# Patient Record
Sex: Female | Born: 1985 | Race: Black or African American | Hispanic: No | Marital: Single | Smoking: Current every day smoker
Health system: Southern US, Community
[De-identification: ages and names within clinical notes are randomized; demographics above are authoritative.]

## PROBLEM LIST (undated history)

## (undated) ENCOUNTER — Ambulatory Visit

## (undated) ENCOUNTER — Ambulatory Visit: Payer: No Typology Code available for payment source | Source: Home / Self Care

## (undated) DIAGNOSIS — R569 Unspecified convulsions: Secondary | ICD-10-CM

## (undated) DIAGNOSIS — F1910 Other psychoactive substance abuse, uncomplicated: Secondary | ICD-10-CM

## (undated) DIAGNOSIS — K2920 Alcoholic gastritis without bleeding: Secondary | ICD-10-CM

## (undated) DIAGNOSIS — K920 Hematemesis: Secondary | ICD-10-CM

## (undated) DIAGNOSIS — F4329 Adjustment disorder with other symptoms: Secondary | ICD-10-CM

## (undated) DIAGNOSIS — E872 Acidosis: Secondary | ICD-10-CM

## (undated) DIAGNOSIS — K259 Gastric ulcer, unspecified as acute or chronic, without hemorrhage or perforation: Secondary | ICD-10-CM

## (undated) DIAGNOSIS — N12 Tubulo-interstitial nephritis, not specified as acute or chronic: Secondary | ICD-10-CM

## (undated) HISTORY — DX: Other psychoactive substance abuse, uncomplicated: F19.10

## (undated) HISTORY — DX: Tubulo-interstitial nephritis, not specified as acute or chronic: N12

## (undated) HISTORY — PX: MOUTH SURGERY: SHX715

## (undated) HISTORY — DX: Adjustment disorder with other symptoms: F43.29

## (undated) HISTORY — DX: Hematemesis: K92.0

## (undated) HISTORY — DX: Alcoholic gastritis without bleeding: K29.20

## (undated) HISTORY — DX: Acidosis: E87.2

## (undated) HISTORY — PX: BREAST LUMPECTOMY: SHX2

---
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
## 2006-02-17 ENCOUNTER — Emergency Department (HOSPITAL_COMMUNITY): Admission: EM | Admit: 2006-02-17 | Discharge: 2006-02-18 | Payer: Self-pay | Admitting: Emergency Medicine

## 2006-06-27 ENCOUNTER — Emergency Department (HOSPITAL_COMMUNITY): Admission: EM | Admit: 2006-06-27 | Discharge: 2006-06-27 | Payer: Self-pay | Admitting: Family Medicine

## 2006-07-14 ENCOUNTER — Emergency Department: Payer: Self-pay | Admitting: Emergency Medicine

## 2007-01-26 ENCOUNTER — Emergency Department (HOSPITAL_COMMUNITY): Admission: EM | Admit: 2007-01-26 | Discharge: 2007-01-26 | Payer: Self-pay | Admitting: Emergency Medicine

## 2007-02-01 ENCOUNTER — Inpatient Hospital Stay (HOSPITAL_COMMUNITY): Admission: AD | Admit: 2007-02-01 | Discharge: 2007-02-01 | Payer: Self-pay | Admitting: Obstetrics and Gynecology

## 2007-04-12 ENCOUNTER — Emergency Department (HOSPITAL_COMMUNITY): Admission: EM | Admit: 2007-04-12 | Discharge: 2007-04-12 | Payer: Self-pay | Admitting: Emergency Medicine

## 2008-02-16 ENCOUNTER — Emergency Department (HOSPITAL_COMMUNITY): Admission: EM | Admit: 2008-02-16 | Discharge: 2008-02-16 | Payer: Self-pay | Admitting: Emergency Medicine

## 2008-02-19 ENCOUNTER — Emergency Department (HOSPITAL_COMMUNITY): Admission: EM | Admit: 2008-02-19 | Discharge: 2008-02-20 | Payer: Self-pay | Admitting: Emergency Medicine

## 2008-08-29 ENCOUNTER — Emergency Department (HOSPITAL_COMMUNITY): Admission: EM | Admit: 2008-08-29 | Discharge: 2008-08-29 | Payer: Self-pay | Admitting: Emergency Medicine

## 2008-12-17 ENCOUNTER — Emergency Department (HOSPITAL_COMMUNITY): Admission: EM | Admit: 2008-12-17 | Discharge: 2008-12-17 | Payer: Self-pay | Admitting: Emergency Medicine

## 2009-01-03 ENCOUNTER — Emergency Department (HOSPITAL_COMMUNITY): Admission: EM | Admit: 2009-01-03 | Discharge: 2009-01-03 | Payer: Self-pay | Admitting: Emergency Medicine

## 2009-01-05 ENCOUNTER — Emergency Department (HOSPITAL_COMMUNITY): Admission: EM | Admit: 2009-01-05 | Discharge: 2009-01-06 | Payer: Self-pay | Admitting: Emergency Medicine

## 2009-07-10 ENCOUNTER — Emergency Department (HOSPITAL_COMMUNITY): Admission: EM | Admit: 2009-07-10 | Discharge: 2009-07-10 | Payer: Self-pay | Admitting: Emergency Medicine

## 2009-07-14 ENCOUNTER — Emergency Department (HOSPITAL_COMMUNITY): Admission: EM | Admit: 2009-07-14 | Discharge: 2009-07-14 | Payer: Self-pay | Admitting: Emergency Medicine

## 2009-12-10 ENCOUNTER — Emergency Department (HOSPITAL_COMMUNITY): Admission: EM | Admit: 2009-12-10 | Discharge: 2009-12-10 | Payer: Self-pay | Admitting: Emergency Medicine

## 2010-03-27 DIAGNOSIS — N12 Tubulo-interstitial nephritis, not specified as acute or chronic: Secondary | ICD-10-CM

## 2010-03-27 HISTORY — DX: Tubulo-interstitial nephritis, not specified as acute or chronic: N12

## 2010-06-10 ENCOUNTER — Emergency Department (HOSPITAL_COMMUNITY): Admission: EM | Admit: 2010-06-10 | Discharge: 2010-06-10 | Payer: Self-pay | Admitting: Emergency Medicine

## 2010-07-22 ENCOUNTER — Emergency Department (HOSPITAL_COMMUNITY): Admission: EM | Admit: 2010-07-22 | Discharge: 2010-07-22 | Payer: Self-pay | Admitting: Emergency Medicine

## 2011-03-13 LAB — DIFFERENTIAL
Basophils Absolute: 0.1 10*3/uL (ref 0.0–0.1)
Basophils Relative: 1 % (ref 0–1)
Eosinophils Absolute: 0.1 10*3/uL (ref 0.0–0.7)
Eosinophils Relative: 2 % (ref 0–5)
Lymphocytes Relative: 29 % (ref 12–46)
Lymphs Abs: 2.1 10*3/uL (ref 0.7–4.0)
Monocytes Absolute: 0.6 10*3/uL (ref 0.1–1.0)
Monocytes Relative: 8 % (ref 3–12)
Neutro Abs: 4.4 10*3/uL (ref 1.7–7.7)
Neutrophils Relative %: 61 % (ref 43–77)

## 2011-03-13 LAB — CBC
HCT: 34 % — ABNORMAL LOW (ref 36.0–46.0)
Hemoglobin: 11.3 g/dL — ABNORMAL LOW (ref 12.0–15.0)
MCH: 32.4 pg (ref 26.0–34.0)
MCHC: 33.3 g/dL (ref 30.0–36.0)
MCV: 97.4 fL (ref 78.0–100.0)
Platelets: 277 10*3/uL (ref 150–400)
RBC: 3.49 MIL/uL — ABNORMAL LOW (ref 3.87–5.11)
RDW: 13.4 % (ref 11.5–15.5)
WBC: 7.3 10*3/uL (ref 4.0–10.5)

## 2011-03-13 LAB — COMPREHENSIVE METABOLIC PANEL
ALT: 13 U/L (ref 0–35)
AST: 19 U/L (ref 0–37)
Albumin: 3.9 g/dL (ref 3.5–5.2)
Alkaline Phosphatase: 65 U/L (ref 39–117)
BUN: 7 mg/dL (ref 6–23)
CO2: 23 mEq/L (ref 19–32)
Calcium: 8.7 mg/dL (ref 8.4–10.5)
Chloride: 107 mEq/L (ref 96–112)
Creatinine, Ser: 0.62 mg/dL (ref 0.4–1.2)
GFR calc Af Amer: >60 mL/min (ref >60)
GFR calc non Af Amer: >60 mL/min (ref >60)
Glucose, Bld: 76 mg/dL (ref 70–99)
Potassium: 3.2 mEq/L — ABNORMAL LOW (ref 3.5–5.1)
Sodium: 137 mEq/L (ref 135–145)
Total Bilirubin: 0.7 mg/dL (ref 0.3–1.2)
Total Protein: 7.2 g/dL (ref 6.0–8.3)

## 2011-03-13 LAB — POCT PREGNANCY, URINE: Preg Test, Ur: NEGATIVE

## 2011-03-30 LAB — URINALYSIS, ROUTINE W REFLEX MICROSCOPIC
Bilirubin Urine: NEGATIVE
Glucose, UA: NEGATIVE mg/dL
Hgb urine dipstick: NEGATIVE
Ketones, ur: NEGATIVE mg/dL
Nitrite: NEGATIVE
Protein, ur: NEGATIVE mg/dL
Specific Gravity, Urine: 1.012 (ref 1.005–1.030)
Urobilinogen, UA: 0.2 mg/dL (ref 0.0–1.0)
pH: 7.5 (ref 5.0–8.0)

## 2011-04-04 LAB — COMPREHENSIVE METABOLIC PANEL
ALT: 20 U/L (ref 0–35)
AST: 22 U/L (ref 0–37)
Albumin: 3.9 g/dL (ref 3.5–5.2)
Alkaline Phosphatase: 67 U/L (ref 39–117)
BUN: 10 mg/dL (ref 6–23)
CO2: 25 mEq/L (ref 19–32)
Calcium: 8.8 mg/dL (ref 8.4–10.5)
Chloride: 106 mEq/L (ref 96–112)
Creatinine, Ser: 0.55 mg/dL (ref 0.4–1.2)
GFR calc Af Amer: >60 mL/min (ref >60)
GFR calc non Af Amer: >60 mL/min (ref >60)
Glucose, Bld: 96 mg/dL (ref 70–99)
Potassium: 3.9 mEq/L (ref 3.5–5.1)
Sodium: 135 mEq/L (ref 135–145)
Total Bilirubin: 0.4 mg/dL (ref 0.3–1.2)
Total Protein: 6.9 g/dL (ref 6.0–8.3)

## 2011-04-04 LAB — PREGNANCY, URINE: Preg Test, Ur: NEGATIVE

## 2011-04-04 LAB — URINALYSIS, ROUTINE W REFLEX MICROSCOPIC
Bilirubin Urine: NEGATIVE
Glucose, UA: NEGATIVE mg/dL
Hgb urine dipstick: NEGATIVE
Ketones, ur: NEGATIVE mg/dL
Nitrite: NEGATIVE
Protein, ur: NEGATIVE mg/dL
Specific Gravity, Urine: 1.028 (ref 1.005–1.030)
Urobilinogen, UA: 0.2 mg/dL (ref 0.0–1.0)
pH: 6.5 (ref 5.0–8.0)

## 2011-04-04 LAB — ACETAMINOPHEN LEVEL: Acetaminophen (Tylenol), Serum: <10 ug/mL — ABNORMAL LOW (ref 10–30)

## 2011-04-12 LAB — COMPREHENSIVE METABOLIC PANEL
ALT: 21 U/L (ref 0–35)
AST: 22 U/L (ref 0–37)
Albumin: 4 g/dL (ref 3.5–5.2)
Albumin: 4.5 g/dL (ref 3.5–5.2)
Alkaline Phosphatase: 56 U/L (ref 39–117)
Alkaline Phosphatase: 71 U/L (ref 39–117)
BUN: 12 mg/dL (ref 6–23)
BUN: 6 mg/dL (ref 6–23)
CO2: 25 mEq/L (ref 19–32)
Calcium: 8.8 mg/dL (ref 8.4–10.5)
Calcium: 9.4 mg/dL (ref 8.4–10.5)
Chloride: 97 mEq/L (ref 96–112)
Creatinine, Ser: 0.77 mg/dL (ref 0.4–1.2)
GFR calc Af Amer: >60 mL/min (ref >60)
GFR calc non Af Amer: >60 mL/min (ref >60)
Glucose, Bld: 76 mg/dL (ref 70–99)
Potassium: 3 mEq/L — ABNORMAL LOW (ref 3.5–5.1)
Potassium: 3.8 mEq/L (ref 3.5–5.1)
Sodium: 136 mEq/L (ref 135–145)
Sodium: 136 mEq/L (ref 135–145)
Total Bilirubin: 1 mg/dL (ref 0.3–1.2)
Total Protein: 6.6 g/dL (ref 6.0–8.3)
Total Protein: 7.8 g/dL (ref 6.0–8.3)

## 2011-04-12 LAB — URINALYSIS, ROUTINE W REFLEX MICROSCOPIC
Glucose, UA: NEGATIVE mg/dL
Glucose, UA: NEGATIVE mg/dL
Ketones, ur: >80 mg/dL — AB
Leukocytes, UA: NEGATIVE
Leukocytes, UA: NEGATIVE
Nitrite: NEGATIVE
Specific Gravity, Urine: 1.021 (ref 1.005–1.030)
Specific Gravity, Urine: 1.041 — ABNORMAL HIGH (ref 1.005–1.030)
pH: 6 (ref 5.0–8.0)
pH: 7 (ref 5.0–8.0)

## 2011-04-12 LAB — DIFFERENTIAL
Basophils Relative: 1 % (ref 0–1)
Lymphocytes Relative: 20 % (ref 12–46)
Lymphs Abs: 1.5 10*3/uL (ref 0.7–4.0)
Lymphs Abs: 2.2 10*3/uL (ref 0.7–4.0)
Monocytes Absolute: 0.3 10*3/uL (ref 0.1–1.0)
Monocytes Absolute: 0.5 10*3/uL (ref 0.1–1.0)
Monocytes Relative: 7 % (ref 3–12)
Monocytes Relative: 7 % (ref 3–12)
Neutro Abs: 2.3 10*3/uL (ref 1.7–7.7)
Neutro Abs: 5.5 10*3/uL (ref 1.7–7.7)

## 2011-04-12 LAB — CBC
HCT: 43.8 % (ref 36.0–46.0)
Hemoglobin: 14.8 g/dL (ref 12.0–15.0)
MCHC: 32.7 g/dL (ref 30.0–36.0)
MCHC: 33.8 g/dL (ref 30.0–36.0)
MCV: 94.5 fL (ref 78.0–100.0)
Platelets: 233 10*3/uL (ref 150–400)
Platelets: 263 10*3/uL (ref 150–400)
RBC: 4.63 MIL/uL (ref 3.87–5.11)
RDW: 13.1 % (ref 11.5–15.5)
RDW: 13.5 % (ref 11.5–15.5)
WBC: 7.7 10*3/uL (ref 4.0–10.5)

## 2011-04-12 LAB — URINE MICROSCOPIC-ADD ON

## 2011-05-14 NOTE — Discharge Summary (Signed)
[name], [name]             ACCOUNT NO.:  [id number]   MEDICAL RECORD NO.:  [id number]          PATIENT TYPE:  INP   LOCATION:  9101                          FACILITY:  [facility]   PHYSICIAN:  Dineen Kid. Rana Snare, M.D.    DATE OF BIRTH:  [birth year]   DATE OF ADMISSION:  11/13/2004  DATE OF DISCHARGE:  11/16/2004                                 DISCHARGE SUMMARY   ADMITTING DIAGNOSES:  1.  Intrauterine pregnancy at 86 and five-sevenths weeks estimated      gestational age.  2.  Transverse lie.  3.  Labor.   DISCHARGE DIAGNOSES:  1.  Status post low transverse cesarean section.  2.  Viable female infant.   PROCEDURE:  Primary low transverse cesarean section.   REASON FOR ADMISSION:  Please see written H&P.   HOSPITAL COURSE:  The patient was a 25 year old single black female  primigravida that presented to [facility] with right lower  quadrant and right flank pain.  The patient was found to be contracting  regularly.  PIH panel was drawn which was within normal limits.  Ultrasound  was performed which revealed a biophysical profile 8/8.  Fetal heart tones  revealed some accelerations but also noted to have some decelerations which  seemed to appear to be late in the contraction cycle.  Cervix was examined  which revealed a cervix that was closed, thick, and high.  Ultrasound also  revealed the fetus to be in the transverse lie position.  Decision was made  to proceed with a cesarean delivery.  The patient was then transferred to  the operating room where spinal anesthesia was administered without  difficulty.  A low transverse incision was made with the delivery of a  viable female infant weighing 6 pounds 0 ounces with Apgars of 7 at one minute  and 8 at five minutes.  Arterial cord pH was 7.11.  The patient tolerated  the procedure well and was taken to the recovery room in stable condition.  On postoperative day #1, the patient was without complaint.  Vital signs  were stable, she was afebrile.  Abdomen was soft, fundus was firm and  nontender.  Abdominal dressing was noted to be clean, dry, and intact.  Labs  revealed hemoglobin of 8.4 and wbc count of 13.3.  On postoperative day #2,  the patient remained afebrile.  Vital signs were stable.  Abdomen was soft.  Abdominal dressing had been removed revealing an incision that was clean,  dry, and intact.  She was ambulating well with good return of bowel  function.  On postoperative day #3, the patient was without complaint.  The  baby was in the NICU on CPAP.  Vital signs were stable.  Abdomen soft,  fundus was firm and nontender.  Incision was clean, dry, and intact.  Staples were removed and the patient was discharged home.   CONDITION ON DISCHARGE:  Good.   DIET:  Regular as tolerated.   ACTIVITY:  No heavy lifting, no driving x2 weeks, no vaginal entry.   FOLLOW-UP:  The patient is to follow up in the  office in 1 week for an  incision check.  She is to call for temperature greater than 100 degrees,  persistent nausea and vomiting, heavy vaginal bleeding, and/or redness or  drainage from the incisional site.   DISCHARGE MEDICATIONS:  1.  Tylox #30 one p.o. q.4-6h. p.r.n.  2.  Motrin 600 mg q.6h.  3.  Prenatal vitamins one p.o. daily.  4.  Colace one p.o. daily p.r.n.     Catalina Gravel   CC/MEDQ  D:  12/04/2004  T:  12/04/2004  Job:  [id number]

## 2011-05-14 NOTE — Op Note (Signed)
[name], [name]             ACCOUNT NO.:  [id number]   MEDICAL RECORD NO.:  [id number]          PATIENT TYPE:  INP   LOCATION:  9101                          [facility]ACILITY:  [facility]   PHYSICIAN:  Guy Sandifer. Tomblin II, M.D.DATE OF BIRTH:  03-Oct-1986   DATE OF PROCEDURE:  11/13/2004  DATE OF DISCHARGE:                                 OPERATIVE REPORT   PREOPERATIVE DIAGNOSES:  1.  Intrauterine pregnancy at 36 and five-sevenths weeks.  2.  Transverse lie.  3.  Labor.   POSTOPERATIVE DIAGNOSES:  1.  Intrauterine pregnancy at 36 and five-sevenths weeks.  2.  Transverse lie.  3.  Labor.   PROCEDURE:  1.  Low transverse cesarean section.  2.  ON-Q subfascial and subcutaneous.   SURGEON:  Harold Hedge, M.D.   ANESTHESIA:  Spinal - Cristela Blue M.D.   ESTIMATED BLOOD LOSS:  800 mL.   FINDINGS:  Viable female infant, Apgars of 7 and 9 at one and five minutes  respectively, birth weight pending, arterial cord pH 7.11.   INDICATIONS AND CONSENT:  This patient is a 25 year old single black female  G1 P0 with an EDC of December 05, 2004 who had a positive group B beta strep  this pregnancy.  She presents to triage with complaints of right lower  quadrant and right flank pain.  She is found to be contracting regularly.  PIH panel is normal.  Ultrasound with BPP is 8/8.  Fetal heart tones reveal  accelerations with some decelerations, some of which appear to be late.  Cervix is closed, thick, and high and ultrasound reveals transverse lie.  Diagnosis of labor with transverse lie is made and recommendation for  cesarean section is made.  Potential risks and complications are discussed  with the patient and her mother including but not limited to:  Infection;  bowel, bladder, ureteral damage; bleeding requiring transfusion or blood  products and possible transfusion reaction, HIV, and hepatitis acquisition;  DVT; PE; pneumonia.  All questions are answered and consent is signed on the   chart.   DESCRIPTION OF PROCEDURE:  The patient is taken to the operating room where  she is identified, spinal anesthetic was placed, and she is placed in the  dorsal supine position with a 15-degree left lateral wedge.  She is prepped  abdominally, Foley catheter is placed and the bladder is drained, and she is  draped in a sterile fashion.  After testing for adequate spinal anesthesia,  skin is entered through a Pfannenstiel incision and dissection is carried  out in layers to the peritoneum.  The peritoneum is incised, extended  superiorly and inferiorly.  The vesicouterine peritoneum is taken down  cephalolaterally, the bladder flap is developed, and bladder blade is  placed.  The uterus is incised in a low transverse manner and the uterine  cavity is entered bluntly with a hemostat.  The uterine incisions have been  extended cephalolaterally with the fingers.  Artificial rupture of membranes  reveals clear fluid.  The baby is delivered via the vertex.  Oronasopharynx  are suctioned.  The remainder of the baby is  delivered.  Cry and tone is  noted, the cord is clamped and cut, and the baby is handed to the waiting  pediatrics team.  The placenta is manually delivered and sent to pathology.  The uterine cavity is cleaned.  The uterus is closed in two running locking  imbricating layers of 0 Monocryl suture.  Good hemostasis is obtained.  Tubes and ovaries are normal.  Anterior peritoneum is closed in a running  fashion with 0 Monocryl which is also used to reapproximate the pyramidalis  muscle in the midline.  The ON-Q cannula is then placed subfascially and  later subcutaneously.  The subfascial port exits the left upper incision,  the subfascial exits the right upper portion of the incision.  The fascia is  then closed in a running fashion with a 0 PDS suture and the skin is closed  with clips.  All counts are correct.  The patient is taken to the recovery  room in stable  condition.      JET/MEDQ  D:  11/13/2004  T:  11/14/2004  Job:  [id number]

## 2011-05-14 NOTE — Discharge Summary (Signed)
[name], [name]             ACCOUNT NO.:  [id number]   MEDICAL RECORD NO.:  [id number]          PATIENT TYPE:  INP   LOCATION:  9159                          [facility]ILITY:  [facility]   PHYSICIAN:  Juluis Mire, M.D.   DATE OF BIRTH:  1986/01/04   DATE OF ADMISSION:  10/28/2004  DATE OF DISCHARGE:                                 DISCHARGE SUMMARY   ADMITTING DIAGNOSIS:  Intrauterine pregnancy at 59 and five-sevenths with  preterm uterine activity.   DISCHARGE DIAGNOSES:  Intrauterine pregnancy at 5 and five-sevenths with  preterm uterine activity.   For complete history and physical, please see dictated note.   COURSE IN THE HOSPITAL:  The patient was brought in and begun on magnesium  sulfate with resolution of uterine activity.  On October 29, 2004 the  magnesium was weaned and she was placed on p.o. terbutaline and watched  overnight.  The next day, no uterine activity was appreciated.  The cervix  remained long and closed and posterior.  Fetal heart was reactive with no  decelerations.  It was noted that she did have a positive group B strep  screen and has been placed IV Unasyn.  Ultrasound will be obtained.  If no  concerns are raised with the ultrasound, the patient will be discharged  home.   COMPLICATIONS:  None encountered during stay in the hospital.  The patient  was discharged home in stable condition.   DISPOSITION:  She will continue rest at home, p.o. terbutaline, and complete  a course of amoxicillin.  Report increase in uterine activity.  Follow up in  the office on Monday.      JSM/MEDQ  D:  10/30/2004  T:  10/30/2004  Job:  [id number]

## 2011-05-14 NOTE — H&P (Signed)
[name], [name]             ACCOUNT NO.:  [id number]   MEDICAL RECORD NO.:  [id number]          PATIENT TYPE:  INP   LOCATION:  9159                          [facility]   PHYSICIAN:  Dineen Kid. Rana Snare, M.D.    DATE OF BIRTH:  18-Nov-1986   DATE OF ADMISSION:  10/28/2004  DATE OF DISCHARGE:                                HISTORY & PHYSICAL   HISTORY OF PRESENT ILLNESS:  Ms. Dessert is a 25 year old, G1 at 67 5/7 weeks  who presented to the office today complaining of contractions which do not  hurt though coming at 5-10 minute intervals. Her cervix was dilated to 1 1/2  cm, 30% effaced, she was sent to [facility] for further evaluation of  preterm labor. She received 2 subcu injections of terbutaline 0.25 mg with  some slowing of the contractions but continued contracting 5-10 minute  intervals. No further cervical change but because she has continued to  contract and had cervical change, she was admitted for magnesium sulfate  tocolysis. Her EDC is December 05, 2004, her pregnancy complicated only by  teenage pregnancy and late prenatal care presenting at approximately [redacted]  weeks pregnant.   PAST MEDICAL HISTORY:  Negative.   PAST SURGICAL HISTORY:  She has had a breast lump removed.   MEDICATIONS:  Prenatal vitamins. She has no known drug allergies.   PHYSICAL EXAMINATION:  VITAL SIGNS:  Her blood pressure was 98/56.  HEART:  Regular rate and rhythm.  LUNGS:  Clear to auscultation bilaterally.  ABDOMEN:  Gravid, nontender, 34 cm in height, cervix is 1 1/2, 30%, -3  station, group B strep culture was performed.   IMPRESSION:  Intrauterine pregnancy at 41 4/7 with preterm labor and  cervical change not responsive to conservative therapy with outpatient  subcutaneous terbutaline.   PLAN:  Magnesium sulfate and possible institution of oral tocolytics.  Await  the results of the group B strep. Because she is greater than 34 weeks,  betamethasone was not  offered.      DCL/MEDQ  D:  10/29/2004  T:  10/29/2004  Job:  [id number]

## 2011-09-17 LAB — URINE MICROSCOPIC-ADD ON

## 2011-09-17 LAB — CBC
HCT: 37.3
HCT: 42.6
Hemoglobin: 12.6
Hemoglobin: 14.5
MCHC: 33.8
MCHC: 34.1
MCV: 92.5
MCV: 92.8
Platelets: 294
Platelets: 303
RBC: 4.61
RDW: 13.9
RDW: 14.2
WBC: 5.8

## 2011-09-17 LAB — URINALYSIS, ROUTINE W REFLEX MICROSCOPIC
Glucose, UA: NEGATIVE
Hgb urine dipstick: NEGATIVE
Ketones, ur: >80 — AB
Leukocytes, UA: NEGATIVE
Nitrite: NEGATIVE
Specific Gravity, Urine: 1.022
Specific Gravity, Urine: 1.042 — ABNORMAL HIGH
Urobilinogen, UA: 1
pH: 6
pH: 7

## 2011-09-17 LAB — COMPREHENSIVE METABOLIC PANEL
ALT: 15
AST: 30
Alkaline Phosphatase: 53
BUN: 7
CO2: 25
Calcium: 8.8
Calcium: 9.5
Creatinine, Ser: 0.69
Creatinine, Ser: 0.78
GFR calc Af Amer: >60
GFR calc non Af Amer: >60
Glucose, Bld: 74
Glucose, Bld: 86
Potassium: 3 — ABNORMAL LOW
Sodium: 137
Total Protein: 7
Total Protein: 8

## 2011-09-17 LAB — DIFFERENTIAL
Basophils Relative: 2 — ABNORMAL HIGH
Lymphocytes Relative: 26
Lymphocytes Relative: 46
Lymphs Abs: 1.5
Monocytes Relative: 6
Monocytes Relative: 8
Neutro Abs: 2.2
Neutrophils Relative %: 43
Neutrophils Relative %: 66

## 2011-09-17 LAB — LIPASE, BLOOD: Lipase: 16

## 2011-09-17 LAB — POCT CARDIAC MARKERS
CKMB, poc: 1.1
Myoglobin, poc: 60.4
Operator id: 4295
Operator id: 4708
Troponin i, poc: <0.05

## 2011-09-17 LAB — POCT PREGNANCY, URINE
Operator id: 29727
Preg Test, Ur: NEGATIVE

## 2011-09-17 LAB — OCCULT BLOOD X 1 CARD TO LAB, STOOL: Fecal Occult Bld: POSITIVE

## 2011-09-29 LAB — DIFFERENTIAL
Eosinophils Absolute: 0
Eosinophils Relative: 0
Lymphocytes Relative: 3 — ABNORMAL LOW
Lymphs Abs: 0.5 — ABNORMAL LOW
Monocytes Relative: 6

## 2011-09-29 LAB — CBC
MCHC: 32.6
Platelets: 247
RBC: 3.89
RDW: 14.1

## 2011-09-29 LAB — URINALYSIS, ROUTINE W REFLEX MICROSCOPIC
Nitrite: POSITIVE — AB
Specific Gravity, Urine: 1.018
Urobilinogen, UA: 0.2

## 2011-09-29 LAB — COMPREHENSIVE METABOLIC PANEL
ALT: 13
AST: 18
Albumin: 3.7
Calcium: 9
Creatinine, Ser: 0.84
GFR calc Af Amer: >60
Sodium: 138
Total Protein: 6.8

## 2011-09-29 LAB — URINE MICROSCOPIC-ADD ON

## 2011-09-30 ENCOUNTER — Emergency Department (HOSPITAL_COMMUNITY)
Admission: EM | Admit: 2011-09-30 | Discharge: 2011-09-30 | Disposition: A | Discharge status: Home / Self Care | Payer: Self-pay | Attending: Otolaryngology | Admitting: Otolaryngology

## 2011-09-30 DIAGNOSIS — R059 Cough, unspecified: Secondary | ICD-10-CM | POA: Insufficient documentation

## 2011-09-30 DIAGNOSIS — R062 Wheezing: Secondary | ICD-10-CM | POA: Insufficient documentation

## 2011-09-30 DIAGNOSIS — R05 Cough: Secondary | ICD-10-CM | POA: Insufficient documentation

## 2011-11-05 ENCOUNTER — Emergency Department (HOSPITAL_COMMUNITY)
Admission: EM | Admit: 2011-11-05 | Discharge: 2011-11-06 | Disposition: A | Discharge status: Home / Self Care | Payer: Self-pay | Attending: Emergency Medicine | Admitting: Emergency Medicine

## 2011-11-05 ENCOUNTER — Encounter: Payer: Self-pay | Admitting: *Deleted

## 2011-11-05 DIAGNOSIS — F17200 Nicotine dependence, unspecified, uncomplicated: Secondary | ICD-10-CM | POA: Insufficient documentation

## 2011-11-05 DIAGNOSIS — F329 Major depressive disorder, single episode, unspecified: Secondary | ICD-10-CM | POA: Insufficient documentation

## 2011-11-05 DIAGNOSIS — F3289 Other specified depressive episodes: Secondary | ICD-10-CM | POA: Insufficient documentation

## 2011-11-05 DIAGNOSIS — R000 Tachycardia, unspecified: Secondary | ICD-10-CM | POA: Insufficient documentation

## 2011-11-05 DIAGNOSIS — F1010 Alcohol abuse, uncomplicated: Secondary | ICD-10-CM | POA: Insufficient documentation

## 2011-11-05 HISTORY — DX: Gastric ulcer, unspecified as acute or chronic, without hemorrhage or perforation: K25.9

## 2011-11-05 LAB — CBC
HCT: 38 % (ref 36.0–46.0)
Hemoglobin: 13.2 g/dL (ref 12.0–15.0)
MCHC: 34.7 g/dL (ref 30.0–36.0)
MCV: 92.5 fL (ref 78.0–100.0)
RDW: 13.6 % (ref 11.5–15.5)

## 2011-11-05 LAB — COMPREHENSIVE METABOLIC PANEL
Albumin: 4.3 g/dL (ref 3.5–5.2)
Alkaline Phosphatase: 110 U/L (ref 39–117)
BUN: 11 mg/dL (ref 6–23)
Chloride: 100 mEq/L (ref 96–112)
Creatinine, Ser: 0.68 mg/dL (ref 0.50–1.10)
GFR calc Af Amer: >90 mL/min (ref >90)
Glucose, Bld: 86 mg/dL (ref 70–99)
Potassium: 3.9 mEq/L (ref 3.5–5.1)
Total Bilirubin: 0.4 mg/dL (ref 0.3–1.2)
Total Protein: 8 g/dL (ref 6.0–8.3)

## 2011-11-05 LAB — RAPID URINE DRUG SCREEN, HOSP PERFORMED
Amphetamines: NOT DETECTED
Opiates: NOT DETECTED

## 2011-11-05 LAB — POCT PREGNANCY, URINE: Preg Test, Ur: NEGATIVE

## 2011-11-05 LAB — ETHANOL: Alcohol, Ethyl (B): <11 mg/dL (ref 0–11)

## 2011-11-05 MED ORDER — FOLIC ACID 1 MG PO TABS
1.0000 mg | ORAL_TABLET | Freq: Every day | ORAL | Status: DC
  Administered 2011-11-05: 1 mg via ORAL
  Filled 2011-11-05: qty 1

## 2011-11-05 MED ORDER — LORAZEPAM 1 MG PO TABS: 0.0000 mg | ORAL_TABLET | Freq: Two times a day (BID) | ORAL | Status: DC

## 2011-11-05 MED ORDER — LORAZEPAM 1 MG PO TABS
1.0000 mg | ORAL_TABLET | Freq: Four times a day (QID) | ORAL | Status: DC | PRN
  Administered 2011-11-06: 1 mg via ORAL
  Filled 2011-11-05: qty 1

## 2011-11-05 MED ORDER — LORAZEPAM 1 MG PO TABS
0.0000 mg | ORAL_TABLET | Freq: Four times a day (QID) | ORAL | Status: DC
  Administered 2011-11-05: 2 mg via ORAL
  Filled 2011-11-05: qty 2

## 2011-11-05 MED ORDER — LORAZEPAM 1 MG PO TABS
2.0000 mg | ORAL_TABLET | Freq: Once | ORAL | Status: AC
  Administered 2011-11-05: 2 mg via ORAL
  Filled 2011-11-05: qty 2

## 2011-11-05 MED ORDER — THIAMINE HCL 100 MG/ML IJ SOLN: 100.0000 mg | Freq: Every day | INTRAMUSCULAR | Status: DC

## 2011-11-05 MED ORDER — VITAMIN B-1 100 MG PO TABS
100.0000 mg | ORAL_TABLET | Freq: Every day | ORAL | Status: DC
  Administered 2011-11-05: 100 mg via ORAL
  Filled 2011-11-05: qty 1

## 2011-11-05 MED ORDER — LORAZEPAM 2 MG/ML IJ SOLN: 1.0000 mg | Freq: Four times a day (QID) | INTRAMUSCULAR | Status: DC | PRN

## 2011-11-05 MED ORDER — ACETAMINOPHEN 325 MG PO TABS
ORAL_TABLET | ORAL | Status: AC
  Administered 2011-11-05: 650 mg
  Filled 2011-11-05: qty 2

## 2011-11-05 MED ORDER — THERA M PLUS PO TABS
1.0000 | ORAL_TABLET | Freq: Every day | ORAL | Status: DC
  Administered 2011-11-05: 1 via ORAL
  Filled 2011-11-05: qty 1

## 2011-11-05 NOTE — ED Notes (Signed)
Process explained process, given paper scrubs, informed labs to be drawn & urine collected, mother to take possessions home.

## 2011-11-05 NOTE — ED Notes (Signed)
Pt. Ambulated to psych ed rm 36 without any difficultly.  Pt. Pleasant and cooperative while being oriented to psych unit, pt. Verbalized understanding.  Pt. States she came to the hospital to get help with detoxing from ETOH.  Denies S/H/I, A/V/H.  States that she has had a drinking problem for 5 years and drinks every night after work at 10pm until she passes out.  Reports that she drank 3 40oz beer along with 2 shots.  Reports this is not as much as she usually drinks.

## 2011-11-05 NOTE — ED Notes (Signed)
Report given to Victorino Dike, RN,

## 2011-11-05 NOTE — ED Notes (Signed)
Pt woke w/sheets and blue scrubs drenched in sweat, nausea, obvious tremors w/arms crossed, sensitivity to light, pain behind bil. Eyes, and severe headache. Linens and scrubs changed, VS obtained, and CIWA protocol followed, will monitor in 1 hr per protocol.

## 2011-11-05 NOTE — BH Assessment (Signed)
Assessment Note   [name] is an 25 y.o. female. Patient reported that she came to the hospital requesting detox from alcohol. She reports that she drinking daily as much as she can. When arrived at ED she reported to EDP that she drinks 2-40oz daily. Patient reports using marijuana daily.  Patient denies any HI or delusions. She reports that she sees 32 moneys on her bed while in the assessment. Patient denies any depression. She has no history of hospitalization or detox programs. Patient reports that she has been stressed due to her needing to move form her home that has mold and does not have the money. She reports that she lives with relatives, but staff believes that she may be homeless. It was reported by patient that she works 2 full-time jobs, but would not provide further detail. She endorse withdrawal symptoms such as nauseas and chills.   EDP requested that patient is referred to telepsych. All referral information have been sent over for the assessment to be completed.   Axis I: Alcohol Abuse Axis II: Deferred Axis III:  Past Medical History  Diagnosis Date  . Stomach ulcer    Axis IV: economic problems and housing problems Axis V: 31-40 impairment in reality testing  Past Medical History:  Past Medical History  Diagnosis Date  . Stomach ulcer     Past Surgical History  Procedure Date  . Breast lumpectomy     right  . Cesarean section 2005    Family History: No family history on file.  Social History:  reports that she has been smoking.  She does not have any smokeless tobacco history on file. She reports that she drinks alcohol. She reports that she does not use illicit drugs.  Allergies: No Known Allergies  Home Medications:  Medications Prior to Admission  Medication Dose Route Frequency Provider Last Rate Last Dose  . acetaminophen (TYLENOL) 325 MG tablet        650 mg at 11/05/11 1807  . folic acid (FOLVITE) tablet 1 mg  1 mg Oral Daily Toy Baker, MD   1 mg at 11/05/11 1804  . LORazepam (ATIVAN) tablet 1 mg  1 mg Oral Q6H PRN Toy Baker, MD       Or  . LORazepam (ATIVAN) injection 1 mg  1 mg Intravenous Q6H PRN Toy Baker, MD      . LORazepam (ATIVAN) tablet 0-4 mg  0-4 mg Oral Q6H Toy Baker, MD   2 mg at 11/05/11 1805   Followed by  . LORazepam (ATIVAN) tablet 0-4 mg  0-4 mg Oral Q12H Toy Baker, MD      . LORazepam (ATIVAN) tablet 2 mg  2 mg Oral Once Toy Baker, MD   2 mg at 11/05/11 1401  . multivitamins ther. w/minerals tablet 1 tablet  1 tablet Oral Daily Toy Baker, MD   1 tablet at 11/05/11 1615  . thiamine (VITAMIN B-1) tablet 100 mg  100 mg Oral Daily Toy Baker, MD   100 mg at 11/05/11 1807   Or  . thiamine (B-1) injection 100 mg  100 mg Intravenous Daily Toy Baker, MD       No current outpatient prescriptions on file as of 11/05/2011.    OB/GYN Status:  Patient's last menstrual period was 10/30/2011.  General Assessment Data Living Arrangements: Relatives Can pt return to current living arrangement?: Yes Admission Status: Voluntary Is patient capable of signing voluntary  admission?: Yes Transfer from: Home Referral Source: MD  Risk to self Suicidal Ideation: No Suicidal Intent: No Is patient at risk for suicide?: No Suicidal Plan?: No Access to Means: No What has been your use of drugs/alcohol within the last 12 months?: n/a Other Self Harm Risks:  (none) Triggers for Past Attempts: Other (Comment) (none) Intentional Self Injurious Behavior: None Factors that decrease suicide risk: Sense of responsibility to family Family Suicide History: No Recent stressful life event(s): Other (Comment) ( trying to move due to conflict at home no means) Persecutory voices/beliefs?: No Depression: No Substance abuse history and/or treatment for substance abuse?: Yes Suicide prevention information given to non-admitted patients: Not applicable  Risk to Others Homicidal  Ideation: No Thoughts of Harm to Others: No Current Homicidal Intent: No Current Homicidal Plan: No Access to Homicidal Means: No Identified Victim:  (none) History of harm to others?: No Assessment of Violence: None Noted Violent Behavior Description:  (none noted) Does patient have access to weapons?: No Criminal Charges Pending?: No Does patient have a court date: No  Mental Status Report Appear/Hygiene: Other (Comment) (appropraite) Eye Contact: Poor Motor Activity: Freedom of movement Speech: Logical/coherent Level of Consciousness: Crying Mood: Empty Affect: Appropriate to circumstance Anxiety Level: Minimal Thought Processes: Coherent;Relevant Judgement: Unimpaired Orientation: Person;Time;Situation;Place Obsessive Compulsive Thoughts/Behaviors: None  Cognitive Functioning Concentration: Normal Memory: Recent Intact;Remote Intact IQ: Average Insight: Fair Impulse Control: Poor Appetite: Fair Weight Loss:  (none) Weight Gain:  (none) Sleep: No Change Total Hours of Sleep: 7  Vegetative Symptoms: None  Prior Inpatient/Outpatient Therapy Prior Therapy:  (none) Prior Therapy Dates:  (n/a) Prior Therapy Facilty/Provider(s):  (n/a) Reason for Treatment:  (none)          Abuse/Neglect Assessment (Assessment to be complete while patient is alone) Physical Abuse: Denies Verbal Abuse: Denies Sexual Abuse: Denies Exploitation of patient/patient's resources: Denies Self-Neglect: Denies Values / Beliefs Cultural Requests During Hospitalization: None Spiritual Requests During Hospitalization: None Consults Spiritual Care Consult Needed: No Social Work Consult Needed: No      Additional Information 1:1 In Past 12 Months?:  (none) CIRT Risk: No Elopement Risk: No Does patient have medical clearance?: Yes  Child/Adolescent Assessment Running Away Risk: Denies Bed-Wetting: Denies Destruction of Property: Denies Cruelty to Animals: Denies Stealing:  Denies Rebellious/Defies Authority: Denies Satanic Involvement: Denies Archivist: Denies Problems at [employer]: Denies Gang Involvement: Denies  Disposition:  Disposition Disposition of Patient: Referred to (pt was referred for a telepsych per EDP) Patient referred to: Other (Comment) (referred for telepsych)  On Site Evaluation by:   Reviewed with Physician:     Shara Blazing [facility] 11/05/2011 10:46 PM

## 2011-11-05 NOTE — ED Notes (Signed)
Patient given more scrubs and she asked if i can tell nurse she is dizzy

## 2011-11-05 NOTE — ED Notes (Signed)
Please see paper chart CIWA

## 2011-11-05 NOTE — ED Notes (Signed)
Pt states "my mother brought me here, I started drinking heavy @ 5, I've tried to quit by myself, I talked to [facility] today but told no openings for a female"

## 2011-11-05 NOTE — ED Notes (Signed)
ACT team Shaterricka at bedside to eval pt. 

## 2011-11-05 NOTE — ED Provider Notes (Signed)
History     CSN: [id number] Arrival date & time: 11/05/2011 11:54 AM   First MD Initiated Contact with Patient 11/05/11 1333      Chief Complaint  Patient presents with  . Alcohol Problem    (Consider location/radiation/quality/duration/timing/severity/associated sxs/prior treatment) Patient is a 25 y.o. female presenting with alcohol problem. The history is provided by the patient and a parent.  Alcohol Problem   pt requesting detox from alcohol use patient normally uses approximately one to 2 40 ounce beers a day. Patient denies suicidal or homicidal ideations. Patient denies any auditory or visual hallucinations. Last use was approximately 10 hours ago does note some tremors now, denies any history of DTs in the past. No abdominal pain vomiting or change in her stools. Patient called a rehabilitation facility prior to arrival and told that they were not any female beds for admission. Patient does admit to some depression Past Medical History  Diagnosis Date  . Stomach ulcer     Past Surgical History  Procedure Date  . Breast lumpectomy     right  . Cesarean section 2005    No family history on file.  History  Substance Use Topics  . Smoking status: Current Everyday Smoker -- 2.0 packs/day  . Smokeless tobacco: Not on file  . Alcohol Use: Yes     couple of 40's every day & before work, drink liquor after work with more 40's, when off drink all day    OB History    Grav Para Term Preterm Abortions TAB SAB Ect Mult Living                  Review of Systems  All other systems reviewed and are negative.    Allergies  Review of patient's allergies indicates no known allergies.  Home Medications  No current outpatient prescriptions on file.  BP 131/73  Pulse 106  Temp(Src) 98.8 F (37.1 C) (Oral)  Resp 24  Wt 118 lb (53.524 kg)  SpO2 100%  LMP 10/30/2011  Physical Exam  Nursing note and vitals reviewed. Constitutional: She is oriented to person, place,  and time. Vital signs are normal. She appears well-developed and well-nourished.  Non-toxic appearance. No distress.  HENT:  Head: Normocephalic and atraumatic.  Eyes: Conjunctivae and EOM are normal. Pupils are equal, round, and reactive to light.  Neck: Normal range of motion. Neck supple. No tracheal deviation present.  Cardiovascular: Regular rhythm and normal heart sounds.  Tachycardia present.  Exam reveals no gallop.   No murmur heard. Pulmonary/Chest: Effort normal and breath sounds normal. No stridor. No respiratory distress. She has no wheezes.  Abdominal: Soft. Normal appearance and bowel sounds are normal. She exhibits no distension. There is no tenderness. There is no rebound.  Musculoskeletal: Normal range of motion. She exhibits no edema and no tenderness.  Neurological: She is alert and oriented to person, place, and time. She has normal strength. No cranial nerve deficit or sensory deficit. GCS eye subscore is 4. GCS verbal subscore is 5. GCS motor subscore is 6.  Skin: Skin is warm and dry.  Psychiatric: Her speech is normal and behavior is normal. Her affect is blunt. She exhibits a depressed mood.    ED Course  Procedures (including critical care time)   Labs Reviewed  CBC  URINE RAPID DRUG SCREEN (HOSP PERFORMED)  POCT PREGNANCY, URINE  COMPREHENSIVE METABOLIC PANEL  ETHANOL  POCT PREGNANCY, URINE   No results found.   No diagnosis found.  MDM  Spoke with at team and they will assist the patient with placement for alcohol abuse        Toy Baker, MD 11/09/11 1512

## 2011-11-06 NOTE — BH Assessment (Signed)
Assessment Note   [name] is an 25 y.o. female. Pt was referred for a telepsych assessment and it was recommended that she is discharged home with referrals with outpatient referrals. Writer consulted with EDP and ED staff. See attached telepsych assessment for further information.   Axis I: Alcohol Abuse Axis II: Deferred Axis III:  Past Medical History  Diagnosis Date  . Stomach ulcer    Axis IV: economic problems and housing problems Axis V: 41-50 serious symptoms  Past Medical History:  Past Medical History  Diagnosis Date  . Stomach ulcer     Past Surgical History  Procedure Date  . Breast lumpectomy     right  . Cesarean section 2005    Family History: No family history on file.  Social History:  reports that she has been smoking.  She does not have any smokeless tobacco history on file. She reports that she drinks alcohol. She reports that she does not use illicit drugs.  Allergies: No Known Allergies  Home Medications:  Medications Prior to Admission  Medication Dose Route Frequency Provider Last Rate Last Dose  . acetaminophen (TYLENOL) 325 MG tablet        650 mg at 11/05/11 1807  . folic acid (FOLVITE) tablet 1 mg  1 mg Oral Daily Toy Baker, MD   1 mg at 11/05/11 1804  . LORazepam (ATIVAN) tablet 1 mg  1 mg Oral Q6H PRN Toy Baker, MD   1 mg at 11/06/11 0030   Or  . LORazepam (ATIVAN) injection 1 mg  1 mg Intravenous Q6H PRN Toy Baker, MD      . LORazepam (ATIVAN) tablet 0-4 mg  0-4 mg Oral Q6H Toy Baker, MD   2 mg at 11/05/11 1805   Followed by  . LORazepam (ATIVAN) tablet 0-4 mg  0-4 mg Oral Q12H Toy Baker, MD      . LORazepam (ATIVAN) tablet 2 mg  2 mg Oral Once Toy Baker, MD   2 mg at 11/05/11 1401  . multivitamins ther. w/minerals tablet 1 tablet  1 tablet Oral Daily Toy Baker, MD   1 tablet at 11/05/11 1615  . thiamine (VITAMIN B-1) tablet 100 mg  100 mg Oral Daily Toy Baker, MD   100 mg at  11/05/11 1807   Or  . thiamine (B-1) injection 100 mg  100 mg Intravenous Daily Toy Baker, MD       No current outpatient prescriptions on file as of 11/05/2011.    OB/GYN Status:  Patient's last menstrual period was 10/30/2011.  General Assessment Data Living Arrangements: Relatives Can pt return to current living arrangement?: Yes Admission Status: Voluntary Is patient capable of signing voluntary admission?: Yes Transfer from: Home Referral Source: MD  Risk to self Suicidal Ideation: No Suicidal Intent: No Is patient at risk for suicide?: No Suicidal Plan?: No Access to Means: No What has been your use of drugs/alcohol within the last 12 months?: n/a Other Self Harm Risks:  (none) Triggers for Past Attempts: Other (Comment) (none) Intentional Self Injurious Behavior: None Factors that decrease suicide risk: Sense of responsibility to family Family Suicide History: No Recent stressful life event(s): Other (Comment) ( trying to move due to conflict at home no means) Persecutory voices/beliefs?: No Depression: No Substance abuse history and/or treatment for substance abuse?: Yes Suicide prevention information given to non-admitted patients: Not applicable  Risk to Others Homicidal Ideation: No Thoughts of Harm to  Others: No Current Homicidal Intent: No Current Homicidal Plan: No Access to Homicidal Means: No Identified Victim:  (none) History of harm to others?: No Assessment of Violence: None Noted Violent Behavior Description:  (none noted) Does patient have access to weapons?: No Criminal Charges Pending?: No Does patient have a court date: No  Mental Status Report Appear/Hygiene: Other (Comment) (appropraite) Eye Contact: Poor Motor Activity: Freedom of movement Speech: Logical/coherent Level of Consciousness: Crying Mood: Empty Affect: Appropriate to circumstance Anxiety Level: Minimal Thought Processes: Coherent;Relevant Judgement:  Unimpaired Orientation: Person;Time;Situation;Place Obsessive Compulsive Thoughts/Behaviors: None  Cognitive Functioning Concentration: Normal Memory: Recent Intact;Remote Intact IQ: Average Insight: Fair Impulse Control: Poor Appetite: Fair Weight Loss:  (none) Weight Gain:  (none) Sleep: No Change Total Hours of Sleep: 7  Vegetative Symptoms: None  Prior Inpatient/Outpatient Therapy Prior Therapy:  (none) Prior Therapy Dates:  (n/a) Prior Therapy Facilty/Provider(s):  (n/a) Reason for Treatment:  (none)          Abuse/Neglect Assessment (Assessment to be complete while patient is alone) Physical Abuse: Denies Verbal Abuse: Denies Sexual Abuse: Denies Exploitation of patient/patient's resources: Denies Self-Neglect: Denies Values / Beliefs Cultural Requests During Hospitalization: None Spiritual Requests During Hospitalization: None Consults Spiritual Care Consult Needed: No Social Work Consult Needed: No      Additional Information 1:1 In Past 12 Months?:  (none) CIRT Risk: No Elopement Risk: No Does patient have medical clearance?: Yes  Child/Adolescent Assessment Running Away Risk: Denies Bed-Wetting: Denies Destruction of Property: Denies Cruelty to Animals: Denies Stealing: Denies Rebellious/Defies Authority: Denies Satanic Involvement: Denies Archivist: Denies Problems at [employer]: Denies Gang Involvement: Denies  Disposition:  Disposition Disposition of Patient: Referred to (pt was referred for a telepsych per EDP) Patient referred to: Other (Comment) (referred for telepsych)  On Site Evaluation by:   Reviewed with Physician:     Shara Blazing [facility] 11/06/2011 12:33 AM

## 2011-11-06 NOTE — ED Notes (Signed)
Evaluated by ACT, no indication for admit, stable for d.c home and outpatient resources for follow up. No SI/ HI  Deborah Nielsen, MD 11/06/11 [phone]

## 2012-01-05 ENCOUNTER — Emergency Department (HOSPITAL_COMMUNITY): Payer: Self-pay

## 2012-01-05 ENCOUNTER — Encounter (HOSPITAL_COMMUNITY): Payer: Self-pay | Admitting: Emergency Medicine

## 2012-01-05 ENCOUNTER — Emergency Department (HOSPITAL_COMMUNITY)
Admission: EM | Admit: 2012-01-05 | Discharge: 2012-01-06 | Disposition: A | Discharge status: Home / Self Care | Payer: Self-pay | Attending: Emergency Medicine | Admitting: Emergency Medicine

## 2012-01-05 DIAGNOSIS — IMO0002 Reserved for concepts with insufficient information to code with codable children: Secondary | ICD-10-CM | POA: Insufficient documentation

## 2012-01-05 DIAGNOSIS — K7010 Alcoholic hepatitis without ascites: Secondary | ICD-10-CM | POA: Insufficient documentation

## 2012-01-05 DIAGNOSIS — F1020 Alcohol dependence, uncomplicated: Secondary | ICD-10-CM | POA: Insufficient documentation

## 2012-01-05 DIAGNOSIS — R569 Unspecified convulsions: Secondary | ICD-10-CM | POA: Insufficient documentation

## 2012-01-05 DIAGNOSIS — A5901 Trichomonal vulvovaginitis: Secondary | ICD-10-CM | POA: Insufficient documentation

## 2012-01-05 DIAGNOSIS — F10939 Alcohol use, unspecified with withdrawal, unspecified: Secondary | ICD-10-CM | POA: Insufficient documentation

## 2012-01-05 DIAGNOSIS — F10239 Alcohol dependence with withdrawal, unspecified: Secondary | ICD-10-CM

## 2012-01-05 DIAGNOSIS — F17200 Nicotine dependence, unspecified, uncomplicated: Secondary | ICD-10-CM | POA: Insufficient documentation

## 2012-01-05 DIAGNOSIS — X58XXXA Exposure to other specified factors, initial encounter: Secondary | ICD-10-CM | POA: Insufficient documentation

## 2012-01-05 LAB — CBC
HCT: 34.1 % — ABNORMAL LOW (ref 36.0–46.0)
Hemoglobin: 12.1 g/dL (ref 12.0–15.0)
MCH: 32.4 pg (ref 26.0–34.0)
MCV: 91.2 fL (ref 78.0–100.0)
RBC: 3.74 MIL/uL — ABNORMAL LOW (ref 3.87–5.11)

## 2012-01-05 LAB — COMPREHENSIVE METABOLIC PANEL
Alkaline Phosphatase: 79 U/L (ref 39–117)
BUN: 10 mg/dL (ref 6–23)
GFR calc Af Amer: >90 mL/min (ref >90)
Glucose, Bld: 81 mg/dL (ref 70–99)
Potassium: 3.8 mEq/L (ref 3.5–5.1)
Total Bilirubin: 0.4 mg/dL (ref 0.3–1.2)
Total Protein: 7.4 g/dL (ref 6.0–8.3)

## 2012-01-05 LAB — URINALYSIS, ROUTINE W REFLEX MICROSCOPIC
Bilirubin Urine: NEGATIVE
Ketones, ur: NEGATIVE mg/dL
Nitrite: NEGATIVE
Protein, ur: NEGATIVE mg/dL
Specific Gravity, Urine: 1.003 — ABNORMAL LOW (ref 1.005–1.030)
Urobilinogen, UA: 0.2 mg/dL (ref 0.0–1.0)

## 2012-01-05 LAB — DIFFERENTIAL
Eosinophils Absolute: 0 10*3/uL (ref 0.0–0.7)
Lymphs Abs: 1.4 10*3/uL (ref 0.7–4.0)
Monocytes Relative: 11 % (ref 3–12)
Neutrophils Relative %: 66 % (ref 43–77)

## 2012-01-05 LAB — ETHANOL: Alcohol, Ethyl (B): <11 mg/dL (ref 0–11)

## 2012-01-05 MED ORDER — ACETAMINOPHEN 325 MG PO TABS
650.0000 mg | ORAL_TABLET | Freq: Once | ORAL | Status: AC
  Administered 2012-01-05: 650 mg via ORAL
  Filled 2012-01-05: qty 2

## 2012-01-05 MED ORDER — LORAZEPAM 1 MG PO TABS: 1.0000 mg | ORAL_TABLET | Freq: Three times a day (TID) | ORAL | Status: AC | PRN

## 2012-01-05 MED ORDER — LORAZEPAM 1 MG PO TABS
2.0000 mg | ORAL_TABLET | Freq: Once | ORAL | Status: AC
  Administered 2012-01-05: 2 mg via ORAL
  Filled 2012-01-05: qty 2

## 2012-01-05 MED ORDER — CLINDAMYCIN HCL 150 MG PO CAPS: 300.0000 mg | ORAL_CAPSULE | Freq: Two times a day (BID) | ORAL | Status: AC

## 2012-01-05 NOTE — ED Provider Notes (Signed)
History     CSN: [id number]  Arrival date & time 01/05/12  2018   First MD Initiated Contact with Patient 01/05/12 2108      Chief Complaint  Patient presents with  . Seizures    (Consider location/radiation/quality/duration/timing/severity/associated sxs/prior treatment) Patient is a 26 y.o. female presenting with seizures. The history is provided by the patient and a relative.  Seizures   She says that she passed out and doesn't remember what happened. A family member who is with her states that he saw her shaking all over and foaming at the mouth. He estimates his shaking lasted 4 or 5 minutes. There was a postictal phase, and she is now back to her baseline. She suffered abrasions to her hands and knees. There is no bowel or bladder incontinence and no bit lip or tongue. She relates that she had 2 episodes of mild shaking while she was in grade school and these were never investigated. This episode is distinctly different from those. She was transported by ambulance.  Past Medical History  Diagnosis Date  . Stomach ulcer     Past Surgical History  Procedure Date  . Breast lumpectomy     right  . Cesarean section 2005    History reviewed. No pertinent family history.  History  Substance Use Topics  . Smoking status: Current Everyday Smoker -- 2.0 packs/day  . Smokeless tobacco: Not on file  . Alcohol Use: Yes     couple of 40's every day & before work, drink liquor after work with more 40's, when off drink all day    OB History    Grav Para Term Preterm Abortions TAB SAB Ect Mult Living                  Review of Systems  Neurological: Positive for seizures.  All other systems reviewed and are negative.    Allergies  Review of patient's allergies indicates no known allergies.  Home Medications   Current Outpatient Rx  Name Route Sig Dispense Refill  . ADULT MULTIVITAMIN W/MINERALS CH Oral Take 1 tablet by mouth daily.      BP 121/73  Pulse 79   Temp(Src) 98.5 F (36.9 C) (Oral)  Resp 18  SpO2 100%  Physical Exam  Nursing note and vitals reviewed.  26 year old female is awake, alert, oriented and in no acute distress. Vital signs are normal. Oxygen saturation is 100% which is normal. Head is normocephalic and atraumatic. Fundi are normal. PERRLA, EOMI. Oropharynx is clear. Neck is supple without adenopathy, JVD or tenderness. Back is nontender. Lungs are clear without rales, wheezes, rhonchi. Heart is regular rate and rhythm without murmur. Abdomen is soft, flat, nontender without masses or hepatosplenomegaly. Extremities have minor abrasions present over her hands and knees, no other injury seen. Full range of motion is present of all joints. Skin is warm and moist without rash. Neurologic: Mental status is normal, cranial nerves are intact, there no focal motor or sensory deficits. Psychiatric: No abnormalities of mood or affect.  ED Course  Procedures (including critical care time)   Labs Reviewed  CBC  DIFFERENTIAL  COMPREHENSIVE METABOLIC PANEL  ETHANOL  URINALYSIS, ROUTINE W REFLEX MICROSCOPIC  POCT PREGNANCY, URINE   No results found.  Results for orders placed during the hospital encounter of 01/05/12  CBC      Component Value Range   WBC 6.5  4.0 - 10.5 (K/uL)   RBC 3.74 (*) 3.87 - 5.11 (MIL/uL)   Hemoglobin  12.1  12.0 - 15.0 (g/dL)   HCT 95.6 (*) 21.3 - 46.0 (%)   MCV 91.2  78.0 - 100.0 (fL)   MCH 32.4  26.0 - 34.0 (pg)   MCHC 35.5  30.0 - 36.0 (g/dL)   RDW 08.6  57.8 - 46.9 (%)   Platelets 169  150 - 400 (K/uL)  DIFFERENTIAL      Component Value Range   Neutrophils Relative 66  43 - 77 (%)   Neutro Abs 4.3  1.7 - 7.7 (K/uL)   Lymphocytes Relative 22  12 - 46 (%)   Lymphs Abs 1.4  0.7 - 4.0 (K/uL)   Monocytes Relative 11  3 - 12 (%)   Monocytes Absolute 0.7  0.1 - 1.0 (K/uL)   Eosinophils Relative 1  0 - 5 (%)   Eosinophils Absolute 0.0  0.0 - 0.7 (K/uL)   Basophils Relative 0  0 - 1 (%)   Basophils  Absolute 0.0  0.0 - 0.1 (K/uL)  COMPREHENSIVE METABOLIC PANEL      Component Value Range   Sodium 130 (*) 135 - 145 (mEq/L)   Potassium 3.8  3.5 - 5.1 (mEq/L)   Chloride 96  96 - 112 (mEq/L)   CO2 22  19 - 32 (mEq/L)   Glucose, Bld 81  70 - 99 (mg/dL)   BUN 10  6 - 23 (mg/dL)   Creatinine, Ser 6.29  0.50 - 1.10 (mg/dL)   Calcium 8.9  8.4 - 52.8 (mg/dL)   Total Protein 7.4  6.0 - 8.3 (g/dL)   Albumin 4.1  3.5 - 5.2 (g/dL)   AST 88 (*) 0 - 37 (U/L)   ALT 88 (*) 0 - 35 (U/L)   Alkaline Phosphatase 79  39 - 117 (U/L)   Total Bilirubin 0.4  0.3 - 1.2 (mg/dL)   GFR calc non Af Amer >90  >90 (mL/min)   GFR calc Af Amer >90  >90 (mL/min)  ETHANOL      Component Value Range   Alcohol, Ethyl (B) <11  0 - 11 (mg/dL)  URINALYSIS, ROUTINE W REFLEX MICROSCOPIC      Component Value Range   Color, Urine YELLOW  YELLOW    APPearance CLEAR  CLEAR    Specific Gravity, Urine 1.003 (*) 1.005 - 1.030    pH 6.5  5.0 - 8.0    Glucose, UA NEGATIVE  NEGATIVE (mg/dL)   Hgb urine dipstick NEGATIVE  NEGATIVE    Bilirubin Urine NEGATIVE  NEGATIVE    Ketones, ur NEGATIVE  NEGATIVE (mg/dL)   Protein, ur NEGATIVE  NEGATIVE (mg/dL)   Urobilinogen, UA 0.2  0.0 - 1.0 (mg/dL)   Nitrite NEGATIVE  NEGATIVE    Leukocytes, UA TRACE (*) NEGATIVE   POCT PREGNANCY, URINE      Component Value Range   Preg Test, Ur NEGATIVE    URINE MICROSCOPIC-ADD ON      Component Value Range   Squamous Epithelial / LPF FEW (*) RARE    WBC, UA 0-2  <3 (WBC/hpf)   Bacteria, UA RARE  RARE    Urine-Other TRICHOMONAS PRESENT     Ct Head Wo Contrast  01/05/2012  *RADIOLOGY REPORT*  Clinical Data: New onset seizure.  CT HEAD WITHOUT CONTRAST  Technique:  Contiguous axial images were obtained from the base of the skull through the vertex without contrast.  Comparison: None.  Findings: No acute cortical infarct, hemorrhage, mass lesion is present.  The ventricles are of normal size.  No  significant extra- axial fluid collection is  present.  The paranasal sinuses and mastoid air cells are clear.  The osseous skull is intact.  IMPRESSION: Negative CT of the head.  Original Report Authenticated By: Jamesetta Orleans. MATTERN, M.D.     No diagnosis found.  2145: Patient's mother has arrived and informs me that the patient has a history of alcohol abuse, had gone through a detox program at [facility], and had recently started drinking again. At this point, patient admitted to drinking heavily every day and last drink was at about 2 AM today. She is mildly tremulous on exam. At this point, the seizure seems most likely to be due to alcohol withdrawal.  2330: And discussed results with patient and her mother. She expresses that she does not wish to go through detox at this time. She is not ill enough to require admission for her alcohol withdrawal, so she will be sent home with prescription for Ativan.  Impression: Seizure which is most likely secondary to alcohol withdrawal. Mild alcoholic hepatitis. Trichomonas infection. Because of recent alcohol ingestion, she will not be given Flagyl today. Prescription for Flagyl will be given with instructions that she take it when she is not going to be the consuming alcohol for several days.  MDM  First generalized seizure. She will get a seizure workup with CT of the head, metabolic panel, and urine drug screen. She will need an EEG as an outpatient. I do not anticipate starting her on anticonvulsants tonight.        Dione Booze, MD 01/05/12 [phone]
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
## 2012-01-05 NOTE — ED Notes (Signed)
Pt states she is unable to void at this time. Will try to collect again. Notified RN DM

## 2012-01-05 NOTE — ED Notes (Signed)
ZOX:[id number]<AV> Expected date:01/05/12<BR> Expected time: 8:11 PM<BR> Means of arrival:Ambulance<BR> Comments:<BR> EMS 80 GC, 25 yof eizure

## 2013-03-17 ENCOUNTER — Encounter (HOSPITAL_COMMUNITY): Payer: Self-pay | Admitting: Emergency Medicine

## 2013-03-17 ENCOUNTER — Emergency Department (HOSPITAL_COMMUNITY)
Admission: EM | Admit: 2013-03-17 | Discharge: 2013-03-17 | Disposition: A | Discharge status: Home / Self Care | Payer: 59 | Attending: Emergency Medicine | Admitting: Emergency Medicine

## 2013-03-17 DIAGNOSIS — Z79899 Other long term (current) drug therapy: Secondary | ICD-10-CM | POA: Insufficient documentation

## 2013-03-17 DIAGNOSIS — IMO0002 Reserved for concepts with insufficient information to code with codable children: Secondary | ICD-10-CM | POA: Insufficient documentation

## 2013-03-17 DIAGNOSIS — Z008 Encounter for other general examination: Secondary | ICD-10-CM | POA: Insufficient documentation

## 2013-03-17 DIAGNOSIS — F17200 Nicotine dependence, unspecified, uncomplicated: Secondary | ICD-10-CM | POA: Insufficient documentation

## 2013-03-17 DIAGNOSIS — F911 Conduct disorder, childhood-onset type: Secondary | ICD-10-CM | POA: Insufficient documentation

## 2013-03-17 DIAGNOSIS — Z8711 Personal history of peptic ulcer disease: Secondary | ICD-10-CM | POA: Insufficient documentation

## 2013-03-17 DIAGNOSIS — Z3202 Encounter for pregnancy test, result negative: Secondary | ICD-10-CM | POA: Insufficient documentation

## 2013-03-17 LAB — COMPREHENSIVE METABOLIC PANEL
AST: 31 U/L (ref 0–37)
Alkaline Phosphatase: 80 U/L (ref 39–117)
BUN: 10 mg/dL (ref 6–23)
CO2: 26 mEq/L (ref 19–32)
Chloride: 98 mEq/L (ref 96–112)
Creatinine, Ser: 0.77 mg/dL (ref 0.50–1.10)
GFR calc non Af Amer: >90 mL/min (ref >90)
Total Bilirubin: 0.8 mg/dL (ref 0.3–1.2)

## 2013-03-17 LAB — ETHANOL: Alcohol, Ethyl (B): <11 mg/dL (ref 0–11)

## 2013-03-17 LAB — RAPID URINE DRUG SCREEN, HOSP PERFORMED
Opiates: NOT DETECTED
Tetrahydrocannabinol: POSITIVE — AB

## 2013-03-17 LAB — CBC
HCT: 38.8 % (ref 36.0–46.0)
Hemoglobin: 13.4 g/dL (ref 12.0–15.0)
MCV: 93.9 fL (ref 78.0–100.0)
RBC: 4.13 MIL/uL (ref 3.87–5.11)
WBC: 5.5 10*3/uL (ref 4.0–10.5)

## 2013-03-17 LAB — PREGNANCY, URINE: Preg Test, Ur: NEGATIVE

## 2013-03-17 NOTE — ED Provider Notes (Signed)
Medical screening examination/treatment/procedure(s) were performed by non-physician practitioner and as supervising physician I was immediately available for consultation/collaboration.   Charles B. Bernette Mayers, MD 03/17/13 [phone]

## 2013-03-17 NOTE — ED Notes (Signed)
Pt continues to ramble loudly. Pt remains in forensic restraints.
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
## 2013-03-17 NOTE — ED Notes (Signed)
Pt reminded by ED staff cursing will not be tolerated. [facility] security at door way

## 2013-03-17 NOTE — ED Notes (Signed)
Pt arrived from [facility] via security under IVC. Pt rambling, loud, cursing. Pt refusing to answer most questions. Pt does deny SI/HI.

## 2013-03-27 DIAGNOSIS — K2920 Alcoholic gastritis without bleeding: Secondary | ICD-10-CM

## 2013-03-27 DIAGNOSIS — K920 Hematemesis: Secondary | ICD-10-CM

## 2013-03-27 DIAGNOSIS — E872 Acidosis, unspecified: Secondary | ICD-10-CM

## 2013-03-27 HISTORY — DX: Alcoholic gastritis without bleeding: K29.20

## 2013-03-27 HISTORY — DX: Acidosis: E87.2

## 2013-03-27 HISTORY — DX: Acidosis, unspecified: E87.20

## 2013-03-27 HISTORY — DX: Hematemesis: K92.0

## 2013-04-04 ENCOUNTER — Inpatient Hospital Stay (HOSPITAL_COMMUNITY)
Admission: EM | Admit: 2013-04-04 | Discharge: 2013-04-07 | DRG: 391 | Disposition: A | Discharge status: Home Health | Payer: 59 | Attending: Internal Medicine | Admitting: Internal Medicine

## 2013-04-04 ENCOUNTER — Encounter (HOSPITAL_COMMUNITY): Payer: Self-pay | Admitting: Emergency Medicine

## 2013-04-04 ENCOUNTER — Emergency Department (HOSPITAL_COMMUNITY): Payer: 59

## 2013-04-04 DIAGNOSIS — F1210 Cannabis abuse, uncomplicated: Secondary | ICD-10-CM | POA: Diagnosis present

## 2013-04-04 DIAGNOSIS — F1410 Cocaine abuse, uncomplicated: Secondary | ICD-10-CM | POA: Diagnosis present

## 2013-04-04 DIAGNOSIS — Z8711 Personal history of peptic ulcer disease: Secondary | ICD-10-CM

## 2013-04-04 DIAGNOSIS — K920 Hematemesis: Secondary | ICD-10-CM | POA: Diagnosis present

## 2013-04-04 DIAGNOSIS — R112 Nausea with vomiting, unspecified: Secondary | ICD-10-CM

## 2013-04-04 DIAGNOSIS — E8729 Other acidosis: Secondary | ICD-10-CM

## 2013-04-04 DIAGNOSIS — E860 Dehydration: Secondary | ICD-10-CM | POA: Diagnosis present

## 2013-04-04 DIAGNOSIS — Z87898 Personal history of other specified conditions: Secondary | ICD-10-CM

## 2013-04-04 DIAGNOSIS — F1020 Alcohol dependence, uncomplicated: Secondary | ICD-10-CM | POA: Diagnosis present

## 2013-04-04 DIAGNOSIS — E872 Acidosis, unspecified: Secondary | ICD-10-CM | POA: Diagnosis present

## 2013-04-04 DIAGNOSIS — K226 Gastro-esophageal laceration-hemorrhage syndrome: Secondary | ICD-10-CM | POA: Diagnosis present

## 2013-04-04 DIAGNOSIS — R1115 Cyclical vomiting syndrome unrelated to migraine: Secondary | ICD-10-CM

## 2013-04-04 DIAGNOSIS — F17200 Nicotine dependence, unspecified, uncomplicated: Secondary | ICD-10-CM | POA: Diagnosis present

## 2013-04-04 DIAGNOSIS — Z720 Tobacco use: Secondary | ICD-10-CM

## 2013-04-04 DIAGNOSIS — E876 Hypokalemia: Secondary | ICD-10-CM | POA: Diagnosis present

## 2013-04-04 DIAGNOSIS — K2920 Alcoholic gastritis without bleeding: Principal | ICD-10-CM | POA: Diagnosis present

## 2013-04-04 HISTORY — DX: Unspecified convulsions: R56.9

## 2013-04-04 HISTORY — DX: Other acidosis: E87.29

## 2013-04-04 HISTORY — DX: Tobacco use: Z72.0

## 2013-04-04 HISTORY — DX: Dehydration: E86.0

## 2013-04-04 HISTORY — DX: Nausea with vomiting, unspecified: R11.2

## 2013-04-04 LAB — BASIC METABOLIC PANEL
CO2: 22 mEq/L (ref 19–32)
Chloride: 104 mEq/L (ref 96–112)
Creatinine, Ser: 0.59 mg/dL (ref 0.50–1.10)

## 2013-04-04 LAB — COMPREHENSIVE METABOLIC PANEL
ALT: 24 U/L (ref 0–35)
AST: 30 U/L (ref 0–37)
Alkaline Phosphatase: 74 U/L (ref 39–117)
CO2: 18 mEq/L — ABNORMAL LOW (ref 19–32)
Calcium: 9.3 mg/dL (ref 8.4–10.5)
Chloride: 99 mEq/L (ref 96–112)
GFR calc Af Amer: >90 mL/min (ref >90)
GFR calc non Af Amer: >90 mL/min (ref >90)
Glucose, Bld: 100 mg/dL — ABNORMAL HIGH (ref 70–99)
Potassium: 3.3 mEq/L — ABNORMAL LOW (ref 3.5–5.1)
Sodium: 140 mEq/L (ref 135–145)
Total Bilirubin: 0.7 mg/dL (ref 0.3–1.2)

## 2013-04-04 LAB — CBC WITH DIFFERENTIAL/PLATELET
Basophils Absolute: 0 10*3/uL (ref 0.0–0.1)
Eosinophils Relative: 0 % (ref 0–5)
Lymphocytes Relative: 14 % (ref 12–46)
Lymphs Abs: 1.1 10*3/uL (ref 0.7–4.0)
MCV: 92.4 fL (ref 78.0–100.0)
Neutro Abs: 6.4 10*3/uL (ref 1.7–7.7)
Platelets: 309 10*3/uL (ref 150–400)
RBC: 3.81 MIL/uL — ABNORMAL LOW (ref 3.87–5.11)
WBC: 8.4 10*3/uL (ref 4.0–10.5)

## 2013-04-04 LAB — URINALYSIS, MICROSCOPIC ONLY
Ketones, ur: 40 mg/dL — AB
Leukocytes, UA: NEGATIVE
Nitrite: NEGATIVE
Specific Gravity, Urine: 1.014 (ref 1.005–1.030)
pH: 8.5 — ABNORMAL HIGH (ref 5.0–8.0)

## 2013-04-04 LAB — CG4 I-STAT (LACTIC ACID): Lactic Acid, Venous: 1.25 mmol/L (ref 0.5–2.2)

## 2013-04-04 LAB — PROTIME-INR
INR: 1.05 (ref 0.00–1.49)
Prothrombin Time: 13.6 seconds (ref 11.6–15.2)

## 2013-04-04 MED ORDER — ACETAMINOPHEN 650 MG RE SUPP: 650.0000 mg | Freq: Four times a day (QID) | RECTAL | Status: DC | PRN

## 2013-04-04 MED ORDER — FOLIC ACID 1 MG PO TABS
1.0000 mg | ORAL_TABLET | Freq: Every day | ORAL | Status: DC
  Administered 2013-04-05 – 2013-04-07 (×3): 1 mg via ORAL
  Filled 2013-04-04 (×3): qty 1

## 2013-04-04 MED ORDER — VITAMIN B-1 100 MG PO TABS
100.0000 mg | ORAL_TABLET | Freq: Every day | ORAL | Status: DC
  Administered 2013-04-05 – 2013-04-07 (×3): 100 mg via ORAL
  Filled 2013-04-04 (×3): qty 1

## 2013-04-04 MED ORDER — ONDANSETRON HCL 4 MG/2ML IJ SOLN
4.0000 mg | Freq: Once | INTRAMUSCULAR | Status: AC
  Administered 2013-04-04: 4 mg via INTRAVENOUS
  Filled 2013-04-04: qty 2

## 2013-04-04 MED ORDER — SODIUM CHLORIDE 0.45 % IV SOLN: INTRAVENOUS | Status: DC

## 2013-04-04 MED ORDER — SODIUM CHLORIDE 0.45 % IV SOLN
INTRAVENOUS | Status: DC
  Administered 2013-04-05 – 2013-04-06 (×5): via INTRAVENOUS
  Filled 2013-04-04 (×12): qty 1000

## 2013-04-04 MED ORDER — LORAZEPAM 1 MG PO TABS
1.0000 mg | ORAL_TABLET | Freq: Four times a day (QID) | ORAL | Status: DC | PRN
  Administered 2013-04-05 – 2013-04-07 (×4): 1 mg via ORAL
  Filled 2013-04-04 (×4): qty 1

## 2013-04-04 MED ORDER — FENTANYL CITRATE 0.05 MG/ML IJ SOLN
100.0000 ug | Freq: Once | INTRAMUSCULAR | Status: AC
  Administered 2013-04-04: 100 ug via INTRAVENOUS
  Filled 2013-04-04: qty 2

## 2013-04-04 MED ORDER — PANTOPRAZOLE SODIUM 40 MG IV SOLR
40.0000 mg | Freq: Two times a day (BID) | INTRAVENOUS | Status: DC
  Administered 2013-04-05 – 2013-04-07 (×6): 40 mg via INTRAVENOUS
  Filled 2013-04-04 (×7): qty 40

## 2013-04-04 MED ORDER — ADULT MULTIVITAMIN W/MINERALS CH
1.0000 | ORAL_TABLET | Freq: Every day | ORAL | Status: DC
  Administered 2013-04-05 – 2013-04-07 (×3): 1 via ORAL
  Filled 2013-04-04 (×3): qty 1

## 2013-04-04 MED ORDER — THIAMINE HCL 100 MG/ML IJ SOLN
100.0000 mg | Freq: Every day | INTRAMUSCULAR | Status: DC
  Filled 2013-04-04 (×3): qty 1

## 2013-04-04 MED ORDER — ACETAMINOPHEN 325 MG PO TABS
650.0000 mg | ORAL_TABLET | Freq: Four times a day (QID) | ORAL | Status: DC | PRN
  Administered 2013-04-05 – 2013-04-06 (×2): 650 mg via ORAL
  Filled 2013-04-04 (×2): qty 2

## 2013-04-04 MED ORDER — ONDANSETRON HCL 4 MG/2ML IJ SOLN: 4.0000 mg | Freq: Three times a day (TID) | INTRAMUSCULAR | Status: DC | PRN

## 2013-04-04 MED ORDER — ONDANSETRON HCL 4 MG/2ML IJ SOLN
4.0000 mg | Freq: Four times a day (QID) | INTRAMUSCULAR | Status: DC | PRN
  Administered 2013-04-05 – 2013-04-07 (×4): 4 mg via INTRAVENOUS
  Filled 2013-04-04 (×4): qty 2

## 2013-04-04 MED ORDER — NICOTINE 14 MG/24HR TD PT24
14.0000 mg | MEDICATED_PATCH | Freq: Every day | TRANSDERMAL | Status: DC
  Administered 2013-04-05 – 2013-04-07 (×4): 14 mg via TRANSDERMAL
  Filled 2013-04-04 (×4): qty 1

## 2013-04-04 MED ORDER — ALBUTEROL SULFATE (5 MG/ML) 0.5% IN NEBU: 2.5000 mg | INHALATION_SOLUTION | RESPIRATORY_TRACT | Status: DC | PRN

## 2013-04-04 MED ORDER — ONDANSETRON HCL 4 MG PO TABS: 4.0000 mg | ORAL_TABLET | Freq: Four times a day (QID) | ORAL | Status: DC | PRN

## 2013-04-04 MED ORDER — SODIUM CHLORIDE 0.9 % IV BOLUS (SEPSIS)
1000.0000 mL | Freq: Once | INTRAVENOUS | Status: AC
  Administered 2013-04-04: 1000 mL via INTRAVENOUS

## 2013-04-04 MED ORDER — SODIUM CHLORIDE 0.9 % IV SOLN
1000.0000 mL | Freq: Once | INTRAVENOUS | Status: AC
  Administered 2013-04-04: 1000 mL via INTRAVENOUS

## 2013-04-04 MED ORDER — LORAZEPAM 2 MG/ML IJ SOLN
1.0000 mg | Freq: Four times a day (QID) | INTRAMUSCULAR | Status: DC | PRN
  Administered 2013-04-05: 1 mg via INTRAVENOUS
  Filled 2013-04-04: qty 1

## 2013-04-04 MED ORDER — MORPHINE SULFATE 2 MG/ML IJ SOLN
2.0000 mg | INTRAMUSCULAR | Status: DC | PRN
  Administered 2013-04-05 – 2013-04-06 (×3): 2 mg via INTRAVENOUS
  Filled 2013-04-04 (×3): qty 1

## 2013-04-04 MED ORDER — SODIUM CHLORIDE 0.9 % IJ SOLN: 3.0000 mL | Freq: Two times a day (BID) | INTRAMUSCULAR | Status: DC

## 2013-04-04 NOTE — ED Notes (Signed)
Pt given ice chips

## 2013-04-04 NOTE — ED Provider Notes (Signed)
History     CSN: [id number]  Arrival date & time 04/04/13  1812   None     Chief Complaint  Patient presents with  . Abdominal Pain    (Consider location/radiation/quality/duration/timing/severity/associated sxs/prior treatment) HPI 27 yo F presenting with sudden onset nausea, vomiting, and abdominal pain this morning while eating breakfast.  Abdominal pain is diffuse and location depends on position.  No blood in emesis.  Denies diarrhea, had normal BM this morning.   Denies fevers.  Unable to tolerate any PO at home.  Has a history of a stomach ulcer.  Smokers about 1ppd.  Does drink alcohol, states she has been drinking more than usual the last week due to her birthday and a loss in the family.  Last drink yesterday at midnight.  No sick contacts.  Received some fluids and zofran from EMS.  Reports some improvement in n/v since zofran, now just with some dry heaves.  Past Medical History  Diagnosis Date  . Stomach ulcer   . Seizures     Past Surgical History  Procedure Laterality Date  . Breast lumpectomy      right  . Cesarean section  2005    No family history on file.  History  Substance Use Topics  . Smoking status: Current Every Day Smoker -- 1.00 packs/day    Types: Cigarettes  . Smokeless tobacco: Not on file  . Alcohol Use: Yes     Comment: occassionally    OB History   Grav Para Term Preterm Abortions TAB SAB Ect Mult Living                  Review of Systems  Constitutional: Negative for fever and chills.  HENT: Negative.   Eyes: Negative.   Respiratory: Negative.   Cardiovascular: Negative.   Gastrointestinal: Positive for nausea, vomiting and abdominal pain. Negative for diarrhea.  Genitourinary: Negative.   Musculoskeletal: Negative.   Skin: Negative.   Neurological: Negative.     Allergies  Review of patient's allergies indicates no known allergies.  Home Medications   Current Outpatient Rx  Name  Route  Sig  Dispense  Refill  .  Multiple Vitamin (MULITIVITAMIN WITH MINERALS) TABS   Oral   Take 1 tablet by mouth daily.           BP 133/80  Pulse 59  Temp(Src) 99.8 F (37.7 C) (Oral)  Resp 18  SpO2 100%  LMP 03/26/2013  Physical Exam  Constitutional: She is oriented to person, place, and time. She appears well-developed and well-nourished. She appears distressed.  HENT:  Head: Normocephalic and atraumatic.  Right Ear: External ear normal.  Left Ear: External ear normal.  Mouth/Throat: Oropharynx is clear and moist.  Neck: Normal range of motion. Neck supple.  Cardiovascular: Normal rate, regular rhythm and intact distal pulses.   No murmur heard. Pulmonary/Chest: Effort normal and breath sounds normal. No respiratory distress. She has no wheezes. She has no rales.  Abdominal: Soft. Bowel sounds are normal. She exhibits no distension. There is tenderness (diffuse). There is rebound (rebound on exam, but not with jostling of bed). There is no guarding.  Musculoskeletal: She exhibits no edema.  Neurological: She is alert and oriented to person, place, and time.  Skin: Skin is warm and dry. No rash noted. She is not diaphoretic. No erythema.    ED Course  Procedures (including critical care time)  Labs Reviewed  CBC WITH DIFFERENTIAL - Abnormal; Notable for the following:  RBC 3.81 (*)    HCT 35.2 (*)    All other components within normal limits  COMPREHENSIVE METABOLIC PANEL - Abnormal; Notable for the following:    Glucose, Bld 100 (*)    BUN 3 (*)    All other components within normal limits  ETHANOL - Abnormal; Notable for the following:    Alcohol, Ethyl (B) 24 (*)    All other components within normal limits  LIPASE, BLOOD  URINALYSIS, MICROSCOPIC ONLY  POCT PREGNANCY, URINE   No results found.   No diagnosis found.    MDM  27 yo F with abdominal pain, nausea and vomiting.  DDx includes pancreatitis, gastritis, gastroenteritis, hepatitis, gallbladder disease.  Mixed peritoneal  signs concerning for peritonitis, perforation.  Will check CMP, CBC, lipase, UA.  Will give fentanyl for pain.    8:15 PM: repeat abdominal exam shows improved diffuse tenderness (worse on right side and LLQ), no rebound or guarding. Reports improvement in pain following fentanyl.  Tolerating ice chips.  Lipase wnl.  CBC wnl.  LFTs wnl.  Does have a gap acidosis.  9:43 PM: Patient reports return of nausea and abdominal pain.  Will repeat zofran and fentanyl.  Discussed lab results with patient.  Will admit for observation overnight given gap acidosis.  10:20 PM: Discussed with [facility] hospitalist who will admit the patient.  Will get a KUB to rule out free air.  Phebe Colla, MD 04/04/13 2220

## 2013-04-04 NOTE — ED Notes (Signed)
Per Dr. Al Corpus, the hospitalist, pt able to go upstairs off telemetry.

## 2013-04-04 NOTE — ED Notes (Signed)
Patient transported to X-ray 

## 2013-04-04 NOTE — H&P (Signed)
[facility] History and Physical  [name] WUJ:[id number] DOB: April 16, 1986 DOA: 04/04/2013  Referring physician: Dr. Despina Hick, EDP PCP: No PCP Per Patient  Specialists:  None  Chief Complaint: Nausea and vomiting  HPI: [name] is a 27 y.o. female with history of EGD diagnosed PUD years ago, seizure disorder and not on regular medications for either conditions, polysubstance abuse-alcohol, tobacco, cocaine and THC, presented the [facility] ED on 04/04/13 with intractable nausea, vomiting and abdominal pain. Patient has been drinking excessive amounts of alcohol since her birthday 5 days ago. She was in her usual state of health until 10 AM today when she started having nausea and vomiting. She has had multiple episodes since then. Initially emesis consisted of food or drinks that she had consumed. During the last couple of episode she saw pink colored fluid suggestive of small amount of blood but no frank bright red blood, coffee grounds or blood clots. After the first couple of episodes of vomiting, she started having diffuse abdominal pain which was made worse by active vomiting. No diarrhea and had a normal BM early this morning. No fever or chills. She was unable to keep anything down. In ED, she received IV fluids, pain medications and antiemetics. Currently she indicates that she feels much better and has not vomited since 5 PM. Abdominal pain has decreased. In the ED, potassium 3.3, AG gap: 27, lactate 1.25, lipase 18, normal LFTs, hemoglobin 12.3, urine pregnancy test negative, UA not suggestive of UTI and blood alcohol level 24. KUB is pending. Hospitalist admission requested.   Review of Systems: All systems reviewed and apart from history of presenting illness, are negative   Past Medical History  Diagnosis Date  . Stomach ulcer   . Seizures    Past Surgical History  Procedure Laterality Date  . Breast lumpectomy      right  . Cesarean section  2005    Social History:  reports that she has been smoking Cigarettes.  She has been smoking about 1.00 pack per day. She does not have any smokeless tobacco history on file. She reports that  drinks alcohol. She reports that she uses illicit drugs (Marijuana). Single. Lives alone and is independent of activities of daily living.  No Known Allergies  No family history on file. strong family history of diabetes on mother side-mother, grandmother. Father has high blood pressure.  Prior to Admission medications   Medication Sig Start Date End Date Taking? Authorizing Provider  Multiple Vitamin (MULITIVITAMIN WITH MINERALS) TABS Take 1 tablet by mouth daily.   Yes Historical Provider, MD   Physical Exam: Filed Vitals:   04/04/13 1819 04/04/13 1823 04/04/13 1947 04/04/13 2233  BP:  133/80 106/69 105/69  Pulse:  59 84 73  Temp:  99.8 F (37.7 C) 97.7 F (36.5 C) 98.2 F (36.8 C)  TempSrc:  Oral Oral Oral  Resp:  18 16 16   SpO2: 98% 100% 99% 100%   Patient was examined with her female ED nurse as chaperone in room  General exam: Moderately built and nourished female patient, lying comfortably supine on the gurney in no obvious distress.  Head, eyes and ENT: Nontraumatic and normocephalic. Pupils equally reacting to light and accommodation. Oral mucosa dry.  Neck: Supple. No JVD, carotid bruit or thyromegaly.  Lymphatics: No lymphadenopathy.  Respiratory system: Clear to auscultation. No increased work of breathing.  Cardiovascular system: S1 and S2 heard, RRR. No JVD, murmurs, gallops, clicks or pedal edema.  Gastrointestinal system: Abdomen is nondistended, soft and diffuse mild tenderness, especially in the epigastric region. No rigidity, guarding or rebound. Normal bowel sounds heard. No organomegaly or masses appreciated.  Central nervous system: Alert and oriented. No focal neurological deficits.  Extremities: Symmetric 5 x 5 power. Peripheral pulses symmetrically felt.  Skin:  No rashes or acute findings. Tattoo on left upper arm.  Musculoskeletal system: Negative exam.  Psychiatry: Pleasant and cooperative.   Labs on Admission:  Basic Metabolic Panel:  Recent Labs Lab 04/04/13 1845  NA 140  K 3.3*  CL 99  CO2 18*  GLUCOSE 100*  BUN 3*  CREATININE 0.60  CALCIUM 9.3   Liver Function Tests:  Recent Labs Lab 04/04/13 1845  AST 30  ALT 24  ALKPHOS 74  BILITOT 0.7  PROT 7.5  ALBUMIN 4.2    Recent Labs Lab 04/04/13 1845  LIPASE 18   No results found for this basename: AMMONIA,  in the last 168 hours CBC:  Recent Labs Lab 04/04/13 1845  WBC 8.4  NEUTROABS 6.4  HGB 12.3  HCT 35.2*  MCV 92.4  PLT 309   Cardiac Enzymes: No results found for this basename: CKTOTAL, CKMB, CKMBINDEX, TROPONINI,  in the last 168 hours  BNP (last 3 results) No results found for this basename: PROBNP,  in the last 8760 hours CBG: No results found for this basename: GLUCAP,  in the last 168 hours  Radiological Exams on Admission: No results found.   Assessment/Plan Principal Problem:   Intractable nausea, vomiting & abdominal pain. Active Problems:   Hematemesis- mild   Dehydration   Hypokalemia   Tobacco abuse   Alcohol dependence   Metabolic acidosis, increased anion gap   1. Intractable nausea, vomiting and abdominal pain: DD-alcoholic gastritis, PUD, Mallory-Weiss tear and esophagitis. Admit to telemetry. Clear liquids by mouth as tolerated. Symptomatic treatment with antiemetics and pain medications. IV PPI. Follow KUB. 2. Mild hematemesis: Likely secondary to Mallory-Weiss tear. Management as per problem #1. Follow CBC closely. If this worsens, consider GI consultation otherwise conservative management. Cessation from alcohol and tobacco abuse counseled. 3. Dehydration: IV fluids. 4. Hypokalemia: Replete and IV fluids. 5. Anion gap metabolic acidosis: Possibly secondary to alcohol. Follow BMP. IV fluids. 6. Polysubstance abuse:  Alcohol, tobacco, cocaine and THC: Cessation counseled. Placed on Ativan protocol and nicotine patch.     Code Status: Full  Family Communication: Discussed directly with patient  Disposition Plan: Home when medically stable   Time spent: 55 minutes  [facility] Pager [phone]  If 7PM-7AM, please contact night-coverage [email or web address] Password [facility] 04/04/2013, 10:56 PM

## 2013-04-04 NOTE — ED Notes (Signed)
[id number]<NF> Expected date:<BR> Expected time:<BR> Means of arrival:<BR> Comments:<BR> 27 y/o F NVD

## 2013-04-04 NOTE — ED Notes (Signed)
Pt presenting to ed with c/o abdominal pain with positive nausea and vomiting

## 2013-04-04 NOTE — ED Notes (Signed)
Pt able to ambulate to restroom to provide a urine sample with no difficulty.

## 2013-04-05 DIAGNOSIS — E872 Acidosis: Secondary | ICD-10-CM

## 2013-04-05 DIAGNOSIS — R112 Nausea with vomiting, unspecified: Secondary | ICD-10-CM

## 2013-04-05 LAB — CBC
HCT: 31.7 % — ABNORMAL LOW (ref 36.0–46.0)
Hemoglobin: 11 g/dL — ABNORMAL LOW (ref 12.0–15.0)
Hemoglobin: 11.1 g/dL — ABNORMAL LOW (ref 12.0–15.0)
MCHC: 34.7 g/dL (ref 30.0–36.0)
MCV: 93 fL (ref 78.0–100.0)
Platelets: 268 10*3/uL (ref 150–400)
Platelets: 272 10*3/uL (ref 150–400)
RBC: 3.16 MIL/uL — ABNORMAL LOW (ref 3.87–5.11)
RBC: 3.38 MIL/uL — ABNORMAL LOW (ref 3.87–5.11)
RDW: 14.3 % (ref 11.5–15.5)
RDW: 14.7 % (ref 11.5–15.5)
WBC: 6.5 10*3/uL (ref 4.0–10.5)
WBC: 8.5 10*3/uL (ref 4.0–10.5)
WBC: 8.7 10*3/uL (ref 4.0–10.5)

## 2013-04-05 LAB — BASIC METABOLIC PANEL
BUN: 4 mg/dL — ABNORMAL LOW (ref 6–23)
Chloride: 103 mEq/L (ref 96–112)
GFR calc Af Amer: >90 mL/min (ref >90)
GFR calc non Af Amer: >90 mL/min (ref >90)
Glucose, Bld: 75 mg/dL (ref 70–99)
Potassium: 3.8 mEq/L (ref 3.5–5.1)
Sodium: 137 mEq/L (ref 135–145)

## 2013-04-05 LAB — TYPE AND SCREEN: Antibody Screen: NEGATIVE

## 2013-04-05 NOTE — Care Management Note (Addendum)
    Page 1 of 1   04/05/2013     12:56:33 PM   CARE MANAGEMENT NOTE 04/05/2013  Patient:  [name], [name]   Account Number:  [id number]  Date Initiated:  04/05/2013  Documentation initiated by:  Lanier Clam  Subjective/Objective Assessment:   ADMITTED W/N/V/ABD PAIN.NU:[id number] ABUSE.     Action/Plan:   FROM HOME.NO PCP.STATES UNEMPLOYED.STATES SHE HAS INSURANCE.   Anticipated DC Date:  04/06/2013   Anticipated DC Plan:  HOME/SELF CARE  In-house referral  PCP / [facility]      DC Planning Services  CM consult      Choice offered to / List presented to:             Status of service:  Completed, signed off Medicare Important Message given?   (If response is "NO", the following Medicare IM given date fields will be blank) Date Medicare IM given:   Date Additional Medicare IM given:    Discharge Disposition:    Per UR Regulation:  Reviewed for med. necessity/level of care/duration of stay  If discussed at Long Length of Stay Meetings, dates discussed:    Comments:  04/05/13 Aslin Farinas RN,BSN NCM [phone] INFORMED PATIENT TO GET ID# OF HER INSURANCE & CALL  TO ADMITTING DEPT.NOTED THAT SHE HAS [place] [employer].PROVIDED PATIENT W/PCP LISTING INCLUDING ADULT CLINIC,OTHER COMMUNITY RESOURCES,& [id number] MED LIST.

## 2013-04-05 NOTE — Progress Notes (Addendum)
[facility] PROGRESS NOTE  [name] ZOX:[id number] DOB: [birth year] DOA: 04/04/2013 PCP: No PCP Per Patient  Brief Narrative: 27 y.o. female with history of EGD diagnosed PUD years ago, seizure disorder and not on regular medications for either conditions, polysubstance abuse-alcohol, tobacco, cocaine and THC, presented the [facility] ED on 04/04/13 with intractable nausea, vomiting and abdominal pain. Patient has been drinking excessive amounts of alcohol since her birthday 5 days prior to presentation. She was in her usual state of health until 10 AM today when she started having nausea and vomiting. She has had multiple episodes since then. Initially emesis consisted of food or drinks that she had consumed. During the last couple of episode she saw pink colored fluid suggestive of small amount of blood but no frank bright red blood, coffee grounds or blood clots.   Assessment/Plan:  Intractable nausea, vomiting and abdominal pain: DD-alcoholic gastritis, PUD, Mallory-Weiss tear and esophagitis.  - tolerated clear liquid diet well this morning, did poorly at lunch after Jell-o - continues to have intractable nausea. Last emesis overnight, no blood but did not pay attention  Mild hematemesis: Likely secondary to Mallory-Weiss tear. Management as per problem #1. - CBC stable, doubt significant bleed - will monitor  Dehydration: IV fluids.  Hypokalemia: Replete and IV fluids.  Anion gap metabolic acidosis: Possibly secondary to alcohol. - resolved with hydration  Polysubstance abuse: Alcohol, tobacco, cocaine and THC: Cessation counseled. Placed on Ativan protocol and nicotine patch.  Code Status: Full Family Communication: mother, brother in the room  Disposition Plan: home when able to have po intake and will not need IV hydration  Consultants:  none  Procedures:  none  Antibiotics:  none  HPI/Subjective: - was feeling well this morning after breakfast  however when I saw her again after lunch she was complaining of abdominal pain, nausea which were worse after eating lunch.   Objective: Filed Vitals:   04/04/13 2233 04/04/13 2355 04/05/13 0508 04/05/13 0755  BP: 105/69 100/52 95/50 101/53  Pulse: 73 58 58 62  Temp: 98.2 F (36.8 C) 98.2 F (36.8 C) 98.2 F (36.8 C)   TempSrc: Oral Oral Oral   Resp: 16 16 16    Height:  5\' 1"  (1.549 m)    Weight:  52.5 kg (115 lb 11.9 oz)    SpO2: 100% 100% 100%     Intake/Output Summary (Last 24 hours) at 04/05/13 0925 Last data filed at 04/05/13 0800  Gross per 24 hour  Intake      0 ml  Output    400 ml  Net   -400 ml   Filed Weights   04/04/13 2355  Weight: 52.5 kg (115 lb 11.9 oz)    Exam:   General:  Uncomfortable   Cardiovascular: regular rate and rhythm, without MRG  Respiratory: good air movement, clear to auscultation throughout, no wheezing, ronchi or rales  Abdomen: mild tenderness to palpation in the epigastric area  MSK: no peripheral edema  Neuro: CN 2-12 grossly intact, MS 5/5 in all 4  Data Reviewed: Basic Metabolic Panel:  Recent Labs Lab 04/04/13 1845 04/04/13 2310 04/05/13 0650  NA 140 138 137  K 3.3* 3.7 3.8  CL 99 104 103  CO2 18* 22 23  GLUCOSE 100* 88 75  BUN 3* 4* 4*  CREATININE 0.60 0.59 0.68  CALCIUM 9.3 7.6* 7.9*   Liver Function Tests:  Recent Labs Lab 04/04/13 1845  AST 30  ALT 24  ALKPHOS 74  BILITOT 0.7  PROT 7.5  ALBUMIN 4.2    Recent Labs Lab 04/04/13 1845  LIPASE 18   CBC:  Recent Labs Lab 04/04/13 1845 04/05/13 0020 04/05/13 0650  WBC 8.4 8.5 8.7  NEUTROABS 6.4  --   --   HGB 12.3 10.3* 11.0*  HCT 35.2* 29.4* 31.7*  MCV 92.4 93.0 94.9  PLT 309 272 269   Studies: Dg Abd 1 View  04/04/2013  *RADIOLOGY REPORT*  Clinical Data: Abdominal pain.  ABDOMEN - 1 VIEW  Comparison: CT abdomen and pelvis 01/03/2009.  Abdominal ultrasound 07/02/2010.  Findings: Bowel gas pattern is unremarkable.  There is no evidence  for obstruction or free air.  The lung bases are clear.  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  IMPRESSION: No acute abnormality.   Original Report Authenticated By: Marin Roberts, M.D.     Scheduled Meds: . folic acid  1 mg Oral Daily  . multivitamin with minerals  1 tablet Oral Daily  . nicotine  14 mg Transdermal Daily  . pantoprazole (PROTONIX) IV  40 mg Intravenous Q12H  . sodium chloride  3 mL Intravenous Q12H  . thiamine  100 mg Oral Daily   Or  . thiamine  100 mg Intravenous Daily   Continuous Infusions: . sodium chloride 0.45 % with kcl 150 mL/hr at 04/05/13 0736    Principal Problem:   Intractable nausea, vomiting & abdominal pain. Active Problems:   Hematemesis- mild   Dehydration   Hypokalemia   Tobacco abuse   Alcohol dependence   Metabolic acidosis, increased anion gap  Time spent: 30  Pamella Pert, MD [facility] Pager [phone]. If 7 PM - 7 AM, please contact night-coverage at [email or web address], password [facility] 04/05/2013, 9:25 AM  LOS: 1 day

## 2013-04-05 NOTE — ED Provider Notes (Signed)
I saw and evaluated the patient, reviewed the resident's note and I agree with the findings and plan.   .Face to face Exam:  General:  Awake HEENT:  Atraumatic Resp:  Normal effort Abd:  Nondistended Neuro:No focal weakness   Nelia Shi, MD 04/05/13 1308

## 2013-04-06 DIAGNOSIS — F1020 Alcohol dependence, uncomplicated: Secondary | ICD-10-CM

## 2013-04-06 LAB — BASIC METABOLIC PANEL
BUN: 3 mg/dL — ABNORMAL LOW (ref 6–23)
Calcium: 8.3 mg/dL — ABNORMAL LOW (ref 8.4–10.5)
Chloride: 103 mEq/L (ref 96–112)
Creatinine, Ser: 0.7 mg/dL (ref 0.50–1.10)
GFR calc Af Amer: >90 mL/min (ref >90)

## 2013-04-06 LAB — CBC
HCT: 30.8 % — ABNORMAL LOW (ref 36.0–46.0)
Hemoglobin: 10.7 g/dL — ABNORMAL LOW (ref 12.0–15.0)
MCV: 94.8 fL (ref 78.0–100.0)
WBC: 5.5 10*3/uL (ref 4.0–10.5)

## 2013-04-06 MED ORDER — HYDROCODONE-ACETAMINOPHEN 5-325 MG PO TABS: 1.0000 | ORAL_TABLET | Freq: Four times a day (QID) | ORAL | Status: DC | PRN

## 2013-04-06 MED ORDER — PANTOPRAZOLE SODIUM 40 MG PO TBEC: 40.0000 mg | DELAYED_RELEASE_TABLET | Freq: Every day | ORAL | Status: DC

## 2013-04-06 MED ORDER — ONDANSETRON HCL 4 MG PO TABS: 4.0000 mg | ORAL_TABLET | Freq: Three times a day (TID) | ORAL | Status: DC | PRN

## 2013-04-06 MED ORDER — HYDROCODONE-ACETAMINOPHEN 5-325 MG PO TABS
1.0000 | ORAL_TABLET | Freq: Four times a day (QID) | ORAL | Status: DC | PRN
  Administered 2013-04-06 – 2013-04-07 (×4): 1 via ORAL
  Filled 2013-04-06 (×4): qty 1

## 2013-04-06 MED ORDER — SODIUM CHLORIDE 0.9 % IV SOLN
INTRAVENOUS | Status: DC
  Administered 2013-04-06: 75 mL/h via INTRAVENOUS
  Administered 2013-04-07: 04:00:00 via INTRAVENOUS

## 2013-04-06 NOTE — Evaluation (Signed)
Physical Therapy Evaluation Patient Details Name: [name] MRN: [id number] DOB: Nov 03, 1986 Today's Date: 04/06/2013 Time: 1327-1400 PT Time Calculation (min): 33 min  PT Assessment / Plan / Recommendation Clinical Impression  Pt. is 27 yo female admitted 04/04/13 with abdominal pain due to alcoholic gastritis. Pt. presents with ataxic gait and impaired  coordination, muscle tremors at rest.  Pt's knees tend to buckle while ambulating, appears to be safer with RW. Pt. OK with RW for home. Pt. reports that she lives in second floor apt/hotel. Pt. also states she is alone. Pt. will benefit from PT while in acute care. Pt. will benefit from HHPT for safety eval and balance retraining.    PT Assessment  Patient needs continued PT services    Follow Up Recommendations  Home health PT    Does the patient have the potential to tolerate intense rehabilitation      Barriers to Discharge Decreased caregiver support      Equipment Recommendations  Rolling walker with 5" wheels    Recommendations for Other Services OT consult   Frequency Min 5X/week    Precautions / Restrictions Precautions Precautions: Fall   Pertinent Vitals/Pain       Mobility  Bed Mobility Bed Mobility: Supine to Sit Supine to Sit: 7: Independent Transfers Transfers: Sit to Stand;Stand to Sit Sit to Stand: 4: Min guard;From bed;With upper extremity assist Stand to Sit: To bed;To chair/3-in-1;With armrests;With upper extremity assist;5: Supervision Details for Transfer Assistance: Pt. has to push up from surface with arms to get to stand. unable to stand  up in a normal fashion as would expect  for 27 YO. Ambulation/Gait Ambulation/Gait Assistance: 4: Min assist Assistive device: Rolling walker Ambulation/Gait Assistance Details: Pt. ambulated x 100 ft with IV pole and min/min guard. Pt ambulated x 50 ' with no pole and min/mod due to knees buckle at times. ambulated x 150' with RW with less buckling. Pt.  is noted to be moderately ataxic, muscle tremors present also while standing/walking. Gait Pattern: Step-through pattern;Ataxic;Decreased trunk rotation;Decreased stride length Gait velocity: decr. Stairs: Yes Stairs Assistance: 4: Min guard Stair Management Technique: One rail Right;Alternating pattern;Forwards Number of Stairs: 10    Exercises Other Exercises Other Exercises: instructed in UE alternatinghand taps, together tapping and supination/pronation simultaneous. Other Exercises: seated knee ext., heel /shin, alternating dorsi/plantar flexion and simultaneous   PT Diagnosis: Abnormality of gait;Generalized weakness  PT Problem List: Decreased strength;Decreased activity tolerance;Decreased balance;Decreased mobility;Decreased coordination;Decreased safety awareness;Decreased knowledge of use of DME PT Treatment Interventions: DME instruction;Gait training;Stair training;Functional mobility training;Therapeutic activities;Balance training;Patient/family education;Neuromuscular re-education   PT Goals Acute Rehab PT Goals PT Goal Formulation: With patient Time For Goal Achievement: 04/20/13 Potential to Achieve Goals: Good Pt will go Sit to Stand: Independently PT Goal: Sit to Stand - Progress: Goal set today Pt will go Stand to Sit: Independently PT Goal: Stand to Sit - Progress: Goal set today Pt will Ambulate: >150 feet;with modified independence PT Goal: Ambulate - Progress: Goal set today Pt will Go Up / Down Stairs: Flight;with modified independence PT Goal: Up/Down Stairs - Progress: Goal set today Pt will Perform Home Exercise Program: Independently PT Goal: Perform Home Exercise Program - Progress: Goal set today  Visit Information  Last PT Received On: 04/06/13 Assistance Needed: +1    Subjective Data  Subjective: I think I am OK> My mother is worried. Patient Stated Goal: to go home.   Prior Functioning  Home Living Lives With: Alone Available Help at  Discharge:  Available PRN/intermittently;Family Type of Home: Apartment (extended stay room.) Home Access: Stairs to enter Entrance Stairs-Number of Steps: 15 Entrance Stairs-Rails: Right;Left;Can reach both Home Layout: One level Bathroom Shower/Tub: Network engineer: None Prior Function Level of Independence: Independent Able to Take Stairs?: Yes Communication Communication: No difficulties    Cognition  Cognition Overall Cognitive Status: Appears within functional limits for tasks assessed/performed Arousal/Alertness: Awake/alert Orientation Level: Appears intact for tasks assessed Behavior During Session: [facility] for tasks performed    Extremity/Trunk Assessment Right Upper Extremity Assessment RUE ROM/Strength/Tone: [facility] for tasks assessed RUE Coordination: Deficits RUE Coordination Deficits: noted tremors present. noted decreased control for RRM/RAM, dysmetria Left Upper Extremity Assessment LUE ROM/Strength/Tone: WFL for tasks assessed LUE Sensation: Deficits LUE Sensation Deficits: same as R Right Lower Extremity Assessment RLE ROM/Strength/Tone: Deficits RLE ROM/Strength/Tone Deficits: hip flexion 4, knee extension-unable to sustain contraction against resistance. Muscle tremors during contraction noted. RLE Sensation: WFL - Light Touch RLE Coordination: Deficits RLE Coordination Deficits: ataxia noted for heel/shin, decreased control for alternating toe taps. knee extension. Left Lower Extremity Assessment LLE ROM/Strength/Tone: Deficits LLE ROM/Strength/Tone Deficits: same as R LLE Sensation: WFL - Light Touch LLE Coordination: Deficits LLE Coordination Deficits: same as R Trunk Assessment Trunk Assessment:  (noted trunk titubations sitting upright )   Balance Balance Balance Assessed: Yes Static Standing Balance Static Standing - Balance Support: No upper extremity supported Static Standing - Level of Assistance:  5: Stand by assistance Single Leg Stance - Right Leg: 7 Single Leg Stance - Left Leg: 10 Tandem Stance - Right Leg: 10 Tandem Stance - Left Leg: 10 Rhomberg - Eyes Closed: 10  End of Session PT - End of Session Equipment Utilized During Treatment: Gait belt Activity Tolerance: Patient tolerated treatment well Patient left:  (in BR.) Nurse Communication: Mobility status  GP     Rada Hay 04/06/2013, 3:13 PM Blanchard Kelch PT [phone]

## 2013-04-06 NOTE — Progress Notes (Signed)
[facility] PROGRESS NOTE  [name] XBJ:[id number] DOB: November 14, 1986 DOA: 04/04/2013 PCP: No PCP Per Patient  Brief Narrative: 27 y.o. female with history of EGD diagnosed PUD years ago, seizure disorder and not on regular medications for either conditions, polysubstance abuse-alcohol, tobacco, cocaine and THC, presented the [facility] ED on 04/04/13 with intractable nausea, vomiting and abdominal pain. Patient has been drinking excessive amounts of alcohol since her birthday 5 days prior to presentation. She was in her usual state of health until 10 AM today when she started having nausea and vomiting. She has had multiple episodes since then. Initially emesis consisted of food or drinks that she had consumed. During the last couple of episode she saw pink colored fluid suggestive of small amount of blood but no frank bright red blood, coffee grounds or blood clots.   Assessment/Plan:  Intractable nausea, vomiting and abdominal pain: DD-alcoholic gastritis, PUD, Mallory-Weiss tear and esophagitis.  - tolerated clear liquid diet well this morning, did poorly at lunch after Jell-[employer] - continues to have intractable nausea and inability to tolerate more than minimal clear liquid diet.   Mild hematemesis: Likely secondary to Mallory-Weiss tear. Management as per problem #1. - CBC stable, doubt significant bleed - will monitor  Dehydration: IV fluids.  Hypokalemia: Replete and IV fluids.  Anion gap metabolic acidosis: Possibly secondary to alcohol. - resolved with hydration  Polysubstance abuse: Alcohol, tobacco, cocaine and THC: Cessation counseled. Placed on Ativan protocol and nicotine patch.  Asked PT to evaluate patient because of reported balance issued. Seems to be quite ataxic per PT evaluation. Will re-evaluate tomorrow and hopefully discharge.   Code Status: Full Family Communication: mother, brother in the room  Disposition Plan: home when able to have po intake  and will not need IV hydration; likely 4/12  Consultants:  none  Procedures:  none  Antibiotics:  none  HPI/Subjective: - complains of nausea. An episode of emesis late last night yellow-green  Objective: Filed Vitals:   04/05/13 1419 04/05/13 2117 04/06/13 0553 04/06/13 0627  BP: 94/44 100/49 90/44 94/53   Pulse: 54 61 78   Temp: 98.4 F (36.9 C) 98.5 F (36.9 C) 98.2 F (36.8 C)   TempSrc: Oral Oral Oral   Resp: 18 20 16    Height:      Weight:      SpO2: 100% 100% 100%     Intake/Output Summary (Last 24 hours) at 04/06/13 0800 Last data filed at 04/06/13 0300  Gross per 24 hour  Intake   1200 ml  Output   3400 ml  Net  -2200 ml   Filed Weights   04/04/13 2355  Weight: 52.5 kg (115 lb 11.9 oz)    Exam:   General:  Uncomfortable   Cardiovascular: regular rate and rhythm, without MRG  Respiratory: good air movement, clear to auscultation throughout, no wheezing, ronchi or rales  Abdomen: mild tenderness to palpation in the epigastric area  MSK: no peripheral edema  Neuro: CN 2-12 grossly intact, MS 5/5 in all 4  Data Reviewed: Basic Metabolic Panel:  Recent Labs Lab 04/04/13 1845 04/04/13 2310 04/05/13 0650 04/06/13 0442  NA 140 138 137 133*  K 3.3* 3.7 3.8 4.3  CL 99 104 103 103  CO2 18* 22 23 20   GLUCOSE 100* 88 75 75  BUN 3* 4* 4* 3*  CREATININE 0.60 0.59 0.68 0.70  CALCIUM 9.3 7.6* 7.9* 8.3*   Liver Function Tests:  Recent Labs Lab 04/04/13 1845  AST  30  ALT 24  ALKPHOS 74  BILITOT 0.7  PROT 7.5  ALBUMIN 4.2    Recent Labs Lab 04/04/13 1845  LIPASE 18   CBC:  Recent Labs Lab 04/04/13 1845 04/05/13 0020 04/05/13 0650 04/05/13 1738 04/06/13 0442  WBC 8.4 8.5 8.7 6.5 5.5  NEUTROABS 6.4  --   --   --   --   HGB 12.3 10.3* 11.0* 11.1* 10.7*  HCT 35.2* 29.4* 31.7* 32.3* 30.8*  MCV 92.4 93.0 94.9 95.6 94.8  PLT 309 272 269 268 252   Studies: Dg Abd 1 View  04/04/2013  *RADIOLOGY REPORT*  Clinical Data:  Abdominal pain.  ABDOMEN - 1 VIEW  Comparison: CT abdomen and pelvis 01/03/2009.  Abdominal ultrasound 07/02/2010.  Findings: Bowel gas pattern is unremarkable.  There is no evidence for obstruction or free air.  The lung bases are clear.  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  IMPRESSION: No acute abnormality.   Original Report Authenticated By: Marin Roberts, M.D.     Scheduled Meds: . folic acid  1 mg Oral Daily  . multivitamin with minerals  1 tablet Oral Daily  . nicotine  14 mg Transdermal Daily  . pantoprazole (PROTONIX) IV  40 mg Intravenous Q12H  . sodium chloride  3 mL Intravenous Q12H  . thiamine  100 mg Oral Daily   Or  . thiamine  100 mg Intravenous Daily   Continuous Infusions: . sodium chloride 0.45 % with kcl 150 mL/hr at 04/06/13 0550    Principal Problem:   Intractable nausea, vomiting & abdominal pain. Active Problems:   Hematemesis- mild   Dehydration   Hypokalemia   Tobacco abuse   Alcohol dependence   Metabolic acidosis, increased anion gap  Time spent: 25  Pamella Pert, MD [facility] Pager [phone]. If 7 PM - 7 AM, please contact night-coverage at [email or web address], password [facility] 04/06/2013, 8:00 AM  LOS: 2 days

## 2013-04-07 DIAGNOSIS — F17200 Nicotine dependence, unspecified, uncomplicated: Secondary | ICD-10-CM

## 2013-04-07 LAB — BASIC METABOLIC PANEL
BUN: 3 mg/dL — ABNORMAL LOW (ref 6–23)
Creatinine, Ser: 0.72 mg/dL (ref 0.50–1.10)
GFR calc Af Amer: >90 mL/min (ref >90)
GFR calc non Af Amer: >90 mL/min (ref >90)
Potassium: 3.6 mEq/L (ref 3.5–5.1)

## 2013-04-07 LAB — CBC
HCT: 30.6 % — ABNORMAL LOW (ref 36.0–46.0)
MCHC: 34.6 g/dL (ref 30.0–36.0)
Platelets: 253 10*3/uL (ref 150–400)
RDW: 14.2 % (ref 11.5–15.5)

## 2013-04-07 MED ORDER — LORAZEPAM 0.5 MG PO TABS: 0.5000 mg | ORAL_TABLET | Freq: Three times a day (TID) | ORAL | Status: DC | PRN

## 2013-04-07 NOTE — Progress Notes (Signed)
Pt c/o crushing chest pain 6/10. EKG performed, vicodin given, O2 sat 100% on RA. MD notified. No new orders at this time. Avika Carbine, Lavone Orn, RN

## 2013-04-07 NOTE — Evaluation (Signed)
Occupational Therapy Evaluation Patient Details Name: [name] MRN: [id number] DOB: [birth year] Today's Date: 04/07/2013 Time: 4098-1191 OT Time Calculation (min): 17 min  OT Assessment / Plan / Recommendation Clinical Impression  Pt is a 27 year old female admitted for nausea, vomitting and abdominal pain. She presents with tremors, decreased balance and functional mobilty and safety with ADL. She will benefit from skilled OT services to improve ADL independence and safety.    OT Assessment  Patient needs continued OT Services    Follow Up Recommendations  Home health OT;Supervision/Assistance - 24 hour (at least initially if still requiring min assist at d/c)    Barriers to Discharge      Equipment Recommendations  3 in 1 bedside comode    Recommendations for Other Services    Frequency  Min 2X/week    Precautions / Restrictions Precautions Precautions: Fall Precaution Comments: pt shaky in standing Restrictions Weight Bearing Restrictions: No        ADL  Grooming: Simulated;Minimal assistance Where Assessed - Grooming: Unsupported standing Upper Body Bathing: Simulated;Chest;Right arm;Left arm;Abdomen;Set up Where Assessed - Upper Body Bathing: Unsupported sitting Lower Body Bathing: Simulated;Min guard Where Assessed - Lower Body Bathing: Supported sit to stand Upper Body Dressing: Simulated;Set up Where Assessed - Upper Body Dressing: Unsupported sitting Lower Body Dressing: Simulated;Min guard Where Assessed - Lower Body Dressing: Supported sit to stand Toilet Transfer: Performed;Minimal assistance Toilet Transfer Method: Other (comment) (into bathroom with RW) Acupuncturist: Comfort height toilet;Grab bars Toileting - Clothing Manipulation and Hygiene: Performed;Min guard Where Assessed - Engineer, mining and Hygiene: Standing Tub/Shower Transfer: Simulated;Minimal assistance Tub/Shower Transfer Method:  (simulate step over  tub) Equipment Used: Rolling walker ADL Comments: Pt is very shaky in standing and with functional mobility around the room even with RW. Pt attempted to sit on toilet without UE support and "plopped" back on toilet slightly. She did better with grab bar use to help control descent. Feel she will benefit from 3in1 even if short term to increase safety. Practiced step over tub holding to tub wall and pt is very shaky with this also. Discussed a tub seat but pt wanting to hold off on ordering one. Advised pt to sponge bathe for awhile till stronger/less shaky and when she does attempt to get into tub to [facility] sure she has someone that can assist. Pt verbalized understanding.     OT Diagnosis: Generalized weakness;Ataxia  OT Problem List: Decreased strength;Decreased knowledge of use of DME or AE OT Treatment Interventions: Self-care/ADL training;DME and/or AE instruction;Therapeutic activities;Patient/family education   OT Goals Acute Rehab OT Goals OT Goal Formulation: With patient Time For Goal Achievement: 04/21/13 Potential to Achieve Goals: Good ADL Goals Pt Will Perform Grooming: with supervision;Standing at sink ADL Goal: Grooming - Progress: Goal set today Pt Will Perform Lower Body Bathing: with supervision;Sit to stand from chair;Sit to stand from bed ADL Goal: Lower Body Bathing - Progress: Goal set today Pt Will Perform Lower Body Dressing: with supervision;Sit to stand from chair;Sit to stand from bed ADL Goal: Lower Body Dressing - Progress: Goal set today Pt Will Transfer to Toilet: with supervision;Ambulation;3-in-1;with DME ADL Goal: Toilet Transfer - Progress: Goal set today Pt Will Perform Toileting - Clothing Manipulation: with supervision;Standing ADL Goal: Toileting - Clothing Manipulation - Progress: Goal set today Pt Will Perform Tub/Shower Transfer: with supervision;Tub transfer ADL Goal: Tub/Shower Transfer - Progress: Goal set today Additional ADL Goal #1: Pt will  gather all items for B/D with supervision  and RW PRN. ADL Goal: Additional Goal #1 - Progress: Goal set today  Visit Information  Last OT Received On: 04/07/13 Assistance Needed: +1    Subjective Data  Subjective: I want to go home Patient Stated Goal: Home   Prior Functioning               Vision/Perception     Cognition  Cognition Overall Cognitive Status: Appears within functional limits for tasks assessed/performed Arousal/Alertness: Awake/alert Behavior During Session: [facility] for tasks performed    Extremity/Trunk Assessment Right Upper Extremity Assessment RUE ROM/Strength/Tone: [facility] for tasks assessed RUE Coordination Deficits: tremors present Left Upper Extremity Assessment LUE ROM/Strength/Tone: WFL for tasks assessed LUE Coordination:  (tremors present)     Mobility Bed Mobility Bed Mobility: Supine to Sit Supine to Sit: 7: Independent Transfers Transfers: Sit to Stand;Stand to Sit Sit to Stand: 4: Min guard;From bed;From toilet;With upper extremity assist Stand to Sit: 4: Min guard;To toilet;To chair/3-in-1;With upper extremity assist Details for Transfer Assistance: min verbal cues for safety and UE support      Exercise     Balance Balance Balance Assessed: Yes Dynamic Standing Balance Dynamic Standing - Level of Assistance: 4: Min assist   End of Session OT - End of Session Activity Tolerance: Patient tolerated treatment well Patient left: in chair;with call bell/phone within reach  GO     [name] [phone] 04/07/2013, 9:28 AM

## 2013-04-07 NOTE — Discharge Summary (Signed)
Physician Discharge Summary  [name] ZOX:[id number] DOB: 1986/11/02 DOA: 04/04/2013  PCP: No PCP Per Patient  Admit date: 04/04/2013 Discharge date: 04/07/2013  Time spent: 40 minutes  Discharge Diagnoses:  Principal Problem:   Intractable nausea, vomiting & abdominal pain. Active Problems:   Hematemesis- mild   Dehydration   Hypokalemia   Tobacco abuse   Alcohol dependence   Metabolic acidosis, increased anion gap  Discharge Condition: stable  Diet recommendation: regular;   Filed Weights   04/04/13 2355  Weight: 52.5 kg (115 lb 11.9 oz)   History of present illness:  27 y.o. female with history of EGD diagnosed PUD years ago, seizure disorder and not on regular medications for either conditions, polysubstance abuse-alcohol, tobacco, cocaine and THC, presented the [facility] ED on 04/04/13 with intractable nausea, vomiting and abdominal pain. Patient has been drinking excessive amounts of alcohol since her birthday 5 days ago. She was in her usual state of health until 10 AM today when she started having nausea and vomiting. She has had multiple episodes since then. Initially emesis consisted of food or drinks that she had consumed. During the last couple of episode she saw pink colored fluid suggestive of small amount of blood but no frank bright red blood, coffee grounds or blood clots. After the first couple of episodes of vomiting, she started having diffuse abdominal pain which was made worse by active vomiting. No diarrhea and had a normal BM early this morning. No fever or chills. She was unable to keep anything down. In ED, she received IV fluids, pain medications and antiemetics. Currently she indicates that she feels much better and has not vomited since 5 PM. Abdominal pain has decreased. In the ED, potassium 3.3, AG gap: 27, lactate 1.25, lipase 18, normal LFTs, hemoglobin 12.3, urine pregnancy test negative, UA not suggestive of UTI and blood alcohol level 24.    Hospital Course:  Intractable nausea, vomiting and abdominal pain: Likely secondary to alcoholic gastritis. Patient was started on a clear liquid diet and she was able to tolerate that well; she had 2 more episodes of emesis of the hospitalization, which were bilious without any blood. Mild hematemesis: Likely secondary to Mallory-Weiss tear versus diffuse gastritis. Her hematemesis was in the ED only, and she had no episodes of blood-tinged emesis on the floor. Her hemoglobin remained stable during this hospitalization which suggests against a significant bleed. Dehydration: IV fluids.   Anion gap metabolic acidosis: Possibly secondary to alcohol as her alcohol level was positive on the patient. It resolved with hydration. Polysubstance abuse: Alcohol, tobacco, cocaine and THC. Extensively counseled against alcohol binging alcohol use in general, against tobacco or other drug use. Weakness - patient was evaluated by PT and OT while she was hospitalized given the fact that she was complaining of weakness. I suspect that this is related to recent episodes of nausea vomiting abdominal pain and feeling weak overall in the setting of heavy alcohol abuse. She will be set up home with home health PT and OT, I suspect she'll need this for a very short period of time. She was given a list of primary care providers in the area, and she expressed understanding that she is to set up with her PCP as soon as possible.   Procedures:  none  Consultations:  none  Discharge Exam: Filed Vitals:   04/06/13 0627 04/06/13 1504 04/06/13 2040 04/07/13 0521  BP: 94/53 99/55 98/58  93/44  Pulse:  65 57 67  Temp:  98.4 F (36.9 C) 98.8 F (37.1 C) 98.2 F (36.8 C)  TempSrc:  Oral Oral Oral  Resp:  20 19 19   Height:      Weight:      SpO2:  100% 100% 100%    General: NAD, sitting in chair Cardiovascular: RRR without MRG Respiratory: CTA biL  Discharge Instructions     Medication List    TAKE these  medications       HYDROcodone-acetaminophen 5-325 MG per tablet  Commonly known as:  NORCO/VICODIN  Take 1 tablet by mouth every 6 (six) hours as needed.     LORazepam 0.5 MG tablet  Commonly known as:  ATIVAN  Take 1 tablet (0.5 mg total) by mouth every 8 (eight) hours as needed for anxiety.     multivitamin with minerals Tabs  Take 1 tablet by mouth daily.     ondansetron 4 MG tablet  Commonly known as:  ZOFRAN  Take 1 tablet (4 mg total) by mouth every 8 (eight) hours as needed for nausea.     pantoprazole 40 MG tablet  Commonly known as:  PROTONIX  Take 1 tablet (40 mg total) by mouth daily.        The results of significant diagnostics from this hospitalization (including imaging, microbiology, ancillary and laboratory) are listed below for reference.    Significant Diagnostic Studies: Dg Abd 1 View  04/04/2013  *RADIOLOGY REPORT*  Clinical Data: Abdominal pain.  ABDOMEN - 1 VIEW  Comparison: CT abdomen and pelvis 01/03/2009.  Abdominal ultrasound 07/02/2010.  Findings: Bowel gas pattern is unremarkable.  There is no evidence for obstruction or free air.  The lung bases are clear.  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  IMPRESSION: No acute abnormality.   Original Report Authenticated By: Marin Roberts, M.D.    Labs: Basic Metabolic Panel:  Recent Labs Lab 04/04/13 1845 04/04/13 2310 04/05/13 0650 04/06/13 0442 04/07/13 0503  NA 140 138 137 133* 137  K 3.3* 3.7 3.8 4.3 3.6  CL 99 104 103 103 105  CO2 18* 22 23 20 22   GLUCOSE 100* 88 75 75 84  BUN 3* 4* 4* 3* 3*  CREATININE 0.60 0.59 0.68 0.70 0.72  CALCIUM 9.3 7.6* 7.9* 8.3* 8.3*   Liver Function Tests:  Recent Labs Lab 04/04/13 1845  AST 30  ALT 24  ALKPHOS 74  BILITOT 0.7  PROT 7.5  ALBUMIN 4.2    Recent Labs Lab 04/04/13 1845  LIPASE 18   CBC:  Recent Labs Lab 04/04/13 1845 04/05/13 0020 04/05/13 0650 04/05/13 1738 04/06/13 0442 04/07/13 0503  WBC 8.4 8.5  8.7 6.5 5.5 5.4  NEUTROABS 6.4  --   --   --   --   --   HGB 12.3 10.3* 11.0* 11.1* 10.7* 10.6*  HCT 35.2* 29.4* 31.7* 32.3* 30.8* 30.6*  MCV 92.4 93.0 94.9 95.6 94.8 95.0  PLT 309 272 269 268 252 253    Signed:  GHERGHE, COSTIN  [facility] 04/07/2013, 1:18 PM

## 2013-04-07 NOTE — Care Management (Signed)
Cm spoke with patient concerning discharge planning. MD orders for HHPT/OT. Pt offered choice for [facility] agencies. Per pt choice [facility] to provide [facility] services upon discharge. MD order for RW. DME delivered to room prior to discharge. Pt states adult brother will stay with her tonight until bedtime. Pt states a family member will be with her assisting in home care each day until bedtime. No other needs stated.   [name] [phone]

## 2013-04-11 ENCOUNTER — Emergency Department (HOSPITAL_COMMUNITY)
Admission: EM | Admit: 2013-04-11 | Discharge: 2013-04-11 | Disposition: A | Discharge status: Home / Self Care | Payer: 59 | Attending: Emergency Medicine | Admitting: Emergency Medicine

## 2013-04-11 ENCOUNTER — Encounter: Payer: Self-pay | Admitting: Medical

## 2013-04-11 ENCOUNTER — Encounter (HOSPITAL_COMMUNITY): Payer: Self-pay | Admitting: Emergency Medicine

## 2013-04-11 ENCOUNTER — Ambulatory Visit (INDEPENDENT_AMBULATORY_CARE_PROVIDER_SITE_OTHER): Payer: 59 | Admitting: Medical

## 2013-04-11 ENCOUNTER — Ambulatory Visit: Payer: Self-pay | Admitting: Medical

## 2013-04-11 VITALS — BP 100/68 | HR 96 | Temp 98.8°F | Resp 16 | Wt 114.0 lb

## 2013-04-11 DIAGNOSIS — Z609 Problem related to social environment, unspecified: Secondary | ICD-10-CM

## 2013-04-11 DIAGNOSIS — F419 Anxiety disorder, unspecified: Secondary | ICD-10-CM

## 2013-04-11 DIAGNOSIS — Z3202 Encounter for pregnancy test, result negative: Secondary | ICD-10-CM | POA: Insufficient documentation

## 2013-04-11 DIAGNOSIS — Z659 Problem related to unspecified psychosocial circumstances: Secondary | ICD-10-CM

## 2013-04-11 DIAGNOSIS — F4329 Adjustment disorder with other symptoms: Secondary | ICD-10-CM

## 2013-04-11 DIAGNOSIS — Z658 Other specified problems related to psychosocial circumstances: Secondary | ICD-10-CM

## 2013-04-11 DIAGNOSIS — F1910 Other psychoactive substance abuse, uncomplicated: Secondary | ICD-10-CM

## 2013-04-11 DIAGNOSIS — R112 Nausea with vomiting, unspecified: Secondary | ICD-10-CM

## 2013-04-11 DIAGNOSIS — E872 Acidosis, unspecified: Secondary | ICD-10-CM

## 2013-04-11 DIAGNOSIS — F411 Generalized anxiety disorder: Secondary | ICD-10-CM

## 2013-04-11 DIAGNOSIS — F1010 Alcohol abuse, uncomplicated: Secondary | ICD-10-CM | POA: Insufficient documentation

## 2013-04-11 LAB — COMPREHENSIVE METABOLIC PANEL
ALT: 20 U/L (ref 0–35)
AST: 27 U/L (ref 0–37)
Calcium: 9.6 mg/dL (ref 8.4–10.5)
Sodium: 135 mEq/L (ref 135–145)
Total Protein: 7.5 g/dL (ref 6.0–8.3)

## 2013-04-11 LAB — RAPID URINE DRUG SCREEN, HOSP PERFORMED
Amphetamines: NOT DETECTED
Benzodiazepines: NOT DETECTED
Cocaine: POSITIVE — AB
Opiates: NOT DETECTED
Tetrahydrocannabinol: POSITIVE — AB

## 2013-04-11 LAB — CBC
MCH: 33.1 pg (ref 26.0–34.0)
MCHC: 35.1 g/dL (ref 30.0–36.0)
Platelets: 308 10*3/uL (ref 150–400)
RBC: 4.05 MIL/uL (ref 3.87–5.11)

## 2013-04-11 MED ORDER — ACETAMINOPHEN 500 MG PO TABS
1000.0000 mg | ORAL_TABLET | Freq: Once | ORAL | Status: AC
  Administered 2013-04-11: 1000 mg via ORAL
  Filled 2013-04-11: qty 2

## 2013-04-11 MED ORDER — PROMETHAZINE HCL 25 MG PO TABS: 25.0000 mg | ORAL_TABLET | Freq: Four times a day (QID) | ORAL | Status: DC | PRN

## 2013-04-11 MED ORDER — LORAZEPAM 1 MG PO TABS: 1.0000 mg | ORAL_TABLET | Freq: Three times a day (TID) | ORAL | Status: DC | PRN

## 2013-04-11 NOTE — Progress Notes (Addendum)
Subjective: Here as a new patient today, accompanied by her mother.  She is here for hospital f/u.  She reports being admitted to [facility] recently due to uncontrollable nausea, vomiting, dehydration.   Mother and her do report that she has hx/o alcohol abuse, started drinking alcohol when she was 27yo.   She currently has no set home.  She states that she lives with friends at times, family at times, hotels, just where-ever she can stay.   She does confess to frequent alcohol use, and of note , the hospital reports notes polysubstance abuse - marijuana, benzos, cocaine, +drug screen in 02/2013.  She currently does not work, but she says access to alcohol is plentiful for her.  She has no trouble getting alcohol.   She says she drinks because it makes her feel good.  She hasn't lived with mother in quite some time, but mother and her have talked and she is agreeable to rehab.  She has never been in a rehab program, never had counseling.  Mom notes that there was no psychiatry consult with the recent hospitalization, and no arrangements were made for rehab or substance abuse care.  They were advised to establish with a primary care provider and to stop alcohol.    When asked about her relationships, she notes no relationship with her father, doesn't talk much to her siblings, and she starts crying when we start addressing this.   She denies any physical abuse from anyone.  She has no significant other/boyfriend currently.  She most recently worked at [employer] but quit that job.   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  Reason unclear.    She is mainly here today as she is still having nausea, vomiting, anxiety/nervousness, can't keep any food down without drinking some alcohol along with the food.  When she eats food by itself, vomits.   She notes hx/o clinical diagnosis of peptic ulcers, avoids NSAIDS and had some hematemesis upon her recent ED visit  and subsequent hospitalization.   She notes never having an EGD.  She takes tylenol prn.  She was discharged with 4 scripts, but only got the Ativan filled and is taking this, but still not helping.  She is not taking the Protonix, didn't get the Lortab, and the zofran was too expensive.    Mom wants help securing a rehab program and treating her current symptoms.   Regarding hx/o seizures noted in chart, mom says she has only ever had 1 seizure related to alcohol use back in 01/2012.   Never seen neurology or treated for seizure disorder.    No Known Allergies  Current Outpatient Prescriptions on File Prior to Visit  Medication Sig Dispense Refill  . LORazepam (ATIVAN) 0.5 MG tablet Take 1 tablet (0.5 mg total) by mouth every 8 (eight) hours as needed for anxiety.  15 tablet  0  . Multiple Vitamin (MULITIVITAMIN WITH MINERALS) TABS Take 1 tablet by mouth daily.       No current facility-administered medications on file prior to visit.    Past Medical History  Diagnosis Date  . Stomach ulcer   . Polysubstance abuse   . Adjustment disorder with emotional disturbance   . Metabolic acidosis 03/2013     hospitaliation due to alcohol abuse, dehyration, nausea/vomiting  . 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 Hematemesis 03/2013    along with alcoholic gastritis, hospitalization  .  Pyelonephritis 03/2010    hospitalization    Past Surgical History  Procedure Laterality Date  . Breast lumpectomy      right  . Cesarean section  2005    History reviewed. No pertinent family history.  History   Social History  . Marital Status: Single    Spouse Name: N/A    Number of Children: N/A  . Years of Education: N/A   Occupational History  . Not on file.   Social History Main Topics  . Smoking status: Current Every Day Smoker -- 1.00 packs/day    Types: Cigarettes  . Smokeless tobacco: Not on file  . Alcohol Use: Yes     Comment: binge drinking  . Drug Use: Yes     Special: Marijuana, Cocaine  . Sexually Active: Not on file   Other Topics Concern  . Not on file   Social History Narrative   Homeless, lives with friends, family, in hotels, random locations.  Mother is supportive.  No relationship with father.   Questionable relationship with siblings?  No significant other.  Unemployed as of 03/2013.    Reviewed their medical, surgical, family, social, medication, and allergy history and updated chart as appropriate.  Review of Systems Constitutional: -fever, +chills, -sweats, -unexpected weight change,-fatigue ENT: -runny nose, -ear pain, -sore throat Cardiology:  -chest pain, -palpitations, -edema Respiratory: -cough, -shortness of breath, -wheezing Gastroenterology: -abdominal pain, +nausea, +vomiting, -diarrhea, -constipation Hematology: -bleeding or bruising problems Musculoskeletal: -arthralgias, -myalgias, -joint swelling, -back pain Ophthalmology: -vision changes Urology: -dysuria, -difficulty urinating, -hematuria, -urinary frequency, -urgency Neurology: +headache, -weakness, -tingling, -numbness Psych: +nervous, anxiety, shakes, headache     Objective:   Physical Exam  Filed Vitals:   04/11/13 1106  BP: 100/68  Pulse: 96  Temp: 98.8 F (37.1 C)  Resp: 16    General appearance: alert, no distress, WD/WN, lean AA female Oral cavity: MMM, no lesions Neck: supple, no lymphadenopathy, no thyromegaly, no masses Heart: RRR, normal S1, S2, no murmurs Lungs: CTA bilaterally, no wheezes, rhonchi, or rales Abdomen: +bs, soft, non tender, non distended, no masses, no hepatomegaly, no splenomegaly Extremities: no edema, no cyanosis, no clubbing Pulses: 2+ symmetric, upper and lower extremities, normal cap refill Neurological: alert, oriented x 3, CN2-12 intact, +noticable tremor with movement, but not at rest, no obvious asterixis, otherwise strength normal upper extremities and lower extremities, sensation normal throughout, DTRs 2+  throughout, no cerebellar signs, gait normal Psychiatric: normal affect, good eye contact, crying in the exam room at times, pleasant    Assessment and Plan :    Encounter Diagnoses  Name Primary?  . Polysubstance abuse Yes  . Nausea with vomiting   . Anxiety   . Poor social situation   . Adjustment disorder with emotional disturbance   . Metabolic acidosis    After looking at recent hospitalization notes, labs, reviewing her history, and discussing her social situation, mood, substance abuse, and ongoing symptoms, given ongoing alcohol use, I advised they go back to [facility] ED for eval, psych eval, social worker assistance, and acute care for her nausea, anxiety.  Hopefully this will prompt acute psych eval, treatment, and subsequent placement in residential or other substance abuse treatment program.   Of note, I called [facility] intake.  They advised she go to [facility] ED.   I discussed case with both Dr. Susann Givens and Dr. Lynelle Doctor here, and we all were in agreement that next step would be eval in the ED.  Mom  will take her to [facility] ED. I called [facility] triage to advise of the situation.   Contact info: Caidyn [phone] cell Mother Bascom Levels - [phone]

## 2013-04-11 NOTE — Progress Notes (Addendum)
Patient shares that she was in the hospital from 04/04/2013 to 04/07/2013. When discharged on 4/12 patient stated she drank 1/2 pint or so of liquor and a beer. Patient stated on Sunday she drank a beer, and on Monday she drank a beer and a shot. CSW discussed with act team who stated patient would not meet criteria for inpatient detox since patient completed detox 04/04/2013 to 04/07/2013 while being in the hospital, and has not had anything to drink since Monday. CSW awaiting further medical evaluation to determine pt disposition needs.   Catha Gosselin, Theresia Majors  [phone] .04/11/2013 1513pm   CSW discussed with EDP who agreed with pt plan of care. Pt plans to follow up with [facility]. Patient and patient mother thanked csw for concern and support.   .No further Clinical Social Work needs, signing off.   Catha Gosselin, LCSWA  [phone] .04/11/2013 1537pm

## 2013-04-11 NOTE — ED Notes (Signed)
Pt wants to go to rehab for alcohol detox.  The last time she drank was 2 days ago.  Was sent here by "Kristian Covey".

## 2013-04-11 NOTE — ED Notes (Signed)
Pt states she was sent here by provider for medical clearance for detox.  Pt states rehab facility provider usually uses is unable to take pt until detox complete.  Pt denies SI/HI.  Pt refuses to change into blue scrubs or go to psych ED due to past experience.

## 2013-04-15 ENCOUNTER — Emergency Department (HOSPITAL_COMMUNITY)
Admission: EM | Admit: 2013-04-15 | Discharge: 2013-04-16 | Disposition: A | Discharge status: Home / Self Care | Payer: 59 | Attending: Emergency Medicine | Admitting: Emergency Medicine

## 2013-04-15 ENCOUNTER — Encounter (HOSPITAL_COMMUNITY): Payer: Self-pay | Admitting: Emergency Medicine

## 2013-04-15 DIAGNOSIS — Z8639 Personal history of other endocrine, nutritional and metabolic disease: Secondary | ICD-10-CM | POA: Insufficient documentation

## 2013-04-15 DIAGNOSIS — F17200 Nicotine dependence, unspecified, uncomplicated: Secondary | ICD-10-CM | POA: Insufficient documentation

## 2013-04-15 DIAGNOSIS — Z862 Personal history of diseases of the blood and blood-forming organs and certain disorders involving the immune mechanism: Secondary | ICD-10-CM | POA: Insufficient documentation

## 2013-04-15 DIAGNOSIS — K5289 Other specified noninfective gastroenteritis and colitis: Secondary | ICD-10-CM | POA: Insufficient documentation

## 2013-04-15 DIAGNOSIS — Z79899 Other long term (current) drug therapy: Secondary | ICD-10-CM | POA: Insufficient documentation

## 2013-04-15 DIAGNOSIS — K529 Noninfective gastroenteritis and colitis, unspecified: Secondary | ICD-10-CM

## 2013-04-15 DIAGNOSIS — R6883 Chills (without fever): Secondary | ICD-10-CM | POA: Insufficient documentation

## 2013-04-15 DIAGNOSIS — Z3202 Encounter for pregnancy test, result negative: Secondary | ICD-10-CM | POA: Insufficient documentation

## 2013-04-15 DIAGNOSIS — R112 Nausea with vomiting, unspecified: Secondary | ICD-10-CM | POA: Insufficient documentation

## 2013-04-15 DIAGNOSIS — Z8659 Personal history of other mental and behavioral disorders: Secondary | ICD-10-CM | POA: Insufficient documentation

## 2013-04-15 DIAGNOSIS — Z87448 Personal history of other diseases of urinary system: Secondary | ICD-10-CM | POA: Insufficient documentation

## 2013-04-15 DIAGNOSIS — Z8719 Personal history of other diseases of the digestive system: Secondary | ICD-10-CM | POA: Insufficient documentation

## 2013-04-15 LAB — BASIC METABOLIC PANEL
Calcium: 9.7 mg/dL (ref 8.4–10.5)
Creatinine, Ser: 0.68 mg/dL (ref 0.50–1.10)
GFR calc Af Amer: >90 mL/min (ref >90)
GFR calc non Af Amer: >90 mL/min (ref >90)
Sodium: 137 mEq/L (ref 135–145)

## 2013-04-15 LAB — POCT PREGNANCY, URINE: Preg Test, Ur: NEGATIVE

## 2013-04-15 LAB — CBC WITH DIFFERENTIAL/PLATELET
Basophils Absolute: 0 10*3/uL (ref 0.0–0.1)
Eosinophils Absolute: 0 10*3/uL (ref 0.0–0.7)
Eosinophils Relative: 0 % (ref 0–5)
Lymphocytes Relative: 17 % (ref 12–46)
Lymphs Abs: 1.8 10*3/uL (ref 0.7–4.0)
MCV: 92.9 fL (ref 78.0–100.0)
Neutrophils Relative %: 74 % (ref 43–77)
Platelets: 302 10*3/uL (ref 150–400)
RBC: 4.23 MIL/uL (ref 3.87–5.11)
RDW: 13.8 % (ref 11.5–15.5)
WBC: 10.4 10*3/uL (ref 4.0–10.5)

## 2013-04-15 MED ORDER — SODIUM CHLORIDE 0.9 % IV SOLN: 1000.0000 mL | INTRAVENOUS | Status: DC

## 2013-04-15 MED ORDER — LOPERAMIDE HCL 2 MG PO CAPS
4.0000 mg | ORAL_CAPSULE | Freq: Once | ORAL | Status: DC
  Filled 2013-04-15: qty 2

## 2013-04-15 MED ORDER — SODIUM CHLORIDE 0.9 % IV SOLN
1000.0000 mL | Freq: Once | INTRAVENOUS | Status: AC
  Administered 2013-04-15: 1000 mL via INTRAVENOUS

## 2013-04-15 MED ORDER — ONDANSETRON HCL 4 MG/2ML IJ SOLN
4.0000 mg | Freq: Once | INTRAMUSCULAR | Status: AC
  Administered 2013-04-15: 4 mg via INTRAVENOUS
  Filled 2013-04-15: qty 2

## 2013-04-15 MED ORDER — MORPHINE SULFATE 4 MG/ML IJ SOLN
4.0000 mg | Freq: Once | INTRAMUSCULAR | Status: AC
  Administered 2013-04-15: 4 mg via INTRAVENOUS
  Filled 2013-04-15: qty 1

## 2013-04-15 NOTE — ED Notes (Signed)
PIV attempted x2.

## 2013-04-15 NOTE — ED Notes (Signed)
Per EMS, pt is from [facility]. She began having n/v/d around 1330 and has been unable to keep anything down. Imodium given at 1700, Prilosec 40 mg (time unknown), and 50 mg Phenergan IM at 1830. Pt still dry heaving on arrival. Pt tested positive for gonorrhea this morning and started her on abx today. Unsure of preg status.  Mother is in route. Hx of seizures r/t withdrawal.

## 2013-04-15 NOTE — ED Notes (Signed)
HQI:ON62<XB> Expected date:04/15/13<BR> Expected time: 7:19 PM<BR> Means of arrival:Ambulance<BR> Comments:<BR> abd pain, nausea

## 2013-04-15 NOTE — ED Provider Notes (Signed)
History     CSN: [id number]  Arrival date & time 04/15/13  1929   First MD Initiated Contact with Patient 04/15/13 1937      Chief Complaint  Patient presents with  . Emesis  . Diarrhea    (Consider location/radiation/quality/duration/timing/severity/associated sxs/prior treatment) Patient is a 27 y.o. female presenting with vomiting and diarrhea. The history is provided by the patient.  Emesis Associated symptoms: diarrhea   Diarrhea Associated symptoms: vomiting   She onset at 2:30 PM of severe nausea, vomiting, diarrhea. She was given Zofran and Imodium with no relief. She vomited after being given the Imodium. She has had chills but no fever or sweats. She's complaining of abdominal soreness from vomiting and some of the soreness is in her back as well.  Pain is worse before vomiting. She denies any sick contacts and denies eating any unusual foods.  Past Medical History  Diagnosis Date  . Stomach ulcer   . Polysubstance abuse   . Adjustment disorder with emotional disturbance   . Metabolic acidosis 03/2013     hospitaliation due to alcohol abuse, dehyration, nausea/vomiting  . 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 Hematemesis 03/2013    along with alcoholic gastritis, hospitalization  . Pyelonephritis 03/2010    hospitalization    Past Surgical History  Procedure Laterality Date  . Breast lumpectomy      right  . Cesarean section  2005    No family history on file.  History  Substance Use Topics  . Smoking status: Current Every Day Smoker -- 1.00 packs/day    Types: Cigarettes  . Smokeless tobacco: Never Used  . Alcohol Use: Yes     Comment: binge drinking    OB History   Grav Para Term Preterm Abortions TAB SAB Ect Mult Living                  Review of Systems  Gastrointestinal: Positive for vomiting and diarrhea.  All other systems reviewed and are negative.    Allergies  Review of patient's allergies indicates no known  allergies.  Home Medications   Current Outpatient Rx  Name  Route  Sig  Dispense  Refill  . acetaminophen (TYLENOL) 500 MG tablet   Oral   Take 1,000 mg by mouth every 6 (six) hours as needed for pain.         Marland Kitchen LORazepam (ATIVAN) 0.5 MG tablet   Oral   Take 1 tablet (0.5 mg total) by mouth every 8 (eight) hours as needed for anxiety.   15 tablet   0   . LORazepam (ATIVAN) 1 MG tablet   Oral   Take 1 tablet (1 mg total) by mouth 3 (three) times daily as needed for anxiety.   20 tablet   0   . Multiple Vitamin (MULITIVITAMIN WITH MINERALS) TABS   Oral   Take 1 tablet by mouth daily.         . promethazine (PHENERGAN) 25 MG tablet   Oral   Take 1 tablet (25 mg total) by mouth every 6 (six) hours as needed for nausea.   20 tablet   0     BP 136/63  Pulse 64  Temp(Src) 99.2 F (37.3 C) (Oral)  Resp 18  SpO2 100%  LMP 03/26/2013  Physical Exam  Nursing note and vitals reviewed.  27 year old female, who appears uncomfortable, but is in no acute distress. Vital signs are normal. Oxygen saturation is 100%,  which is normal. Head is normocephalic and atraumatic. PERRLA, EOMI. Oropharynx is clear. Neck is nontender and supple without adenopathy or JVD. Back is nontender and there is no CVA tenderness. Lungs are clear without rales, wheezes, or rhonchi. Chest is nontender. Heart has regular rate and rhythm without murmur. Abdomen is soft, flat, with some mild tenderness diffusely. There is no rebound or guarding. There are no masses or hepatosplenomegaly and peristalsis is normoactive. Extremities have no cyanosis or edema, full range of motion is present. Skin is warm and dry without rash. Neurologic: Mental status is normal, cranial nerves are intact, there are no motor or sensory deficits.  ED Course  Procedures (including critical care time)  Results for orders placed during the hospital encounter of 04/15/13  BASIC METABOLIC PANEL      Result Value Range    Sodium 137  135 - 145 mEq/L   Potassium 3.3 (*) 3.5 - 5.1 mEq/L   Chloride 101  96 - 112 mEq/L   CO2 23  19 - 32 mEq/L   Glucose, Bld 89  70 - 99 mg/dL   BUN 5 (*) 6 - 23 mg/dL   Creatinine, Ser 9.60  0.50 - 1.10 mg/dL   Calcium 9.7  8.4 - 45.4 mg/dL   GFR calc non Af Amer >90  >90 mL/min   GFR calc Af Amer >90  >90 mL/min  CBC WITH DIFFERENTIAL      Result Value Range   WBC 10.4  4.0 - 10.5 K/uL   RBC 4.23  3.87 - 5.11 MIL/uL   Hemoglobin 14.0  12.0 - 15.0 g/dL   HCT 09.8  11.9 - 14.7 %   MCV 92.9  78.0 - 100.0 fL   MCH 33.1  26.0 - 34.0 pg   MCHC 35.6  30.0 - 36.0 g/dL   RDW 82.9  56.2 - 13.0 %   Platelets 302  150 - 400 K/uL   Neutrophils Relative 74  43 - 77 %   Neutro Abs 7.7  1.7 - 7.7 K/uL   Lymphocytes Relative 17  12 - 46 %   Lymphs Abs 1.8  0.7 - 4.0 K/uL   Monocytes Relative 8  3 - 12 %   Monocytes Absolute 0.9  0.1 - 1.0 K/uL   Eosinophils Relative 0  0 - 5 %   Eosinophils Absolute 0.0  0.0 - 0.7 K/uL   Basophils Relative 0  0 - 1 %   Basophils Absolute 0.0  0.0 - 0.1 K/uL  POCT PREGNANCY, URINE      Result Value Range   Preg Test, Ur NEGATIVE  NEGATIVE    1. Gastroenteritis       MDM  Vomiting and diarrhea most likely a viral gastroenteritis. She'll be given IV fluids, IV ondansetron, and oral loperamide. She'll be given morphine for pain.  She feels much better after above noted treatment. She is discharged with prescription for metoclopramide for nausea and is told to use over-the-counter loperamide for diarrhea.     Dione Booze, MD 04/16/13 [phone]

## 2013-04-15 NOTE — ED Notes (Signed)
Pt aware urine sample is needed 

## 2013-04-16 LAB — URINALYSIS, ROUTINE W REFLEX MICROSCOPIC
Glucose, UA: NEGATIVE mg/dL
Ketones, ur: 40 mg/dL — AB
Nitrite: NEGATIVE
Protein, ur: NEGATIVE mg/dL
pH: 8.5 — ABNORMAL HIGH (ref 5.0–8.0)

## 2013-04-16 MED ORDER — METOCLOPRAMIDE HCL 10 MG PO TABS: 10.0000 mg | ORAL_TABLET | Freq: Four times a day (QID) | ORAL | Status: DC

## 2013-04-17 ENCOUNTER — Emergency Department (HOSPITAL_COMMUNITY): Payer: 59

## 2013-04-17 ENCOUNTER — Encounter (HOSPITAL_COMMUNITY): Payer: Self-pay | Admitting: Radiology

## 2013-04-17 ENCOUNTER — Inpatient Hospital Stay (HOSPITAL_COMMUNITY)
Admission: EM | Admit: 2013-04-17 | Discharge: 2013-04-22 | DRG: 392 | Disposition: A | Discharge status: Home / Self Care | Payer: 59 | Attending: Internal Medicine | Admitting: Internal Medicine

## 2013-04-17 DIAGNOSIS — F1410 Cocaine abuse, uncomplicated: Secondary | ICD-10-CM | POA: Diagnosis present

## 2013-04-17 DIAGNOSIS — D649 Anemia, unspecified: Secondary | ICD-10-CM | POA: Diagnosis present

## 2013-04-17 DIAGNOSIS — F1020 Alcohol dependence, uncomplicated: Secondary | ICD-10-CM | POA: Diagnosis present

## 2013-04-17 DIAGNOSIS — E876 Hypokalemia: Secondary | ICD-10-CM | POA: Diagnosis present

## 2013-04-17 DIAGNOSIS — R197 Diarrhea, unspecified: Secondary | ICD-10-CM | POA: Diagnosis present

## 2013-04-17 DIAGNOSIS — F1210 Cannabis abuse, uncomplicated: Secondary | ICD-10-CM | POA: Diagnosis present

## 2013-04-17 DIAGNOSIS — Z5900 Homelessness unspecified: Secondary | ICD-10-CM

## 2013-04-17 DIAGNOSIS — Z79899 Other long term (current) drug therapy: Secondary | ICD-10-CM

## 2013-04-17 DIAGNOSIS — K2970 Gastritis, unspecified, without bleeding: Secondary | ICD-10-CM

## 2013-04-17 DIAGNOSIS — R1115 Cyclical vomiting syndrome unrelated to migraine: Secondary | ICD-10-CM

## 2013-04-17 DIAGNOSIS — R1013 Epigastric pain: Secondary | ICD-10-CM

## 2013-04-17 DIAGNOSIS — Z720 Tobacco use: Secondary | ICD-10-CM

## 2013-04-17 DIAGNOSIS — K2920 Alcoholic gastritis without bleeding: Secondary | ICD-10-CM | POA: Diagnosis present

## 2013-04-17 DIAGNOSIS — E860 Dehydration: Secondary | ICD-10-CM

## 2013-04-17 DIAGNOSIS — K2990 Gastroduodenitis, unspecified, without bleeding: Secondary | ICD-10-CM

## 2013-04-17 DIAGNOSIS — K2960 Other gastritis without bleeding: Principal | ICD-10-CM | POA: Diagnosis present

## 2013-04-17 DIAGNOSIS — T3995XA Adverse effect of unspecified nonopioid analgesic, antipyretic and antirheumatic, initial encounter: Secondary | ICD-10-CM | POA: Diagnosis present

## 2013-04-17 DIAGNOSIS — Z791 Long term (current) use of non-steroidal anti-inflammatories (NSAID): Secondary | ICD-10-CM

## 2013-04-17 DIAGNOSIS — F17200 Nicotine dependence, unspecified, uncomplicated: Secondary | ICD-10-CM | POA: Diagnosis present

## 2013-04-17 DIAGNOSIS — R112 Nausea with vomiting, unspecified: Secondary | ICD-10-CM

## 2013-04-17 DIAGNOSIS — E872 Acidosis: Secondary | ICD-10-CM

## 2013-04-17 DIAGNOSIS — K920 Hematemesis: Secondary | ICD-10-CM

## 2013-04-17 HISTORY — DX: Epigastric pain: R10.13

## 2013-04-17 LAB — CBC WITH DIFFERENTIAL/PLATELET
Basophils Absolute: 0 10*3/uL (ref 0.0–0.1)
Basophils Relative: 0 % (ref 0–1)
Eosinophils Absolute: 0.1 10*3/uL (ref 0.0–0.7)
Eosinophils Relative: 1 % (ref 0–5)
HCT: 37.3 % (ref 36.0–46.0)
Hemoglobin: 12.9 g/dL (ref 12.0–15.0)
MCH: 32.3 pg (ref 26.0–34.0)
MCHC: 34.6 g/dL (ref 30.0–36.0)
Monocytes Absolute: 0.7 10*3/uL (ref 0.1–1.0)
Monocytes Relative: 10 % (ref 3–12)
RDW: 13.7 % (ref 11.5–15.5)

## 2013-04-17 LAB — COMPREHENSIVE METABOLIC PANEL
ALT: 17 U/L (ref 0–35)
AST: 22 U/L (ref 0–37)
Alkaline Phosphatase: 66 U/L (ref 39–117)
CO2: 23 mEq/L (ref 19–32)
Chloride: 102 mEq/L (ref 96–112)
GFR calc Af Amer: >90 mL/min (ref >90)
GFR calc non Af Amer: >90 mL/min (ref >90)
Glucose, Bld: 82 mg/dL (ref 70–99)

## 2013-04-17 MED ORDER — PANTOPRAZOLE SODIUM 40 MG IV SOLR: 40.0000 mg | Freq: Once | INTRAVENOUS | Status: DC

## 2013-04-17 MED ORDER — SUCRALFATE 1 G PO TABS
1.0000 g | ORAL_TABLET | Freq: Once | ORAL | Status: AC
  Administered 2013-04-17: 1 g via ORAL
  Filled 2013-04-17: qty 1

## 2013-04-17 MED ORDER — PANTOPRAZOLE SODIUM 40 MG IV SOLR
40.0000 mg | INTRAVENOUS | Status: DC
  Filled 2013-04-17 (×2): qty 40

## 2013-04-17 MED ORDER — ONDANSETRON HCL 4 MG/2ML IJ SOLN
4.0000 mg | Freq: Once | INTRAMUSCULAR | Status: AC
  Administered 2013-04-17: 4 mg via INTRAVENOUS
  Filled 2013-04-17: qty 2

## 2013-04-17 MED ORDER — ONDANSETRON HCL 4 MG/2ML IJ SOLN
4.0000 mg | Freq: Four times a day (QID) | INTRAMUSCULAR | Status: DC | PRN
  Administered 2013-04-17 – 2013-04-18 (×3): 4 mg via INTRAVENOUS
  Filled 2013-04-17 (×3): qty 2

## 2013-04-17 MED ORDER — ONDANSETRON HCL 4 MG PO TABS: 4.0000 mg | ORAL_TABLET | Freq: Four times a day (QID) | ORAL | Status: DC | PRN

## 2013-04-17 MED ORDER — SUCRALFATE 1 GM/10ML PO SUSP
1.0000 g | Freq: Three times a day (TID) | ORAL | Status: DC
  Administered 2013-04-17 – 2013-04-22 (×16): 1 g via ORAL
  Filled 2013-04-17 (×23): qty 10

## 2013-04-17 MED ORDER — SODIUM CHLORIDE 0.9 % IV BOLUS (SEPSIS)
1000.0000 mL | Freq: Once | INTRAVENOUS | Status: AC
  Administered 2013-04-17: 1000 mL via INTRAVENOUS

## 2013-04-17 MED ORDER — IOHEXOL 300 MG/ML  SOLN
50.0000 mL | Freq: Once | INTRAMUSCULAR | Status: AC | PRN
  Administered 2013-04-17: 50 mL via ORAL

## 2013-04-17 MED ORDER — OXYCODONE HCL 5 MG PO TABS
5.0000 mg | ORAL_TABLET | ORAL | Status: DC | PRN
  Administered 2013-04-19 – 2013-04-20 (×3): 5 mg via ORAL
  Filled 2013-04-17 (×3): qty 1

## 2013-04-17 MED ORDER — ACETAMINOPHEN 650 MG RE SUPP: 650.0000 mg | Freq: Four times a day (QID) | RECTAL | Status: DC | PRN

## 2013-04-17 MED ORDER — MORPHINE SULFATE 2 MG/ML IJ SOLN
0.5000 mg | INTRAMUSCULAR | Status: DC | PRN
  Administered 2013-04-17 – 2013-04-21 (×6): 0.5 mg via INTRAVENOUS
  Filled 2013-04-17 (×6): qty 1

## 2013-04-17 MED ORDER — GI COCKTAIL ~~LOC~~
30.0000 mL | Freq: Once | ORAL | Status: AC
  Administered 2013-04-17: 30 mL via ORAL
  Filled 2013-04-17: qty 30

## 2013-04-17 MED ORDER — POTASSIUM CHLORIDE CRYS ER 20 MEQ PO TBCR
40.0000 meq | EXTENDED_RELEASE_TABLET | Freq: Two times a day (BID) | ORAL | Status: DC
  Administered 2013-04-17: 40 meq via ORAL
  Filled 2013-04-17 (×2): qty 2

## 2013-04-17 MED ORDER — ENOXAPARIN SODIUM 40 MG/0.4ML ~~LOC~~ SOLN
40.0000 mg | SUBCUTANEOUS | Status: DC
  Administered 2013-04-18 – 2013-04-21 (×4): 40 mg via SUBCUTANEOUS
  Filled 2013-04-17 (×6): qty 0.4

## 2013-04-17 MED ORDER — ACETAMINOPHEN 325 MG PO TABS
650.0000 mg | ORAL_TABLET | Freq: Four times a day (QID) | ORAL | Status: DC | PRN
  Administered 2013-04-21: 650 mg via ORAL
  Filled 2013-04-17: qty 2

## 2013-04-17 MED ORDER — LOPERAMIDE HCL 2 MG PO CAPS
2.0000 mg | ORAL_CAPSULE | ORAL | Status: DC | PRN
  Administered 2013-04-17 – 2013-04-18 (×2): 2 mg via ORAL
  Filled 2013-04-17 (×2): qty 1

## 2013-04-17 MED ORDER — IOHEXOL 300 MG/ML  SOLN
100.0000 mL | Freq: Once | INTRAMUSCULAR | Status: AC | PRN
  Administered 2013-04-17: 100 mL via INTRAVENOUS

## 2013-04-17 MED ORDER — SODIUM CHLORIDE 0.9 % IV SOLN
80.0000 mg | Freq: Once | INTRAVENOUS | Status: AC
  Administered 2013-04-17: 80 mg via INTRAVENOUS
  Filled 2013-04-17: qty 80

## 2013-04-17 MED ORDER — KETOROLAC TROMETHAMINE 30 MG/ML IJ SOLN
30.0000 mg | Freq: Once | INTRAMUSCULAR | Status: AC
  Administered 2013-04-17: 30 mg via INTRAVENOUS
  Filled 2013-04-17: qty 1

## 2013-04-17 NOTE — ED Notes (Signed)
Pt reports vomiting a little bit after fluid challenge. PA notified.

## 2013-04-17 NOTE — ED Notes (Signed)
Pt was transferred from a detox/rehab;[facility]. Pt was seen for same complaint yesterday of N/V. Pt was give 4mg  Zofran en route.VSS

## 2013-04-17 NOTE — Care Management (Signed)
UR complete  Ruddy Swire,RN,BSN [phone] 

## 2013-04-17 NOTE — ED Notes (Signed)
Pt given water and crackers for fluid challenge as requested by PA Robyn. Pt able to eat and drink with no N/V at this moment. Will reassess in 15 minutes.

## 2013-04-17 NOTE — Consult Note (Signed)
[facility] Consult: 2:38 PM 04/17/2013   Referring Provider: Dr Blake Divine  Primary Care Physician:  Ernst Breach, PA-C Primary Gastroenterologist:  unassigned  Reason for Consultation:  Nausea and vomiting.   HPI: [name] is an essentially homeless 27 y.o. female.  She is an alcoholic since age 21 and substance abuser.  Told several years ago she probably had a stomach ulcer but no test or EGD at the time.    Admitted 4/9 - 04/07/13 to hospital for nausea, vomiting and abdominal pain. 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 . Anion gap metabolic acidosis by labs.  Limited hematemesis. Discharged on once daily Protonix and prn Zofran.  LFTs normal on admission. Was not tolerating POs well at discharge.   Did not take GI meds after discharge. Seen in outpt office on 4/16 and rxd with Phenergen for ongoing vomiting and abdominal pain.  Admitted to [facility] 4/17.  While there did not receive any GI meds or antinauseals.  They did prescribe up to 1800 mg of Ibuprofen daily for ongoing generalized upper/mid abdominal pain.   Sent for [facility] to ED 4/20 with persistent emesis and diarrhea, no relief with Imodium or Zofran. Labs, including urine pregnancy test, were negative.  ED provided Rx for Reglan prn and suggested using OTC Loperamide.    Sent back today to ED with same: diffuse abdominal pain, nausea, emesis (non bloody).  Non melenic loose stools:  Last BM was 4/21.  LFTs still normal and Ct abdomen suggestive of gastritis.  No liver or biliary disease on ultrasound or the CT.  No substance or ETOH use since 4/16.  Previously drinking up to 12 pack malt liquor and/or 750 ml spirits daily.  Tested negative for HIV and viral hepatitis in past, but no records of this in Epic.  It may have been in 2005 during pregnancy.       Past Medical History  Diagnosis Date  . Stomach ulcer   .  Polysubstance abuse   . Adjustment disorder with emotional disturbance   . Metabolic acidosis 03/2013     hospitaliation due to alcohol abuse, dehyration, nausea/vomiting  . 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 Hematemesis 03/2013    along with alcoholic gastritis, hospitalization  . Pyelonephritis 03/2010    hospitalization    Past Surgical History  Procedure Laterality Date  . Breast lumpectomy      right  . Cesarean section  2005    Prior to Admission medications   Medication Sig Start Date End Date Taking? Authorizing Provider  ibuprofen (ADVIL,MOTRIN) 600 MG tablet Take 600 mg by mouth every 6 (six) hours as needed for pain.   Yes Historical Provider, MD  Multiple Vitamin (MULITIVITAMIN WITH MINERALS) TABS Take 1 tablet by mouth daily.   Yes Historical Provider, MD  ondansetron (ZOFRAN) 8 MG tablet Take 8 mg by mouth every 8 (eight) hours as needed for nausea.   Yes Historical Provider, MD  promethazine (PHENERGAN) 25 MG tablet Take 1 tablet (25 mg total) by mouth every 6 (six) hours as needed for nausea. 04/11/13  Yes Donnetta Hutching, MD    Scheduled Meds:   Infusions: . pantoprazole (PROTONIX) IV 80 mg (04/17/13 1423)   PRN Meds: loperamide   Allergies as of 04/17/2013  . (No Known Allergies)    Family history. Both grandfathers were alcoholic DM, htn in various relatives  History   Social History  . Marital Status: Single  Spouse Name: N/A    Number of Children: N/A  . Years of Education: N/A   Occupational History  . Not on file.   Social History Main Topics  . Smoking status: Current Every Day Smoker -- 1.00 packs/day    Types: Cigarettes  . Smokeless tobacco: Never Used  . Alcohol Use: Yes     Comment: binge drinking  . Drug Use: Yes    Special: Marijuana, Cocaine  . Sexually Active: No   Other Topics Concern  . Not on file   Social History Narrative   Homeless, lives with friends, family, in hotels, random locations.  Mother is  supportive.  No relationship with father.   Questionable relationship with siblings?  No significant other.  Unemployed as of 03/2013.    REVIEW OF SYSTEMS: Constitutional:  No weight loss of more than 5 #.  General malaise.  No sick contacts ENT:  No nose bleeds Pulm:  No SOB, no cough CV:  No palpitations, tachycardia or chest pain.  GU:  No blood in urine GYN:  No excessive bleeding or irregular periods GI:  Per HPI Heme:  Prescribed iron in past, not during pregnancy.    Transfusions:  None  Neuro:  No headache, no falls , no dizziness Derm:  No rash, no sores, no itching Endocrine:  No hx thyroid disease Immunization:  Last tetanus shot unknown Travel:  None beyond 50 miles   PHYSICAL EXAM: Vital signs in last 24 hours: Temp:  [98.2 F (36.8 C)] 98.2 F (36.8 C) (04/22 0537) Pulse Rate:  [49-82] 49 (04/22 1200) Resp:  [20] 20 (04/22 0537) BP: (115-151)/(58-80) 151/75 mmHg (04/22 1200) SpO2:  [97 %-100 %] 100 % (04/22 1200)  General: tremulous, looks ill and unwell Head:  Slightly edematous faces.  Eyes:  No icterus or pallor Ears:  Not HOH  Nose:  No discharge or sneezing or congestion Mouth:  Clear, moist oral MM Neck:  No JVD .  No thyromegaly.  Lungs:  Unlabored breathing.  Clear to ausculatation B.   Heart: RRR.  Not tachy. Abdomen:  Soft, ND, diffuse mild tenderness is non-focal. .   Rectal: not done   Musc/Skeltl: no joint swelling or deformity Extremities:  No pedal edema  Neurologic:  Tremors of UE and trunk, accelerate with activity.  No asterixis Skin:  No palmar erythema, no jaundice.  Tattoos:  None seen. Nodes:  No inguinal adenopathy   Psych:  Cooperative, oriented x 3.  Flat affect:  Looks depressed.   Intake/Output from previous day:   Intake/Output this shift:    LAB RESULTS:  Recent Labs  04/15/13 2104 04/17/13 0635  WBC 10.4 7.1  HGB 14.0 12.9  HCT 39.3 37.3  PLT 302 303   BMET Lab Results  Component Value Date   NA 137  04/17/2013   NA 137 04/15/2013   NA 135 04/11/2013   K 3.4* 04/17/2013   K 3.3* 04/15/2013   K 3.5 04/11/2013   CL 102 04/17/2013   CL 101 04/15/2013   CL 96 04/11/2013   CO2 23 04/17/2013   CO2 23 04/15/2013   CO2 26 04/11/2013   GLUCOSE 82 04/17/2013   GLUCOSE 89 04/15/2013   GLUCOSE 184* 04/11/2013   BUN 5* 04/17/2013   BUN 5* 04/15/2013   BUN 6 04/11/2013   CREATININE 0.57 04/17/2013   CREATININE 0.68 04/15/2013   CREATININE 0.70 04/11/2013   CALCIUM 9.3 04/17/2013   CALCIUM 9.7 04/15/2013   CALCIUM 9.6 04/11/2013  LFT  Recent Labs  04/17/13 0635  PROT 7.4  ALBUMIN 4.1  AST 22  ALT 17  ALKPHOS 66  BILITOT 0.3   PT/INR Lab Results  Component Value Date   INR 1.05 04/04/2013   Hepatitis Panel No results found for this basename: HEPBSAG, HCVAB, HEPAIGM, HEPBIGM,  in the last 72 hours C-Diff No components found with this basename: cdiff    Drugs of Abuse     Component Value Date/Time   LABOPIA NONE DETECTED 04/11/2013 1422   COCAINSCRNUR POSITIVE* 04/11/2013 1422   LABBENZ NONE DETECTED 04/11/2013 1422   AMPHETMU NONE DETECTED 04/11/2013 1422   THCU POSITIVE* 04/11/2013 1422   LABBARB NONE DETECTED 04/11/2013 1422     RADIOLOGY STUDIES: US Abdomen Complete 04/17/2013 Findings:  Gallbladder:  No gallstones, gallbladder wall thickening, or pericholecystic fluid.  Common Bile Duct:  Within normal limits in caliber.  Liver: No focal mass lesion identified.  Within normal limits in parenchymal echogenicity.  IVC:  Appears normal.  Pancreas:  No abnormality identified.  Spleen:  Within normal limits in size and echotexture.  Right kidney:  Normal in size and parenchymal echogenicity.  No evidence of mass or hydronephrosis.  Left kidney:  Normal in size and parenchymal echogenicity.  No evidence of mass or hydronephrosis.  Abdominal Aorta:  No aneurysm identified.  IMPRESSION: Negative abdominal ultrasound.   Original Report Authenticated By: Signa Kell, M.D.    Ct Abdomen Pelvis W  Contrast 04/17/2013  Findings: The liver, spleen, biliary tree, pancreas, adrenal glands, and kidneys are normal. There is slight mucosal thickening of the distal antrum of the stomach without evidence of ulceration. The stomach is not distended which could account for the mucosal prominence.  The large and small bowel are normal including the terminal ileum and appendix.  The uterus and left ovary are normal.  There is a 3.2 cm cyst on the right ovary.  This cyst is posterior to the lower uterine segment in the cul-de-sac and there is a small amount of fluid in the cul-de-sac, which is new.  This amount of fluid is not abnormal for a female of this age.  No osseous abnormality.  IMPRESSION:  1.  3.2 cm cyst on the right ovary in the cul-de-sac with a small amount of free fluid in the pelvic cul-de-sac. 2. Slightly prominent mucosa of the distal antrum of the stomach which could represent gastritis. 3.  Otherwise benign-appearing abdomen pelvis.   Original Report Authenticated By: Francene Boyers, M.D.     ENDOSCOPIC STUDIES: Nothing in Epic  IMPRESSION: *  Persistent n/v/non-bloody emesis and abdominal pain.  Agree this is likely ETOH gastritis.  The recent Ibuprofen also acting as gastric iritant.   Has been inadequately treated since discharge and stay at [facility].   *  Hypokalemia. *  Chronic ETOH and polysubstance abuse.    PLAN: *  Start Protonix BID, IV to start.  If nausea persists, change from PRN anti-nauseals to around the clock.  *  Given her tremulousness, would not add Reglan.  *  Not clear she needs EGD, will defer decision to MD.  If done will likely need MAC anesthesia.    LOS: 0 days   Jennye Moccasin  04/17/2013, 2:38 PM Pager: [phone]  Chart was reviewed and patient was examined. X-rays and lab were reviewed.   Symptoms can all be explained by acute gastritis, probably from Etoh and NSAIDs.  Recommend meds as above.  Hold off endoscopic studies for now.  Molly Maduro  Rosalio Macadamia, M.D.,  [facility] Cell [phone]

## 2013-04-17 NOTE — ED Provider Notes (Signed)
History     CSN: [id number]  Arrival date & time 04/17/13  0534   First MD Initiated Contact with Patient 04/17/13 [phone]      Chief Complaint  Patient presents with  . Nausea    (Consider location/radiation/quality/duration/timing/severity/associated sxs/prior treatment) HPI Comments: 27 year old female with a past medical history of stomach ulcer, alcoholic gastritis, adjustment disorder and polysubstance abuse presents back to the emergency department from the detox facility [facility] after being discharged from the ED one day ago. Patient was seen yesterday complaining of abdominal pain, nausea and vomiting. Per yesterday's note, patient was feeling much better after she was given IV fluids, Zofran and loperamide along with morphine for pain. Patient states shortly after she got back to [facility] for nausea and vomiting began again. She has vomited multiple times since. Tried drinking fluids but has not been able to keep them down. Abdominal pain has worsened and is located in the epigastric area radiating to the right upper quadrant described as sharp, cramping rated 10/10. Had a fever of 99.9 at the facility earlier today and was given Tylenol which broke the fever. Diarrhea has subsided since yesterday. She has been at [facility] for a little over a week now and has been clean since. Withdrawal symptoms have passed.   The history is provided by the patient.    Past Medical History  Diagnosis Date  . Stomach ulcer   . Polysubstance abuse   . Adjustment disorder with emotional disturbance   . Metabolic acidosis 03/2013     hospitaliation due to alcohol abuse, dehyration, nausea/vomiting  . 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 Hematemesis 03/2013    along with alcoholic gastritis, hospitalization  . Pyelonephritis 03/2010    hospitalization    Past Surgical History  Procedure Laterality Date  . Breast lumpectomy      right  . Cesarean section  2005     No family history on file.  History  Substance Use Topics  . Smoking status: Current Every Day Smoker -- 1.00 packs/day    Types: Cigarettes  . Smokeless tobacco: Never Used  . Alcohol Use: Yes     Comment: binge drinking    OB History   Grav Para Term Preterm Abortions TAB SAB Ect Mult Living                  Review of Systems  Constitutional: Positive for fever and appetite change.  Respiratory: Negative for shortness of breath.   Cardiovascular: Negative for chest pain.  Gastrointestinal: Positive for nausea, vomiting and abdominal pain. Negative for diarrhea.  Genitourinary: Negative for vaginal bleeding, vaginal discharge, difficulty urinating and pelvic pain.  Musculoskeletal: Negative for back pain.  All other systems reviewed and are negative.    Allergies  Review of patient's allergies indicates no known allergies.  Home Medications   Current Outpatient Rx  Name  Route  Sig  Dispense  Refill  . ibuprofen (ADVIL,MOTRIN) 600 MG tablet   Oral   Take 600 mg by mouth every 6 (six) hours as needed for pain.         . Multiple Vitamin (MULITIVITAMIN WITH MINERALS) TABS   Oral   Take 1 tablet by mouth daily.         . ondansetron (ZOFRAN) 8 MG tablet   Oral   Take 8 mg by mouth every 8 (eight) hours as needed for nausea.         . promethazine (  PHENERGAN) 25 MG tablet   Oral   Take 1 tablet (25 mg total) by mouth every 6 (six) hours as needed for nausea.   20 tablet   0     BP 124/68  Pulse 58  Temp(Src) 98.2 F (36.8 C) (Oral)  Resp 20  SpO2 100%  LMP 03/26/2013  Physical Exam  Nursing note and vitals reviewed. Constitutional: She is oriented to person, place, and time. She appears well-developed and well-nourished. No distress.  HENT:  Head: Normocephalic and atraumatic.  Mouth/Throat: Oropharynx is clear and moist.  Eyes: Conjunctivae are normal. No scleral icterus.  Neck: Normal range of motion. Neck supple.  Cardiovascular:  Normal rate, regular rhythm and normal heart sounds.   Pulmonary/Chest: Effort normal and breath sounds normal. No respiratory distress.  Abdominal: Soft. Normal appearance and bowel sounds are normal. There is tenderness in the right upper quadrant and epigastric area. There is guarding. There is no rigidity, no rebound, no CVA tenderness, no tenderness at McBurney's point and negative Murphy's sign.  Musculoskeletal: Normal range of motion. She exhibits no edema.  Neurological: She is alert and oriented to person, place, and time.  Skin: Skin is warm and dry.  Psychiatric: She has a normal mood and affect. Her behavior is normal.    ED Course  Procedures (including critical care time)  Labs Reviewed  COMPREHENSIVE METABOLIC PANEL - Abnormal; Notable for the following:    Potassium 3.4 (*)    BUN 5 (*)    All other components within normal limits  CBC WITH DIFFERENTIAL  LIPASE, BLOOD   US Abdomen Complete  04/17/2013  *RADIOLOGY REPORT*  Clinical Data:  Nausea  ABDOMINAL ULTRASOUND COMPLETE  Comparison:  07/22/2010  Findings:  Gallbladder:  No gallstones, gallbladder wall thickening, or pericholecystic fluid.  Common Bile Duct:  Within normal limits in caliber.  Liver: No focal mass lesion identified.  Within normal limits in parenchymal echogenicity.  IVC:  Appears normal.  Pancreas:  No abnormality identified.  Spleen:  Within normal limits in size and echotexture.  Right kidney:  Normal in size and parenchymal echogenicity.  No evidence of mass or hydronephrosis.  Left kidney:  Normal in size and parenchymal echogenicity.  No evidence of mass or hydronephrosis.  Abdominal Aorta:  No aneurysm identified.  IMPRESSION: Negative abdominal ultrasound.   Original Report Authenticated By: Signa Kell, M.D.    Ct Abdomen Pelvis W Contrast  04/17/2013  *RADIOLOGY REPORT*  Clinical Data: Abdominal pain with nausea, vomiting, diarrhea. Hematemesis.  CT ABDOMEN AND PELVIS WITH CONTRAST  Technique:   Multidetector CT imaging of the abdomen and pelvis was performed following the standard protocol during bolus administration of intravenous contrast.  Contrast: 50mL OMNIPAQUE IOHEXOL 300 MG/ML  SOLN, OMNIPAQUE IOHEXOL 300 MG/ML  SOLN  Comparison: CT scan dated 01/03/2009  Findings: The liver, spleen, biliary tree, pancreas, adrenal glands, and kidneys are normal. There is slight mucosal thickening of the distal antrum of the stomach without evidence of ulceration. The stomach is not distended which could account for the mucosal prominence.  The large and small bowel are normal including the terminal ileum and appendix.  The uterus and left ovary are normal.  There is a 3.2 cm cyst on the right ovary.  This cyst is posterior to the lower uterine segment in the cul-de-sac and there is a small amount of fluid in the cul-de-sac, which is new.  This amount of fluid is not abnormal for a female of this age.  No osseous abnormality.  IMPRESSION:  1.  3.2 cm cyst on the right ovary in the cul-de-sac with a small amount of free fluid in the pelvic cul-de-sac. 2. Slightly prominent mucosa of the distal antrum of the stomach which could represent gastritis. 3.  Otherwise benign-appearing abdomen pelvis.   Original Report Authenticated By: Francene Boyers, M.D.      1. Gastritis   2. Intractable nausea and vomiting       MDM  Worsening nausea and vomiting since leaving ED yesterday. Feeling weak and tired. Giving IV fluids, zofran. Labs unremarkable. Marked tenderness in RUQ and mid-epigastric region. Obtaining abdominal US. 8:20 AM Patient still nauseated, however has not vomited. Complaining of pain. Will give toradol for pain. No narcotics at this time as she is being treated for substance abuse. 9:50 AM Abdominal US negative. Patient still reports nausea and abdominal pain. Will give GI cocktail as she has hx of stomach ulcers and PO challenge. 10:17 AM No change after GI cocktail. Small amount of  vomit present in emesis bag. Still nauseated. Tender to palpation in mid-epigastric area. Obtaining CT scan.  1:34 PM CT scan with results as stated above. After returning from CT scan, patient vomited and defecated on herself. She still very nauseous. Due to intractable nausea and vomiting, I will admit patient. 1:54 PM Admission accepted by Dr. Blake Divine, [facility] team 6. IV protonix and sucralafate given.  Trevor Mace, PA-C 04/17/13 1354

## 2013-04-17 NOTE — H&P (Signed)
[facility] History and Physical  [name]:[id number] DOB: [birth year] DOA: 04/17/2013   PCP: Ernst Breach, PA-C  Specialists: none  Chief Complaint: nausea, vomiting and an episode of loose bM since 2 days.   HPI: [name] is a 27 y.o. female with h/o alcohol abuse, at [facility] for [facility], recently admitted for similar complaints, and was discharged on 4/12 with protonix, comes back with nausea, vomiting and epigastric abdominal pain. Denies fever or chills. She reports her last alcohol intake was a week ago. And also reports that she has been at [facility] since then for rehabilitation. She reports she was given ibuprofen over the last one week for abdominal pain, which made the pain worse. Denies BRBPR. Or hematochezia on arrivalt o ED , she was given  Multiple doses of zofran without much relief and with persistent abd pain , requiring admission to medical service. GI consulted from [facility] .   Review of Systems: The patient denies anorexia, fever, weight loss,, vision loss, decreased hearing, hoarseness, chest pain, syncope, dyspnea on exertion, peripheral edema, balance deficits, hemoptysis,  melena, hematochezia,  hematuria, incontinence, genital sores, muscle weakness, suspicious skin lesions, transient blindness, difficulty walking, depression, unusual weight change, abnormal bleeding, enlarged lymph nodes, angioedema, and breast masses.    Past Medical History  Diagnosis Date  . Stomach ulcer   . Polysubstance abuse   . Adjustment disorder with emotional disturbance   . Metabolic acidosis 03/2013     hospitaliation due to alcohol abuse, dehyration, nausea/vomiting  . 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 Hematemesis 03/2013    along with alcoholic gastritis, hospitalization  . Pyelonephritis 03/2010    hospitalization   Past Surgical History  Procedure Laterality Date  . Breast lumpectomy      right  . Cesarean  section  2005   Social History:  reports that she has been smoking Cigarettes.  She has been smoking about 1.00 pack per day. She has never used smokeless tobacco. She reports that  drinks alcohol. She reports that she uses illicit drugs (Marijuana and Cocaine).  where does patient [facility]--[facility]. No Known Allergies  History reviewed. No pertinent family history. [facility] FAMILY HISTORY.  Prior to Admission medications   Medication Sig Start Date End Date Taking? Authorizing Provider  ibuprofen (ADVIL,MOTRIN) 600 MG tablet Take 600 mg by mouth every 6 (six) hours as needed for pain.   Yes Historical Provider, MD  Multiple Vitamin (MULITIVITAMIN WITH MINERALS) TABS Take 1 tablet by mouth daily.   Yes Historical Provider, MD  ondansetron (ZOFRAN) 8 MG tablet Take 8 mg by mouth every 8 (eight) hours as needed for nausea.   Yes Historical Provider, MD  promethazine (PHENERGAN) 25 MG tablet Take 1 tablet (25 mg total) by mouth every 6 (six) hours as needed for nausea. 04/11/13  Yes Donnetta Hutching, MD   Physical Exam: Filed Vitals:   04/17/13 1030 04/17/13 1100 04/17/13 1130 04/17/13 1200  BP: 133/80 134/71 137/74 151/75  Pulse: 50 51 50 49  Temp:      TempSrc:      Resp:      SpO2: 100% 100% 100% 100%    Constitutional: Vital signs reviewed.  Patient is a well-developed and well-nourished  in no acute distress and cooperative with exam. Alert and oriented x3.  Head: Normocephalic and atraumatic Mouth: no erythema or exudates, MMM Eyes: PERRL, EOMI, conjunctivae normal, No scleral icterus.  Neck: Supple, Trachea midline normal ROM,  No JVD, mass, thyromegaly, or carotid bruit present.  Cardiovascular: RRR, S1 normal, S2 normal, no MRG, pulses symmetric and intact bilaterally Pulmonary/Chest: CTAB, no wheezes, rales, or rhonchi Abdominal: Soft. TENDER generalized, more int he epigastric area.  non-distended, bowel sounds are normal, no masses, organomegaly, or guarding present.  Musculoskeletal:  No joint deformities, erythema, or stiffness, ROM full and no nontender Neurological: A&O x3, Strength is normal and symmetric bilaterally, cranial nerve II-XII are grossly intact, no focal motor deficit, sensory intact to light touch bilaterally.  Skin: Warm, dry and intact. No rash, cyanosis, or clubbing.  Psychiatric: Normal mood and affect. speech and behavior is normal   Labs on Admission:  Basic Metabolic Panel:  Recent Labs Lab 04/11/13 1500 04/15/13 2104 04/17/13 0635  NA 135 137 137  K 3.5 3.3* 3.4*  CL 96 101 102  CO2 26 23 23   GLUCOSE 184* 89 82  BUN 6 5* 5*  CREATININE 0.70 0.68 0.57  CALCIUM 9.6 9.7 9.3   Liver Function Tests:  Recent Labs Lab 04/11/13 1500 04/17/13 0635  AST 27 22  ALT 20 17  ALKPHOS 79 66  BILITOT 0.6 0.3  PROT 7.5 7.4  ALBUMIN 4.0 4.1    Recent Labs Lab 04/17/13 0635  LIPASE 15   No results found for this basename: AMMONIA,  in the last 168 hours CBC:  Recent Labs Lab 04/11/13 1500 04/15/13 2104 04/17/13 0635  WBC 4.8 10.4 7.1  NEUTROABS  --  7.7 4.9  HGB 13.4 14.0 12.9  HCT 38.2 39.3 37.3  MCV 94.3 92.9 93.5  PLT 308 302 303   Cardiac Enzymes: No results found for this basename: CKTOTAL, CKMB, CKMBINDEX, TROPONINI,  in the last 168 hours  BNP (last 3 results) No results found for this basename: PROBNP,  in the last 8760 hours CBG: No results found for this basename: GLUCAP,  in the last 168 hours  Radiological Exams on Admission: US Abdomen Complete  04/17/2013  *RADIOLOGY REPORT*  Clinical Data:  Nausea  ABDOMINAL ULTRASOUND COMPLETE  Comparison:  07/22/2010  Findings:  Gallbladder:  No gallstones, gallbladder wall thickening, or pericholecystic fluid.  Common Bile Duct:  Within normal limits in caliber.  Liver: No focal mass lesion identified.  Within normal limits in parenchymal echogenicity.  IVC:  Appears normal.  Pancreas:  No abnormality identified.  Spleen:  Within normal limits in size and echotexture.   Right kidney:  Normal in size and parenchymal echogenicity.  No evidence of mass or hydronephrosis.  Left kidney:  Normal in size and parenchymal echogenicity.  No evidence of mass or hydronephrosis.  Abdominal Aorta:  No aneurysm identified.  IMPRESSION: Negative abdominal ultrasound.   Original Report Authenticated By: Signa Kell, M.D.    Ct Abdomen Pelvis W Contrast  04/17/2013  *RADIOLOGY REPORT*  Clinical Data: Abdominal pain with nausea, vomiting, diarrhea. Hematemesis.  CT ABDOMEN AND PELVIS WITH CONTRAST  Technique:  Multidetector CT imaging of the abdomen and pelvis was performed following the standard protocol during bolus administration of intravenous contrast.  Contrast: 50mL OMNIPAQUE IOHEXOL 300 MG/ML  SOLN, OMNIPAQUE IOHEXOL 300 MG/ML  SOLN  Comparison: CT scan dated 01/03/2009  Findings: The liver, spleen, biliary tree, pancreas, adrenal glands, and kidneys are normal. There is slight mucosal thickening of the distal antrum of the stomach without evidence of ulceration. The stomach is not distended which could account for the mucosal prominence.  The large and small bowel are normal including the terminal ileum and appendix.  The uterus and left ovary are normal.  There is a 3.2 cm cyst on the right ovary.  This cyst is posterior to the lower uterine segment in the cul-de-sac and there is a small amount of fluid in the cul-de-sac, which is new.  This amount of fluid is not abnormal for a female of this age.  No osseous abnormality.  IMPRESSION:  1.  3.2 cm cyst on the right ovary in the cul-de-sac with a small amount of free fluid in the pelvic cul-de-sac. 2. Slightly prominent mucosa of the distal antrum of the stomach which could represent gastritis. 3.  Otherwise benign-appearing abdomen pelvis.   Original Report Authenticated By: Francene Boyers, M.D.       Assessment/Plan Active Problems:  1. Nausea/ vomiting and epigastric pain:  - admit to medsurg. - possibly from alcoholic  gastritis, worsened from recent ibuprofen use.   - IV fluids/ IV Protonix/ carafate as needed/ IV ANTI emetics.  - GI consulted.    2 hypokalemia: will be repleted as needed.   3. DVT prophylaxis.   Code Status: full code ( presumed)  Family Communication: family at bedside Disposition Plan:  1 to 2 days.   Time spent: 80 minutes.   [facility] Pager [phone]  If 7PM-7AM, please contact night-coverage [email or web address] Password [facility] 04/17/2013, 2:39 PM

## 2013-04-17 NOTE — ED Notes (Signed)
ZOX:[id number]<AV> Expected date:04/17/13<BR> Expected time: 5:16 AM<BR> Means of arrival:Ambulance<BR> Comments:<BR> N/v; weakness

## 2013-04-18 DIAGNOSIS — R1013 Epigastric pain: Secondary | ICD-10-CM

## 2013-04-18 DIAGNOSIS — E860 Dehydration: Secondary | ICD-10-CM

## 2013-04-18 LAB — BASIC METABOLIC PANEL
CO2: 22 mEq/L (ref 19–32)
Chloride: 101 mEq/L (ref 96–112)
GFR calc non Af Amer: >90 mL/min (ref >90)
Glucose, Bld: 65 mg/dL — ABNORMAL LOW (ref 70–99)
Potassium: 3.5 mEq/L (ref 3.5–5.1)
Sodium: 134 mEq/L — ABNORMAL LOW (ref 135–145)

## 2013-04-18 LAB — CBC
HCT: 33.1 % — ABNORMAL LOW (ref 36.0–46.0)
Hemoglobin: 11.7 g/dL — ABNORMAL LOW (ref 12.0–15.0)
MCH: 33.1 pg (ref 26.0–34.0)
MCV: 93.8 fL (ref 78.0–100.0)
RBC: 3.53 MIL/uL — ABNORMAL LOW (ref 3.87–5.11)

## 2013-04-18 LAB — URINALYSIS, MICROSCOPIC ONLY
Glucose, UA: NEGATIVE mg/dL
Hgb urine dipstick: NEGATIVE
Protein, ur: NEGATIVE mg/dL
Specific Gravity, Urine: 1.004 — ABNORMAL LOW (ref 1.005–1.030)

## 2013-04-18 LAB — IRON AND TIBC: Saturation Ratios: 10 % — ABNORMAL LOW (ref 20–55)

## 2013-04-18 MED ORDER — THIAMINE HCL 100 MG/ML IJ SOLN
100.0000 mg | Freq: Every day | INTRAMUSCULAR | Status: DC
  Administered 2013-04-18 – 2013-04-21 (×2): 100 mg via INTRAVENOUS
  Filled 2013-04-18 (×5): qty 1

## 2013-04-18 MED ORDER — THIAMINE HCL 100 MG/ML IJ SOLN: 100.0000 mg | Freq: Every day | INTRAMUSCULAR | Status: DC

## 2013-04-18 MED ORDER — ADULT MULTIVITAMIN W/MINERALS CH: 1.0000 | ORAL_TABLET | Freq: Every day | ORAL | Status: DC

## 2013-04-18 MED ORDER — SODIUM CHLORIDE 0.9 % IV SOLN
INTRAVENOUS | Status: DC
  Administered 2013-04-18 – 2013-04-20 (×4): via INTRAVENOUS
  Filled 2013-04-18 (×7): qty 1000

## 2013-04-18 MED ORDER — ONDANSETRON HCL 4 MG/2ML IJ SOLN
4.0000 mg | INTRAMUSCULAR | Status: DC
  Administered 2013-04-18 – 2013-04-22 (×24): 4 mg via INTRAVENOUS
  Filled 2013-04-18 (×24): qty 2

## 2013-04-18 MED ORDER — PANTOPRAZOLE SODIUM 40 MG IV SOLR
40.0000 mg | Freq: Two times a day (BID) | INTRAVENOUS | Status: DC
  Administered 2013-04-18 – 2013-04-22 (×9): 40 mg via INTRAVENOUS
  Filled 2013-04-18 (×10): qty 40

## 2013-04-18 MED ORDER — VITAMIN B-1 100 MG PO TABS
100.0000 mg | ORAL_TABLET | Freq: Every day | ORAL | Status: DC
  Administered 2013-04-20 – 2013-04-22 (×2): 100 mg via ORAL
  Filled 2013-04-18 (×5): qty 1

## 2013-04-18 MED ORDER — LORAZEPAM 1 MG PO TABS
1.0000 mg | ORAL_TABLET | Freq: Four times a day (QID) | ORAL | Status: AC | PRN
  Administered 2013-04-19: 1 mg via ORAL
  Filled 2013-04-18: qty 1

## 2013-04-18 MED ORDER — LORAZEPAM 2 MG/ML IJ SOLN
1.0000 mg | Freq: Four times a day (QID) | INTRAMUSCULAR | Status: AC | PRN
  Administered 2013-04-19: 1 mg via INTRAVENOUS
  Filled 2013-04-18: qty 1

## 2013-04-18 MED ORDER — ADULT MULTIVITAMIN W/MINERALS CH
1.0000 | ORAL_TABLET | Freq: Every day | ORAL | Status: DC
  Administered 2013-04-18 – 2013-04-22 (×4): 1 via ORAL
  Filled 2013-04-18 (×5): qty 1

## 2013-04-18 MED ORDER — LOPERAMIDE HCL 2 MG PO CAPS
2.0000 mg | ORAL_CAPSULE | Freq: Four times a day (QID) | ORAL | Status: DC | PRN
  Administered 2013-04-18: 2 mg via ORAL
  Filled 2013-04-18: qty 1

## 2013-04-18 MED ORDER — FOLIC ACID 1 MG PO TABS
1.0000 mg | ORAL_TABLET | Freq: Every day | ORAL | Status: DC
  Administered 2013-04-18 – 2013-04-22 (×4): 1 mg via ORAL
  Filled 2013-04-18 (×5): qty 1

## 2013-04-18 NOTE — Progress Notes (Signed)
Patient screened for Link to Wellness program as a benefit of being a dependent of [employer] employee ( patient's mom) with [employer]. Explained Link to [employer] and benefits. However, she currently does not have any Link to Wellness needs. Discussed at bedside on whether it would be possible for [facility] to give patient the medications she needs after hospital discharge.  Raiford Noble, MSN-Ed, RN,BSN, [facility], [phone]

## 2013-04-18 NOTE — Progress Notes (Signed)
[facility] Progress Note  SUBJECTIVE: still having nausea. Had recurrent epigastric pain after eating breakfast. Loose stools also persist  OBJECTIVE:  Vital signs in last 24 hours: Temp:  [98.9 F (37.2 C)-99.3 F (37.4 C)] 98.9 F (37.2 C) (04/23 0555) Pulse Rate:  [49-78] 55 (04/23 0555) Resp:  [16] 16 (04/23 0555) BP: (121-151)/(57-80) 122/73 mmHg (04/23 0555) SpO2:  [100 %] 100 % (04/23 0555) Weight:  [115 lb (52.164 kg)] 115 lb (52.164 kg) (04/22 1604) Last BM Date: 04/17/13 General:    black female in NAD Heart:  Regular rate and rhythm Abdomen:  Soft, nontender and nondistended. Normal bowel sounds. Neurologic:  Alert and oriented,  grossly normal neurologically. Psych:  Cooperative. Normal mood and affect.  Lab Results:  Recent Labs  04/15/13 2104 04/17/13 0635 04/18/13 0405  WBC 10.4 7.1 6.8  HGB 14.0 12.9 11.7*  HCT 39.3 37.3 33.1*  PLT 302 303 267   BMET  Recent Labs  04/15/13 2104 04/17/13 0635 04/18/13 0405  NA 137 137 134*  K 3.3* 3.4* 3.5  CL 101 102 101  CO2 23 23 22   GLUCOSE 89 82 65*  BUN 5* 5* 3*  CREATININE 0.68 0.57 0.57  CALCIUM 9.7 9.3 8.6   LFT  Recent Labs  04/17/13 0635  PROT 7.4  ALBUMIN 4.1  AST 22  ALT 17  ALKPHOS 66  BILITOT 0.3    Studies/Results: US Abdomen Complete  04/17/2013  *RADIOLOGY REPORT*  Clinical Data:  Nausea  ABDOMINAL ULTRASOUND COMPLETE  Comparison:  07/22/2010  Findings:  Gallbladder:  No gallstones, gallbladder wall thickening, or pericholecystic fluid.  Common Bile Duct:  Within normal limits in caliber.  Liver: No focal mass lesion identified.  Within normal limits in parenchymal echogenicity.  IVC:  Appears normal.  Pancreas:  No abnormality identified.  Spleen:  Within normal limits in size and echotexture.  Right kidney:  Normal in size and parenchymal echogenicity.  No evidence of mass or hydronephrosis.  Left kidney:  Normal in size and parenchymal echogenicity.  No evidence of  mass or hydronephrosis.  Abdominal Aorta:  No aneurysm identified.  IMPRESSION: Negative abdominal ultrasound.   Original Report Authenticated By: Signa Kell, M.D.    Ct Abdomen Pelvis W Contrast  04/17/2013  *RADIOLOGY REPORT*  Clinical Data: Abdominal pain with nausea, vomiting, diarrhea. Hematemesis.  CT ABDOMEN AND PELVIS WITH CONTRAST  Technique:  Multidetector CT imaging of the abdomen and pelvis was performed following the standard protocol during bolus administration of intravenous contrast.  Contrast: 50mL OMNIPAQUE IOHEXOL 300 MG/ML  SOLN, OMNIPAQUE IOHEXOL 300 MG/ML  SOLN  Comparison: CT scan dated 01/03/2009  Findings: The liver, spleen, biliary tree, pancreas, adrenal glands, and kidneys are normal. There is slight mucosal thickening of the distal antrum of the stomach without evidence of ulceration. The stomach is not distended which could account for the mucosal prominence.  The large and small bowel are normal including the terminal ileum and appendix.  The uterus and left ovary are normal.  There is a 3.2 cm cyst on the right ovary.  This cyst is posterior to the lower uterine segment in the cul-de-sac and there is a small amount of fluid in the cul-de-sac, which is new.  This amount of fluid is not abnormal for a female of this age.  No osseous abnormality.  IMPRESSION:  1.  3.2 cm cyst on the right ovary in the cul-de-sac with a small amount of free fluid in the pelvic cul-de-sac. 2.  Slightly prominent mucosa of the distal antrum of the stomach which could represent gastritis. 3.  Otherwise benign-appearing abdomen pelvis.   Original Report Authenticated By: Francene Boyers, M.D.      ASSESSMENT / PLAN:  1. Nausea, vomiting, upper abdominal pain. CTscan reveals only slightly prominence of distal antral mucosa. Abdominal ultrasound is normal. She may have gastritis.  LFTs, lipase and WBC normal. Continue BID PPI, change to PO when n/v resolves. Continue Carafate and antiemetics. For  now, no EGD is planned  2.  Chronic ETOH and polysubstance abuse    LOS: 1 day   Willette Cluster  04/18/2013, 10:29 AM  I have personally taken an interval history, reviewed the chart, and examined the patient.  I agree with the extender's note, impression and recommendations. Check fecal elastace to rule out pancreatic insufficiency.  Barbette Hair. Arlyce Dice, MD, [facility] [phone]

## 2013-04-18 NOTE — Progress Notes (Signed)
[facility] PROGRESS NOTE  [name] EAV:[id number] DOB: 06-Apr-1986 DOA: 04/17/2013 PCP: Ernst Breach, PA-C  Interim History 27 y.o. female. She is an alcoholic since age 38 and substance abuser. Told several years ago she probably had a stomach ulcer. Admitted 4/9 - 04/07/13 to hospital for nausea, vomiting and abdominal pain. 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 On 04/04/2013, she was positive for cocaine, cannabinoids and alcohol.  Discharged on once daily Protonix and prn Zofran.  LFTs normal at d/c on 04/07/13. Was not tolerating POs well at discharge 04/07/13.  Did not take GI meds after discharge.  Seen in GI office on 4/16 and rxd with Phenergen for ongoing vomiting and abdominal pain.  Admitted to [facility] (alcohol rehab) 4/17. While there did not receive any GI meds or antiemetics. They did prescribe up to 1800 mg of Ibuprofen daily for ongoing generalized upper/mid abdominal pain.  Sent to ED 04/15/13 with persistent emesis and diarrhea. Labs, including urine pregnancy test, were negative. ED provided Rx for Reglan prn and suggested using OTC Loperamide.  Sent back 04/17/13 to ED with same-->diffuse abdominal pain, nausea, emesis (non bloody). Non melenic loose stools: Last BM was 4/21.  LFTs still normal and Ct abdomen suggestive of gastritis. No liver or biliary disease on ultrasound or the CT.  No substance or ETOH use since 4/16. Previously drinking up to 12 pack malt liquor and/or 750 ml spirits daily.  Alcohol level neg on 04/11/13. Tested negative for HIV and viral hepatitis in past, but no records of this in Epic   Assessment/Plan: Intractable nausea, vomiting -Likely due to gastritis from alcohol and NSAIDs -Continue antiemetics -Continue IV fluids -Change antiemetics to every 4 hours around-the-clock scheduled -Appreciate GI input--no plans for EGD, fecal pancreatic elastase -LFTs and lipase normal -Avoid  NSAIDs -Continue PPI and Carafate  -clear liquids for now Hypokalemia  -Repleted  -Check magnesium  -check UA -Urine pregnancy test is negative 04/15/2013 Normocytic anemia  -Partly dilutional  -Check iron studies  -Check B12 and RBC folate given alcohol history  Diarrhea  -C. difficile PCR is negative  -Likely due to oral contrast from CT -Denied having diarrhea prior to contrast Alcohol abuse -Last drink was 04/11/2013 -Less likelihood of developing withdrawal but place on CIWA as precaution -Start patient on thiamine IV   Family Communication:   No family at beside Disposition Plan:   Home when medically stable         Procedures/Studies: Dg Abd 1 View  04/04/2013  *RADIOLOGY REPORT*  Clinical Data: Abdominal pain.  ABDOMEN - 1 VIEW  Comparison: CT abdomen and pelvis 01/03/2009.  Abdominal ultrasound 07/02/2010.  Findings: Bowel gas pattern is unremarkable.  There is no evidence for obstruction or free air.  The lung bases are clear.  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  IMPRESSION: No acute abnormality.   Original Report Authenticated By: Marin Roberts, M.D.    US Abdomen Complete  04/17/2013  *RADIOLOGY REPORT*  Clinical Data:  Nausea  ABDOMINAL ULTRASOUND COMPLETE  Comparison:  07/22/2010  Findings:  Gallbladder:  No gallstones, gallbladder wall thickening, or pericholecystic fluid.  Common Bile Duct:  Within normal limits in caliber.  Liver: No focal mass lesion identified.  Within normal limits in parenchymal echogenicity.  IVC:  Appears normal.  Pancreas:  No abnormality identified.  Spleen:  Within normal limits in size and echotexture.  Right kidney:  Normal in size and parenchymal echogenicity.  No evidence of mass or hydronephrosis.  Left kidney:  Normal in size and parenchymal echogenicity.  No evidence of mass or hydronephrosis.  Abdominal Aorta:  No aneurysm identified.  IMPRESSION: Negative abdominal ultrasound.   Original Report  Authenticated By: Signa Kell, M.D.    Ct Abdomen Pelvis W Contrast  04/17/2013  *RADIOLOGY REPORT*  Clinical Data: Abdominal pain with nausea, vomiting, diarrhea. Hematemesis.  CT ABDOMEN AND PELVIS WITH CONTRAST  Technique:  Multidetector CT imaging of the abdomen and pelvis was performed following the standard protocol during bolus administration of intravenous contrast.  Contrast: 50mL OMNIPAQUE IOHEXOL 300 MG/ML  SOLN, OMNIPAQUE IOHEXOL 300 MG/ML  SOLN  Comparison: CT scan dated 01/03/2009  Findings: The liver, spleen, biliary tree, pancreas, adrenal glands, and kidneys are normal. There is slight mucosal thickening of the distal antrum of the stomach without evidence of ulceration. The stomach is not distended which could account for the mucosal prominence.  The large and small bowel are normal including the terminal ileum and appendix.  The uterus and left ovary are normal.  There is a 3.2 cm cyst on the right ovary.  This cyst is posterior to the lower uterine segment in the cul-de-sac and there is a small amount of fluid in the cul-de-sac, which is new.  This amount of fluid is not abnormal for a female of this age.  No osseous abnormality.  IMPRESSION:  1.  3.2 cm cyst on the right ovary in the cul-de-sac with a small amount of free fluid in the pelvic cul-de-sac. 2. Slightly prominent mucosa of the distal antrum of the stomach which could represent gastritis. 3.  Otherwise benign-appearing abdomen pelvis.   Original Report Authenticated By: Francene Boyers, M.D.          Subjective: Patient abdominal pain is 25% better. No vomiting today but has had nausea. Able to tolerate liquids. Does not want any solid food at this time. Denies fevers, chills, chest pain, shortness breath, rashes, dysuria. No hematochezia or melena. Abdominal pain is epigastric.  Objective: Filed Vitals:   04/17/13 1604 04/17/13 2138 04/18/13 0555 04/18/13 1405  BP: 125/64 121/57 122/73 116/73  Pulse: 78 56 55  60  Temp: 99.3 F (37.4 C) 99.3 F (37.4 C) 98.9 F (37.2 C) 99.6 F (37.6 C)  TempSrc: Oral Oral Oral Oral  Resp:  16 16 16   Height: 5\' 2"  (1.575 m)     Weight: 52.164 kg (115 lb)     SpO2: 100% 100% 100% 100%    Intake/Output Summary (Last 24 hours) at 04/18/13 1643 Last data filed at 04/18/13 0900  Gross per 24 hour  Intake    120 ml  Output      0 ml  Net    120 ml   Weight change:  Exam:   General:  Pt is alert, follows commands appropriately, not in acute distress  HEENT: No icterus, No thrush,  [place]/AT  Cardiovascular: RRR, S1/S2, no rubs, no gallops  Respiratory: CTA bilaterally, no wheezing, no crackles, no rhonchi  Abdomen: Soft/+BS, epigastric tenderness without any peritoneal signs, non distended, mild guarding epigastric area   Extremities: No edema, No lymphangitis, No petechiae, No rashes, no synovitis  Data Reviewed: Basic Metabolic Panel:  Recent Labs Lab 04/15/13 2104 04/17/13 0635 04/18/13 0405  NA 137 137 134*  K 3.3* 3.4* 3.5  CL 101 102 101  CO2 23 23 22   GLUCOSE 89 82 65*  BUN 5* 5* 3*  CREATININE 0.68 0.57 0.57  CALCIUM 9.7 9.3 8.6   Liver Function Tests:  Recent Labs Lab 04/17/13 0635  AST 22  ALT 17  ALKPHOS 66  BILITOT 0.3  PROT 7.4  ALBUMIN 4.1    Recent Labs Lab 04/17/13 0635  LIPASE 15   No results found for this basename: AMMONIA,  in the last 168 hours CBC:  Recent Labs Lab 04/15/13 2104 04/17/13 0635 04/18/13 0405  WBC 10.4 7.1 6.8  NEUTROABS 7.7 4.9  --   HGB 14.0 12.9 11.7*  HCT 39.3 37.3 33.1*  MCV 92.9 93.5 93.8  PLT 302 303 267   Cardiac Enzymes: No results found for this basename: CKTOTAL, CKMB, CKMBINDEX, TROPONINI,  in the last 168 hours BNP: No components found with this basename: POCBNP,  CBG: No results found for this basename: GLUCAP,  in the last 168 hours  Recent Results (from the past 240 hour(s))  CLOSTRIDIUM DIFFICILE BY PCR     Status: None   Collection Time    04/17/13   9:27 PM      Result Value Range Status   C difficile by pcr NEGATIVE  NEGATIVE Final     Scheduled Meds: . enoxaparin (LOVENOX) injection  40 mg Subcutaneous Q24H  . multivitamin with minerals  1 tablet Oral Daily  . ondansetron (ZOFRAN) IV  4 mg Intravenous Q4H  . pantoprazole (PROTONIX) IV  40 mg Intravenous Q12H  . sucralfate  1 g Oral TID WC & HS   Continuous Infusions: . sodium chloride 0.9 % 1,000 mL with potassium chloride 20 mEq infusion 100 mL/hr at 04/18/13 0820     Teryn Boerema, DO  [facility] Pager [phone]  If 7PM-7AM, please contact night-coverage [email or web address] Password [facility] 04/18/2013, 4:43 PM   LOS: 1 day

## 2013-04-18 NOTE — Progress Notes (Signed)
INITIAL NUTRITION ASSESSMENT  DOCUMENTATION CODES Per approved criteria  -Not Applicable   INTERVENTION: - Diet advancement per MD - Multivitamin 1 tablet PO daily - Will continue to monitor   NUTRITION DIAGNOSIS: Altered GI function related to nausea, diarrhea as evidenced by pt statement.   Goal: 1. Resolution of nausea/diarrhea 2. Advance diet as tolerated to bland diet  Monitor:  Weights, labs, nausea, diarrhea, diet advancement  Reason for Assessment: Nutrition risk  27 y.o. female  Admitting Dx: Nausea, vomiting, loose stool  ASSESSMENT: Pt with h/o alcohol abuse recently admitted with nausea, vomiting, and epigastric pain. Met with pt who reports she has not been able to eat anything for 2 days. Pt reports having diarrhea that started yesterday and reports 4-5 episodes today. She reports her nausea is still bad but denies any vomiting. Pt reports having similar complaints over a week and a half ago. Pt states before then she was eating 2 meals/day. Pt denies any weight loss. Pt reports being able to eat some jello and broth this morning however had some diarrhea and nausea afterwards.   Height: Ht Readings from Last 1 Encounters:  04/17/13 5\' 2"  (1.575 m)    Weight: Wt Readings from Last 1 Encounters:  04/17/13 115 lb (52.164 kg)    Ideal Body Weight: 110 lb  % Ideal Body Weight: 105  Wt Readings from Last 10 Encounters:  04/17/13 115 lb (52.164 kg)  04/11/13 114 lb (51.71 kg)  04/04/13 115 lb 11.9 oz (52.5 kg)  11/05/11 118 lb (53.524 kg)    Usual Body Weight: 115 lb  % Usual Body Weight: 100  BMI:  Body mass index is 21.03 kg/(m^2).  Estimated Nutritional Needs: Kcal: 1300-1550 Protein: 55-65g Fluid: 1.3-1.5L/day  Skin: Intact   Diet Order: Clear Liquid  EDUCATION NEEDS: -No education needs identified at this time   Intake/Output Summary (Last 24 hours) at 04/18/13 1347 Last data filed at 04/18/13 0900  Gross per 24 hour  Intake    120  ml  Output      0 ml  Net    120 ml    Last BM: 4/22  Labs:   Recent Labs Lab 04/15/13 2104 04/17/13 0635 04/18/13 0405  NA 137 137 134*  K 3.3* 3.4* 3.5  CL 101 102 101  CO2 23 23 22   BUN 5* 5* 3*  CREATININE 0.68 0.57 0.57  CALCIUM 9.7 9.3 8.6  GLUCOSE 89 82 65*    CBG (last 3)  No results found for this basename: GLUCAP,  in the last 72 hours  Scheduled Meds: . enoxaparin (LOVENOX) injection  40 mg Subcutaneous Q24H  . ondansetron (ZOFRAN) IV  4 mg Intravenous Q4H  . pantoprazole (PROTONIX) IV  40 mg Intravenous Q12H  . sucralfate  1 g Oral TID WC & HS    Continuous Infusions: . sodium chloride 0.9 % 1,000 mL with potassium chloride 20 mEq infusion 100 mL/hr at 04/18/13 0820    Past Medical History  Diagnosis Date  . Stomach ulcer     never had any tests to confirm this diagni=oses  . Polysubstance abuse   . Adjustment disorder with emotional disturbance   . Metabolic acidosis 03/2013     hospitaliation due to alcohol abuse, dehyration, nausea/vomiting  . 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 Hematemesis 03/2013    along with alcoholic gastritis, hospitalization  . Pyelonephritis 03/2010    hospitalization    Past Surgical History  Procedure Laterality  Date  . Breast lumpectomy Right     benign pathology  . Cesarean section  [address] [place] [address], [place] [phone] Pager [phone] After Hours Pager

## 2013-04-18 NOTE — ED Provider Notes (Signed)
Medical screening examination/treatment/procedure(s) were performed by non-physician practitioner and as supervising physician I was immediately available for consultation/collaboration.  Sunnie Nielsen, MD 04/18/13 [phone]

## 2013-04-19 DIAGNOSIS — D649 Anemia, unspecified: Secondary | ICD-10-CM | POA: Diagnosis present

## 2013-04-19 LAB — HIV ANTIBODY (ROUTINE TESTING W REFLEX): HIV: NONREACTIVE

## 2013-04-19 LAB — CBC
Hemoglobin: 11.2 g/dL — ABNORMAL LOW (ref 12.0–15.0)
MCHC: 35.3 g/dL (ref 30.0–36.0)
RBC: 3.4 MIL/uL — ABNORMAL LOW (ref 3.87–5.11)
WBC: 4 10*3/uL (ref 4.0–10.5)

## 2013-04-19 LAB — BASIC METABOLIC PANEL
BUN: 3 mg/dL — ABNORMAL LOW (ref 6–23)
CO2: 26 mEq/L (ref 19–32)
Calcium: 8.5 mg/dL (ref 8.4–10.5)
Chloride: 105 mEq/L (ref 96–112)
Creatinine, Ser: 0.66 mg/dL (ref 0.50–1.10)
GFR calc Af Amer: >90 mL/min (ref >90)
GFR calc non Af Amer: >90 mL/min (ref >90)
Glucose, Bld: 76 mg/dL (ref 70–99)
Potassium: 3.6 mEq/L (ref 3.5–5.1)
Sodium: 138 mEq/L (ref 135–145)

## 2013-04-19 LAB — VITAMIN B12: Vitamin B-12: 924 pg/mL — ABNORMAL HIGH (ref 211–911)

## 2013-04-19 LAB — MAGNESIUM: Magnesium: 1.7 mg/dL (ref 1.5–2.5)

## 2013-04-19 MED ORDER — PROMETHAZINE HCL 25 MG/ML IJ SOLN
12.5000 mg | Freq: Four times a day (QID) | INTRAMUSCULAR | Status: DC | PRN
  Administered 2013-04-19: 12.5 mg via INTRAVENOUS
  Administered 2013-04-19 – 2013-04-21 (×4): 25 mg via INTRAVENOUS
  Filled 2013-04-19 (×5): qty 1

## 2013-04-19 NOTE — Care Management Note (Addendum)
CARE MANAGEMENT NOTE 04/19/2013  Patient:  [name], [name]   Account Number:  [id number]  Date Initiated:  04/19/2013  Documentation initiated by:  Abdifatah Colquhoun  Subjective/Objective Assessment:   27 yo female admitted with gastritis. PTA pt admitted from [facility].     Action/Plan:   [facility]   Anticipated DC Date:     Anticipated DC Plan:  IP REHAB FACILITY  In-house referral  Clinical Social Worker      DC Associate Professor  CM consult      Choice offered to / List presented to:             Status of service:  Completed, signed off Medicare Important Message given?   (If response is "NO", the following Medicare IM given date fields will be blank) Date Medicare IM given:   Date Additional Medicare IM given:    Discharge Disposition:    Per UR Regulation:  Reviewed for med. necessity/level of care/duration of stay  If discussed at Long Length of Stay Meetings, dates discussed:    Comments:  04/19/13 1500 Yoanna Jurczyk,RN,BSN [phone]  Cm spoke with patient at bedside confirming dc plan includes discharge back to [facility]  Upon [email or web address] spoke with Charge RN Wellington Hampshire at [facility] concerning pt's recent admission at facility. Pt brought to [facility] from facility. Pt spoke with [facility] CM concerning not receiving medications such as protonix during recent stay. Per Consulting civil engineer pt will receive medication if placed on pt's discharge summary upon discharge. CM to follow up with CSW dc plan includes pt discharging back to [facility] upon discharge.

## 2013-04-19 NOTE — Progress Notes (Signed)
Patient ID: [name], female   DOB: 1986-05-20, 27 y.o.   MRN: [id number] [facility] Progress Note  Subjective: Still vomiting, not keeping anything down-still c/o epigastric pain, and some diarrhea  Objective:  Vital signs in last 24 hours: Temp:  [98.5 F (36.9 C)-99.6 F (37.6 C)] 98.5 F (36.9 C) (04/24 0510) Pulse Rate:  [53-60] 53 (04/24 0510) Resp:  [16] 16 (04/24 0510) BP: (100-130)/(53-73) 100/53 mmHg (04/24 0510) SpO2:  [100 %] 100 % (04/24 0510) Last BM Date: 04/19/13 General:   Alert,  Well-developed, young AA female   in NAD Heart:  Regular rate and rhythm; no murmurs Pulm;clear Abdomen:  Soft, nontender and nondistended. Normal bowel sounds, without guarding, and without rebound.   Extremities:  Without edema. Neurologic:  Alert and  oriented x4;  grossly normal neurologically. Psych:  Alert and cooperative. Normal mood and affect.  Intake/Output from previous day: 04/23 0701 - 04/24 0700 In: 240 [P.O.:240] Out: 500 [Urine:500] Intake/Output this shift:    Lab Results:  Recent Labs  04/17/13 0635 04/18/13 0405 04/19/13 0458  WBC 7.1 6.8 4.0  HGB 12.9 11.7* 11.2*  HCT 37.3 33.1* 31.7*  PLT 303 267 266   BMET  Recent Labs  04/17/13 0635 04/18/13 0405 04/19/13 0458  NA 137 134* 138  K 3.4* 3.5 3.6  CL 102 101 105  CO2 23 22 26   GLUCOSE 82 65* 76  BUN 5* 3* 3*  CREATININE 0.57 0.57 0.66  CALCIUM 9.3 8.6 8.5   LFT  Recent Labs  04/17/13 0635  PROT 7.4  ALBUMIN 4.1  AST 22  ALT 17  ALKPHOS 66  BILITOT 0.3    Assessment / Plan: #1 27 yo [facility] c/o abdominal pain,nausea/vomiting and also developed diarrhea since admit. She has hx of gastritis , and had been receiving Nsaids in rehab No evidence for hepatitis or pancreatitis- suspect ETOH /NSaid induced gastropathy vs a viral gastroenteritis.since also c/o diarrhea. Supportive management is appropriate Continue BID PPI, will  alternate antiemetics Continue clears Carafate suspension if she can keep it down Active Problems:   Abdominal pain, epigastric     LOS: 2 days   Amy Esterwood  04/19/2013, 11:13 AM  I have personally taken an interval history, reviewed the chart, and examined the patient.  I agree with the extender's note, impression and recommendations.  Barbette Hair. Arlyce Dice, MD, [facility] [phone]

## 2013-04-19 NOTE — Progress Notes (Signed)
[facility] PROGRESS NOTE  [name] MWU:[id number] DOB: 28-Jan-1986 DOA: 04/17/2013 PCP: Ernst Breach, PA-C  Interim History 27 y.o. female. She is an alcoholic since age 68 and substance abuser. Told several years ago she probably had a stomach ulcer. Admitted 4/9 - 04/07/13 to hospital for nausea, vomiting and abdominal pain. 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 On 04/04/2013, she was positive for cocaine, cannabinoids and alcohol.  Discharged on once daily Protonix and prn Zofran.  LFTs normal at d/c on 04/07/13. Was not tolerating POs well at discharge 04/07/13.  Did not take GI meds after discharge.  Seen in GI office on 4/16 and rxd with Phenergen for ongoing vomiting and abdominal pain.  Admitted to [facility] 4/17. While there did not receive any GI meds or antiemetics. They did prescribe up to 1800 mg of Ibuprofen daily for ongoing generalized upper/mid abdominal pain.  Sent to ED 04/15/13 with persistent emesis and diarrhea. Labs, including urine pregnancy test, were negative. ED provided Rx for Reglan prn and suggested using OTC Loperamide.  Sent back 04/17/13 to ED with same-->diffuse abdominal pain, nausea, emesis (non bloody). Non melenic loose stools: Last BM was 4/21.  LFTs still normal and Ct abdomen suggestive of gastritis. No liver or biliary disease on ultrasound or the CT.  No substance or ETOH use since 4/16. Previously drinking up to 12 pack malt liquor and/or 750 ml spirits daily.  Alcohol level neg on 04/11/13. Tested negative for HIV and viral hepatitis in past, but no records of this in Epic   Assessment/Plan: Intractable nausea, vomiting & some diarrhea -DD: Alcohol/NSAID-induced gastritis versus acute viral GE. Patient vomited twice 4/20 4 AM and unable to keep anything down. Watery stools this morning. -Continue antiemetics, PPI, Carafate and clear liquids as tolerated per GI -Continue IV  fluids -Appreciate GI input--no plans for EGD, fecal pancreatic elastase -LFTs and lipase normal -Avoid NSAIDs - C Diff PCR neg  Hypokalemia  -Repleted  -Check magnesium: 1.7  -check UA- neg -Urine pregnancy test is negative 04/15/2013  Normocytic anemia  -Partly dilutional. Stable. -Check iron studies: Ferritin 62, iron 34  -Check B12-924 and RBC folate given alcohol history   Alcohol abuse -Last drink was 04/11/2013 -Less likelihood of developing withdrawal but place on CIWA as precaution? Withdrawal symptoms on night of 4/23. Continue Ativan protocol -Start patient on thiamine IV  Cocaine and THC abuse - Cessation counseled.   Family Communication:   Discussed directly with patient. Disposition Plan:   Home when medically stable   Consultations: [facility]     Procedures/Studies: Dg Abd 1 View  04/04/2013  *RADIOLOGY REPORT*  Clinical Data: Abdominal pain.  ABDOMEN - 1 VIEW  Comparison: CT abdomen and pelvis 01/03/2009.  Abdominal ultrasound 07/02/2010.  Findings: Bowel gas pattern is unremarkable.  There is no evidence for obstruction or free air.  The lung bases are clear.  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  IMPRESSION: No acute abnormality.   Original Report Authenticated By: Marin Roberts, M.D.    US Abdomen Complete  04/17/2013  *RADIOLOGY REPORT*  Clinical Data:  Nausea  ABDOMINAL ULTRASOUND COMPLETE  Comparison:  07/22/2010  Findings:  Gallbladder:  No gallstones, gallbladder wall thickening, or pericholecystic fluid.  Common Bile Duct:  Within normal limits in caliber.  Liver: No focal mass lesion identified.  Within normal limits in parenchymal echogenicity.  IVC:  Appears normal.  Pancreas:  No abnormality  identified.  Spleen:  Within normal limits in size and echotexture.  Right kidney:  Normal in size and parenchymal echogenicity.  No evidence of mass or hydronephrosis.  Left kidney:  Normal in size and parenchymal  echogenicity.  No evidence of mass or hydronephrosis.  Abdominal Aorta:  No aneurysm identified.  IMPRESSION: Negative abdominal ultrasound.   Original Report Authenticated By: Signa Kell, M.D.    Ct Abdomen Pelvis W Contrast  04/17/2013  *RADIOLOGY REPORT*  Clinical Data: Abdominal pain with nausea, vomiting, diarrhea. Hematemesis.  CT ABDOMEN AND PELVIS WITH CONTRAST  Technique:  Multidetector CT imaging of the abdomen and pelvis was performed following the standard protocol during bolus administration of intravenous contrast.  Contrast: 50mL OMNIPAQUE IOHEXOL 300 MG/ML  SOLN, OMNIPAQUE IOHEXOL 300 MG/ML  SOLN  Comparison: CT scan dated 01/03/2009  Findings: The liver, spleen, biliary tree, pancreas, adrenal glands, and kidneys are normal. There is slight mucosal thickening of the distal antrum of the stomach without evidence of ulceration. The stomach is not distended which could account for the mucosal prominence.  The large and small bowel are normal including the terminal ileum and appendix.  The uterus and left ovary are normal.  There is a 3.2 cm cyst on the right ovary.  This cyst is posterior to the lower uterine segment in the cul-de-sac and there is a small amount of fluid in the cul-de-sac, which is new.  This amount of fluid is not abnormal for a female of this age.  No osseous abnormality.  IMPRESSION:  1.  3.2 cm cyst on the right ovary in the cul-de-sac with a small amount of free fluid in the pelvic cul-de-sac. 2. Slightly prominent mucosa of the distal antrum of the stomach which could represent gastritis. 3.  Otherwise benign-appearing abdomen pelvis.   Original Report Authenticated By: Francene Boyers, M.D.          Subjective: Per patient, had a rough night-was unable to sleep and sweating. Per nursing, received Ativan for possible alcohol withdrawal. This a.m., vomiting x2 and unable to keep down anything by mouth. Abdominal pain slightly better-rated 7/10 and diffuse.  Watery diarrhea x1.  Objective: Filed Vitals:   04/18/13 2128 04/19/13 0100 04/19/13 0510 04/19/13 1301  BP: 129/65 130/60 100/53 128/79  Pulse: 54 60 53 55  Temp: 98.9 F (37.2 C)  98.5 F (36.9 C) 98.8 F (37.1 C)  TempSrc: Oral  Oral Oral  Resp: 16  16 16   Height:      Weight:      SpO2: 100%  100% 100%    Intake/Output Summary (Last 24 hours) at 04/19/13 1359 Last data filed at 04/18/13 2130  Gross per 24 hour  Intake      0 ml  Output    500 ml  Net   -500 ml   Weight change:  Exam:   General: Slightly ill looking but in no obvious distress.  HEENT: No icterus, No thrush,  /AT  Cardiovascular: RRR, S1/S2, no rubs, no gallops  Respiratory: CTA bilaterally, no wheezing, no crackles, no rhonchi. No increased work of breathing.  Abdomen: Soft/+BS, epigastric tenderness without any peritoneal signs, non distended, mild guarding epigastric area. Normal bowel sounds heard.   Extremities: No edema, No lymphangitis, No petechiae, No rashes, no synovitis  CNS: Alert and oriented. No focal neurological deficits.  Data Reviewed: Basic Metabolic Panel:  Recent Labs Lab 04/15/13 2104 04/17/13 0635 04/18/13 0405 04/19/13 0458  NA 137 137 134* 138  K  3.3* 3.4* 3.5 3.6  CL 101 102 101 105  CO2 23 23 22 26   GLUCOSE 89 82 65* 76  BUN 5* 5* 3* 3*  CREATININE 0.68 0.57 0.57 0.66  CALCIUM 9.7 9.3 8.6 8.5  MG  --   --   --  1.7   Liver Function Tests:  Recent Labs Lab 04/17/13 0635  AST 22  ALT 17  ALKPHOS 66  BILITOT 0.3  PROT 7.4  ALBUMIN 4.1    Recent Labs Lab 04/17/13 0635  LIPASE 15   No results found for this basename: AMMONIA,  in the last 168 hours CBC:  Recent Labs Lab 04/15/13 2104 04/17/13 0635 04/18/13 0405 04/19/13 0458  WBC 10.4 7.1 6.8 4.0  NEUTROABS 7.7 4.9  --   --   HGB 14.0 12.9 11.7* 11.2*  HCT 39.3 37.3 33.1* 31.7*  MCV 92.9 93.5 93.8 93.2  PLT 302 303 267 266   Cardiac Enzymes: No results found for this basename:  CKTOTAL, CKMB, CKMBINDEX, TROPONINI,  in the last 168 hours BNP: No components found with this basename: POCBNP,  CBG: No results found for this basename: GLUCAP,  in the last 168 hours  Recent Results (from the past 240 hour(s))  CLOSTRIDIUM DIFFICILE BY PCR     Status: None   Collection Time    04/17/13  9:27 PM      Result Value Range Status   C difficile by pcr NEGATIVE  NEGATIVE Final     Scheduled Meds: . enoxaparin (LOVENOX) injection  40 mg Subcutaneous Q24H  . folic acid  1 mg Oral Daily  . multivitamin with minerals  1 tablet Oral Daily  . ondansetron (ZOFRAN) IV  4 mg Intravenous Q4H  . pantoprazole (PROTONIX) IV  40 mg Intravenous Q12H  . sucralfate  1 g Oral TID WC & HS  . thiamine  100 mg Oral Daily   Or  . thiamine  100 mg Intravenous Daily   Continuous Infusions: . sodium chloride 0.9 % 1,000 mL with potassium chloride 20 mEq infusion 100 mL/hr at 04/18/13 1909     Revia Nghiem, DO  [facility] Pager [phone]  If 7PM-7AM, please contact night-coverage [email or web address] Password [email or web address] 04/19/2013, 1:59 PM   LOS: 2 days

## 2013-04-20 LAB — BASIC METABOLIC PANEL
BUN: 3 mg/dL — ABNORMAL LOW (ref 6–23)
CO2: 24 mEq/L (ref 19–32)
Chloride: 103 mEq/L (ref 96–112)
Glucose, Bld: 72 mg/dL (ref 70–99)
Potassium: 3.7 mEq/L (ref 3.5–5.1)

## 2013-04-20 LAB — CBC
HCT: 32.6 % — ABNORMAL LOW (ref 36.0–46.0)
Hemoglobin: 11.4 g/dL — ABNORMAL LOW (ref 12.0–15.0)
WBC: 4.8 10*3/uL (ref 4.0–10.5)

## 2013-04-20 LAB — FOLATE RBC: RBC Folate: 644 ng/mL — ABNORMAL HIGH (ref >=366)

## 2013-04-20 MED ORDER — POTASSIUM CHLORIDE IN NACL 20-0.9 MEQ/L-% IV SOLN
INTRAVENOUS | Status: DC
  Administered 2013-04-20 – 2013-04-21 (×2): via INTRAVENOUS
  Filled 2013-04-20 (×2): qty 1000

## 2013-04-20 NOTE — Care Management (Signed)
CM updated chart utilization review.   Roxy Manns Akeela Busk,RN,BSN [phone]

## 2013-04-20 NOTE — Progress Notes (Signed)
CSW attempted to meet with pt at bedside as CSW received report that she admitted from [facility].   Pt was about to have bath and asked CSW to return.  CSW will follow up with pt re: return to [facility].  Jacklynn Lewis, MSW, LCSWA  Clinical Social Work [phone]

## 2013-04-20 NOTE — Progress Notes (Signed)
Patient ID: [name], female   DOB: 08-15-1986, 27 y.o.   MRN: [id number] [facility] Progress Note  Subjective: Had an episode of vomiting during the night, some diarrhea last evening but none since. Has kept down small amt of clears this am, and says less abdominal discomfort.   Objective:  Vital signs in last 24 hours: Temp:  [98.7 F (37.1 C)-98.9 F (37.2 C)] 98.9 F (37.2 C) (04/25 0555) Pulse Rate:  [46-55] 54 (04/25 0555) Resp:  [16] 16 (04/25 0555) BP: (101-128)/(55-79) 105/59 mmHg (04/25 0555) SpO2:  [100 %] 100 % (04/25 0555) Last BM Date: 04/19/13 General:   Alert,  Well-developed, AA female   in NAD-Mom and son in room Heart:  Regular rate and rhythm; no murmurs Pulm;clear Abdomen:  Soft, mildly tender and nondistended. Normal bowel sounds, without guarding, and without rebound.   Extremities:  Without edema. Neurologic:  Alert and  oriented x4;  grossly normal neurologically. Psych:  Alert and cooperative. Normal mood and affect.  Intake/Output from previous day: 04/24 0701 - 04/25 0700 In: 4486.7 [I.V.:4486.7] Out: 200 [Emesis/NG output:200] Intake/Output this shift:    Lab Results:  Recent Labs  04/18/13 0405 04/19/13 0458 04/20/13 0410  WBC 6.8 4.0 4.8  HGB 11.7* 11.2* 11.4*  HCT 33.1* 31.7* 32.6*  PLT 267 266 288   BMET  Recent Labs  04/18/13 0405 04/19/13 0458 04/20/13 0410  NA 134* 138 135  K 3.5 3.6 3.7  CL 101 105 103  CO2 22 26 24   GLUCOSE 65* 76 72  BUN 3* 3* 3*  CREATININE 0.57 0.66 0.67  CALCIUM 8.6 8.5 8.7   LFT No results found for this basename: PROT, ALBUMIN, AST, ALT, ALKPHOS, BILITOT, BILIDIR, IBILI,  in the last 72 hours PT/INR No results found for this basename: LABPROT, INR,  in the last 72 hours Hepatitis Panel No results found for this basename: HEPBSAG, HCVAB, HEPAIGM, HEPBIGM,  in the last 72 hours  Assessment / Plan: #1  27 yo female with acute nausea,vomiting, abdominal pain and diarrhea-  uncertain  whether all secondary to a gastroenteritis or gastritis or both- management will be the same-supportive with fluids antiemetics, and PPI until keeping down adequate po. Discussed with pt and family- will advance to full liquids- Ok for discharge over weekend when keeping down po's consistently We will sign off-available if needed.  Active Problems:   Abdominal pain, epigastric   Anemia     LOS: 3 days   Amy Esterwood  04/20/2013, 10:22 AM

## 2013-04-20 NOTE — Progress Notes (Signed)
[facility] PROGRESS NOTE  [name] VWU:[id number] DOB: [birth year] DOA: 04/17/2013 PCP: Ernst Breach, PA-C  Interim History 27 y.o. female. She is an alcoholic since age 25 and substance abuser. Told several years ago she probably had a stomach ulcer. Admitted 4/9 - 04/07/13 to hospital for nausea, vomiting and abdominal pain. 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 On 04/04/2013, she was positive for cocaine, cannabinoids and alcohol.  Discharged on once daily Protonix and prn Zofran.  LFTs normal at d/c on 04/07/13. Was not tolerating POs well at discharge 04/07/13.  Did not take GI meds after discharge.  Seen in GI office on 4/16 and rxd with Phenergen for ongoing vomiting and abdominal pain.  Admitted to [facility] (alcohol rehab) 4/17. While there did not receive any GI meds or antiemetics. They did prescribe up to 1800 mg of Ibuprofen daily for ongoing generalized upper/mid abdominal pain.  Sent to ED 04/15/13 with persistent emesis and diarrhea. Labs, including urine pregnancy test, were negative. ED provided Rx for Reglan prn and suggested using OTC Loperamide.  Sent back 04/17/13 to ED with same-->diffuse abdominal pain, nausea, emesis (non bloody). Non melenic loose stools: Last BM was 4/21.  LFTs still normal and Ct abdomen suggestive of gastritis. No liver or biliary disease on ultrasound or the CT.  No substance or ETOH use since 4/16. Previously drinking up to 12 pack malt liquor and/or 750 ml spirits daily.  Alcohol level neg on 04/11/13. Tested negative for HIV and viral hepatitis in past, but no records of this in Epic   Assessment/Plan: Intractable nausea, vomiting & some diarrhea -DD: Alcohol/NSAID-induced gastritis versus acute viral GE. Overall, patient improving. As per GI recommendations, advanced to full liquids today and possible discharge on weekend if keeping down by mouth's consistently. -Continue antiemetics, PPI,  Carafate. -Continue IV fluids -Appreciate GI input--no plans for EGD, fecal pancreatic elastase -LFTs and lipase normal -Avoid NSAIDs - C Diff PCR neg  Hypokalemia  -Repleted  -Check magnesium: 1.7  -check UA- neg -Urine pregnancy test is negative 04/15/2013  Normocytic anemia  -Partly dilutional. Stable. -Check iron studies: Ferritin 62, iron 34  -Check B12-924 and RBC folate(add to 4/25 labs) given alcohol history   Alcohol abuse -Last drink was 04/11/2013 -Less likelihood of developing withdrawal but place on CIWA as precaution? Withdrawal symptoms on night of 4/23. Continue Ativan protocol -Start patient on thiamine IV -No overt withdrawal features on 4/25. CIWA scores 1-2 over last 24 hours.  Cocaine and THC abuse - Cessation counseled.   Family Communication:   Discussed directly with patient. Disposition Plan:   Return to [facility] when medically stable-possibly over the weekend-social worker will find out if they can accept her over the weekend.   Consultations: [place] [facility]     Procedures/Studies: Dg Abd 1 View  04/04/2013  *RADIOLOGY REPORT*  Clinical Data: Abdominal pain.  ABDOMEN - 1 VIEW  Comparison: CT abdomen and pelvis 01/03/2009.  Abdominal ultrasound 07/02/2010.  Findings: Bowel gas pattern is unremarkable.  There is no evidence for obstruction or free air.  The lung bases are clear.  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  IMPRESSION: No acute abnormality.   Original Report Authenticated By: Marin Roberts, M.D.    US Abdomen Complete  04/17/2013  *RADIOLOGY REPORT*  Clinical Data:  Nausea  ABDOMINAL ULTRASOUND COMPLETE  Comparison:  07/22/2010  Findings:  Gallbladder:  No gallstones, gallbladder wall thickening, or pericholecystic fluid.  Common Bile Duct:  Within normal limits in caliber.  Liver: No focal mass lesion identified.  Within normal limits in parenchymal echogenicity.  IVC:  Appears normal.  Pancreas:   No abnormality identified.  Spleen:  Within normal limits in size and echotexture.  Right kidney:  Normal in size and parenchymal echogenicity.  No evidence of mass or hydronephrosis.  Left kidney:  Normal in size and parenchymal echogenicity.  No evidence of mass or hydronephrosis.  Abdominal Aorta:  No aneurysm identified.  IMPRESSION: Negative abdominal ultrasound.   Original Report Authenticated By: Signa Kell, M.D.    Ct Abdomen Pelvis W Contrast  04/17/2013  *RADIOLOGY REPORT*  Clinical Data: Abdominal pain with nausea, vomiting, diarrhea. Hematemesis.  CT ABDOMEN AND PELVIS WITH CONTRAST  Technique:  Multidetector CT imaging of the abdomen and pelvis was performed following the standard protocol during bolus administration of intravenous contrast.  Contrast: 50mL OMNIPAQUE IOHEXOL 300 MG/ML  SOLN, OMNIPAQUE IOHEXOL 300 MG/ML  SOLN  Comparison: CT scan dated 01/03/2009  Findings: The liver, spleen, biliary tree, pancreas, adrenal glands, and kidneys are normal. There is slight mucosal thickening of the distal antrum of the stomach without evidence of ulceration. The stomach is not distended which could account for the mucosal prominence.  The large and small bowel are normal including the terminal ileum and appendix.  The uterus and left ovary are normal.  There is a 3.2 cm cyst on the right ovary.  This cyst is posterior to the lower uterine segment in the cul-de-sac and there is a small amount of fluid in the cul-de-sac, which is new.  This amount of fluid is not abnormal for a female of this age.  No osseous abnormality.  IMPRESSION:  1.  3.2 cm cyst on the right ovary in the cul-de-sac with a small amount of free fluid in the pelvic cul-de-sac. 2. Slightly prominent mucosa of the distal antrum of the stomach which could represent gastritis. 3.  Otherwise benign-appearing abdomen pelvis.   Original Report Authenticated By: Francene Boyers, M.D.          Subjective: Overall feels much  better. No diarrhea since yesterday. She vomited x2 overnight-last one at 5 AM. Since then tolerated some Jell-O. Abdominal pain, intermittent and mild-decreasing.  Objective: Filed Vitals:   04/19/13 1301 04/19/13 2149 04/20/13 0030 04/20/13 0555  BP: 128/79 125/71 101/55 105/59  Pulse: 55 54 46 54  Temp: 98.8 F (37.1 C) 98.7 F (37.1 C)  98.9 F (37.2 C)  TempSrc: Oral Oral  Oral  Resp: 16 16  16   Height:      Weight:      SpO2: 100% 100%  100%    Intake/Output Summary (Last 24 hours) at 04/20/13 0718 Last data filed at 04/20/13 7846  Gross per 24 hour  Intake 4486.67 ml  Output    200 ml  Net 4286.67 ml   Weight change:  Exam:   General: Looks much improved, pleasant and comfortable.  HEENT: No icterus, No thrush,  [place]/AT  Cardiovascular: RRR, S1/S2, no rubs, no gallops  Respiratory: CTA bilaterally, no wheezing, no crackles, no rhonchi. No increased work of breathing.  Abdomen: Soft/+BS, soft and nontender. Normal bowel sounds heard.   Extremities: No edema, No lymphangitis, No petechiae, No rashes, no synovitis  CNS: Alert and oriented. No focal neurological deficits.  Data Reviewed: Basic Metabolic Panel:  Recent Labs Lab 04/15/13 2104 04/17/13 0635 04/18/13 0405 04/19/13 0458 04/20/13 0410  NA 137 137 134* 138  135  K 3.3* 3.4* 3.5 3.6 3.7  CL 101 102 101 105 103  CO2 23 23 22 26 24   GLUCOSE 89 82 65* 76 72  BUN 5* 5* 3* 3* 3*  CREATININE 0.68 0.57 0.57 0.66 0.67  CALCIUM 9.7 9.3 8.6 8.5 8.7  MG  --   --   --  1.7  --    Liver Function Tests:  Recent Labs Lab 04/17/13 0635  AST 22  ALT 17  ALKPHOS 66  BILITOT 0.3  PROT 7.4  ALBUMIN 4.1    Recent Labs Lab 04/17/13 0635  LIPASE 15   No results found for this basename: AMMONIA,  in the last 168 hours CBC:  Recent Labs Lab 04/15/13 2104 04/17/13 0635 04/18/13 0405 04/19/13 0458 04/20/13 0410  WBC 10.4 7.1 6.8 4.0 4.8  NEUTROABS 7.7 4.9  --   --   --   HGB 14.0 12.9 11.7*  11.2* 11.4*  HCT 39.3 37.3 33.1* 31.7* 32.6*  MCV 92.9 93.5 93.8 93.2 93.4  PLT 302 303 267 266 288   Cardiac Enzymes: No results found for this basename: CKTOTAL, CKMB, CKMBINDEX, TROPONINI,  in the last 168 hours BNP: No components found with this basename: POCBNP,  CBG: No results found for this basename: GLUCAP,  in the last 168 hours  Recent Results (from the past 240 hour(s))  CLOSTRIDIUM DIFFICILE BY PCR     Status: None   Collection Time    04/17/13  9:27 PM      Result Value Range Status   C difficile by pcr NEGATIVE  NEGATIVE Final     Scheduled Meds: . enoxaparin (LOVENOX) injection  40 mg Subcutaneous Q24H  . folic acid  1 mg Oral Daily  . multivitamin with minerals  1 tablet Oral Daily  . ondansetron (ZOFRAN) IV  4 mg Intravenous Q4H  . pantoprazole (PROTONIX) IV  40 mg Intravenous Q12H  . sucralfate  1 g Oral TID WC & HS  . thiamine  100 mg Oral Daily   Or  . thiamine  100 mg Intravenous Daily   Continuous Infusions: . sodium chloride 0.9 % 1,000 mL with potassium chloride 20 mEq infusion 100 mL/hr at 04/20/13 0013     [facility], DO  [facility] Pager [phone]  If 7PM-7AM, please contact night-coverage [email or web address] Password [facility] 04/20/2013, 7:18 AM   LOS: 3 days

## 2013-04-20 NOTE — Progress Notes (Signed)
Clinical Social Work Department BRIEF PSYCHOSOCIAL ASSESSMENT 04/20/2013  Patient:  [name], [name]     Account Number:  [id number]     Admit date:  04/17/2013  Clinical Social Worker:  Jacelyn Grip  Date/Time:  04/20/2013 11:30 AM  Referred by:  Physician  Date Referred:  04/20/2013 Referred for  Other - See comment   Other Referral:   Admitted from [facility]   Interview type:  Patient Other interview type:    PSYCHOSOCIAL DATA Living Status:  FACILITY Admitted from facility:  [facility] Level of care:   Primary support name:  Marcelino Duster Neer/[phone] Primary support relationship to patient:  PARENT Degree of support available:   adequate    CURRENT CONCERNS Current Concerns  Post-Acute Placement   Other Concerns:    SOCIAL WORK ASSESSMENT / PLAN CSW received notification pt admitted from [facility].    CSW met with pt at bedside.    Pt confirmed that she was at resident at [facility] and plans to return when medically stable.    CSW spoke to [facility] and [facility] can accept pt back and can accept pt back during weekend if needed.    [facility] can provide transportation for pt at discharge.    CSW to continue to follow and facilitate pt discharge needs when pt medically ready for discharge.   Assessment/plan status:  Psychosocial Support/Ongoing Assessment of Needs Other assessment/ plan:   discharge planning   Information/referral to community resources:   Referral back to [facility]    PATIENT'S/FAMILY'S RESPONSE TO PLAN OF CARE: Pt alert and oriented x 4. Pt agreeable to return to [facility] and [facility] can accept pt back when pt ready for discharge.    Jacklynn Lewis, MSW, LCSWA  Clinical Social Work [phone]

## 2013-04-21 NOTE — Progress Notes (Signed)
Patient ID: [name], female   DOB: 1986-04-06, 27 y.o.   MRN: [id number]  [facility] PROGRESS NOTE  [name] QMV:[id number] DOB: [birth year] DOA: 04/17/2013 PCP: Ernst Breach, PA-C  Interim History  27 y.o. female. She is an alcoholic since age 89 and substance abuser. Told several years ago she probably had a stomach ulcer.  Admitted 4/9 - 04/07/13 to hospital for nausea, vomiting and abdominal pain. 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 On 04/04/2013, she was positive for cocaine, cannabinoids and alcohol. Discharged on once daily Protonix and prn Zofran. Admitted to [facility] (alcohol rehab) 4/17. While there did not receive any GI meds or antiemetics. They did prescribe up to 1800 mg of Ibuprofen daily for ongoing generalized upper/mid abdominal pain. No substance or ETOH use since 4/16. Previously drinking up to 12 pack malt liquor and/or 750 ml spirits daily. Alcohol level neg on 04/11/13.   Assessment/Plan:  Intractable nausea, vomiting & some diarrhea  - DD: Alcohol/NSAID-induced gastritis versus acute viral GE.  - pt is clinically improving and tolerating clear liquid diet - will advance diet to regular and see if she is keeping it down - Continue antiemetics, PPI, Carafate.  Hypokalemia  - supplemented and within normal limits this am Normocytic anemia  - Partly dilutional. Stable.  - iron studies: Ferritin 62, iron 34  Alcohol abuse  - Last drink was 04/11/2013  - CIWA score 1 over past 24 hours  Cocaine and THC abuse  - Cessation counseled.   Family Communication: Discussed directly with patient.  Disposition Plan: Return to [facility] when medically stable, possibly in am  Consultations: [place] [facility]   Procedures/Studies:  Dg Abd 1 View 04/04/2013  No acute abnormality.   US Abdomen Complete  04/17/2013  Negative abdominal ultrasound.   Ct Abdomen Pelvis W Contrast  04/17/2013  1.  3.2 cm cyst on the right ovary in the cul-de-sac with a small amount of free fluid in the pelvic cul-de-sac.  2. Slightly prominent mucosa of the distal antrum of the stomach which could represent gastritis.  3. Otherwise benign-appearing abdomen pelvis.  HPI/Subjective: No events overnight.   Objective: Filed Vitals:   04/20/13 1330 04/20/13 2145 04/21/13 0637 04/21/13 1430  BP: 124/69 127/61 100/53 106/59  Pulse: 81 48 49 63  Temp: 99.4 F (37.4 C) 98.3 F (36.8 C) 99 F (37.2 C) 99.6 F (37.6 C)  TempSrc: Oral Oral Oral Oral  Resp: 16 16 16 16   Height:      Weight:      SpO2: 100% 100% 100% 99%    Intake/Output Summary (Last 24 hours) at 04/21/13 1754 Last data filed at 04/21/13 1745  Gross per 24 hour  Intake 2299.17 ml  Output      0 ml  Net 2299.17 ml    Exam:   General:  Pt is alert, follows commands appropriately, not in acute distress  Cardiovascular: Regular rate and rhythm, S1/S2, no murmurs, no rubs, no gallops  Respiratory: Clear to auscultation bilaterally, no wheezing, no crackles, no rhonchi  Abdomen: Soft, non tender, non distended, bowel sounds present, no guarding  Extremities: No edema, pulses DP and PT palpable bilaterally  Neuro: Grossly nonfocal  Data Reviewed: Basic Metabolic Panel:  Recent Labs Lab 04/15/13 2104 04/17/13 0635 04/18/13 0405 04/19/13 0458 04/20/13 0410  NA 137 137 134* 138 135  K 3.3* 3.4* 3.5 3.6 3.7  CL 101 102 101 105 103  CO2 23  23 22 26 24   GLUCOSE 89 82 65* 76 72  BUN 5* 5* 3* 3* 3*  CREATININE 0.68 0.57 0.57 0.66 0.67  CALCIUM 9.7 9.3 8.6 8.5 8.7  MG  --   --   --  1.7  --    Liver Function Tests:  Recent Labs Lab 04/17/13 0635  AST 22  ALT 17  ALKPHOS 66  BILITOT 0.3  PROT 7.4  ALBUMIN 4.1    Recent Labs Lab 04/17/13 0635  LIPASE 15   CBC:  Recent Labs Lab 04/15/13 2104 04/17/13 0635 04/18/13 0405 04/19/13 0458 04/20/13 0410  WBC 10.4 7.1 6.8 4.0 4.8  NEUTROABS 7.7 4.9  --    --   --   HGB 14.0 12.9 11.7* 11.2* 11.4*  HCT 39.3 37.3 33.1* 31.7* 32.6*  MCV 92.9 93.5 93.8 93.2 93.4  PLT 302 303 267 266 288   Recent Results (from the past 240 hour(s))  CLOSTRIDIUM DIFFICILE BY PCR     Status: None   Collection Time    04/17/13  9:27 PM      Result Value Range Status   C difficile by pcr NEGATIVE  NEGATIVE Final     Scheduled Meds: . enoxaparin (LOVENOX) injection  40 mg Subcutaneous Q24H  . folic acid  1 mg Oral Daily  . multivitamin with minerals  1 tablet Oral Daily  . ondansetron (ZOFRAN) IV  4 mg Intravenous Q4H  . pantoprazole (PROTONIX) IV  40 mg Intravenous Q12H  . sucralfate  1 g Oral TID WC & HS  . thiamine  100 mg Oral Daily   Or  . thiamine  100 mg Intravenous Daily   Continuous Infusions: . 0.9 % NaCl with KCl 20 mEq / L 50 mL/hr at 04/21/13 1610     Deborah Presto, MD  [facility] Pager [phone]  If 7PM-7AM, please contact night-coverage [email or web address] Password [facility] 04/21/2013, 5:54 PM   LOS: 4 days

## 2013-04-21 NOTE — Progress Notes (Signed)
Per MD, Pt not ready for d/c today.  Amanda Paquita Printy, LCSWA Clinical Social Work [phone]  

## 2013-04-22 LAB — BASIC METABOLIC PANEL
CO2: 26 mEq/L (ref 19–32)
Calcium: 8.5 mg/dL (ref 8.4–10.5)
Creatinine, Ser: 0.7 mg/dL (ref 0.50–1.10)
Glucose, Bld: 76 mg/dL (ref 70–99)
Sodium: 136 mEq/L (ref 135–145)

## 2013-04-22 LAB — CBC
Hemoglobin: 11.3 g/dL — ABNORMAL LOW (ref 12.0–15.0)
MCH: 33.2 pg (ref 26.0–34.0)
MCV: 93.2 fL (ref 78.0–100.0)
RBC: 3.4 MIL/uL — ABNORMAL LOW (ref 3.87–5.11)

## 2013-04-22 MED ORDER — FOLIC ACID 1 MG PO TABS: 1.0000 mg | ORAL_TABLET | Freq: Every day | ORAL | Status: DC

## 2013-04-22 MED ORDER — ONDANSETRON HCL 8 MG PO TABS: 8.0000 mg | ORAL_TABLET | Freq: Three times a day (TID) | ORAL | Status: DC | PRN

## 2013-04-22 MED ORDER — LOPERAMIDE HCL 2 MG PO CAPS: 2.0000 mg | ORAL_CAPSULE | Freq: Four times a day (QID) | ORAL | Status: DC | PRN

## 2013-04-22 MED ORDER — POTASSIUM CHLORIDE CRYS ER 20 MEQ PO TBCR
40.0000 meq | EXTENDED_RELEASE_TABLET | Freq: Once | ORAL | Status: AC
  Administered 2013-04-22: 40 meq via ORAL
  Filled 2013-04-22: qty 2

## 2013-04-22 NOTE — Progress Notes (Signed)
Per MD, Pt ready for d/c.  Notified [facility] and Pt and mom at bedside.  Mom able to provide transportation, if [facility] will allow it.  Faxed requested information to [facility].  [facility] ready to receive Pt.  [facility] ok with mom providing transportation.  Notified RN.  Pt to be d/c'd.  Providence Crosby, LCSWA Clinical Social Work [phone]

## 2013-04-22 NOTE — Progress Notes (Signed)
04/22/13  1300  Reviewed discharged with patient and her mom. Both verbalized understanding of instructions. Patient given a copy of discharge and prescriptions. Patients mom will be taken her back to the [facility].

## 2013-04-22 NOTE — Discharge Summary (Addendum)
Physician Discharge Summary  [name] ZOX:[id number] DOB: 09-28-1986 DOA: 04/17/2013  PCP: Ernst Breach, PA-C  Admit date: 04/17/2013 Discharge date: 04/22/2013  Recommendations for Outpatient Follow-up:  1. Pt will need to follow up with PCP in 2-3 weeks post discharge 2. Please obtain BMP to evaluate electrolytes and kidney function, potassium level 3. Please also check CBC to evaluate Hg and Hct level 4. Please stop IBUPROFEN use   Discharge Diagnoses: Gastroenteritis  Active Problems:   Abdominal pain, epigastric   Anemia  Discharge Condition: Stable  Diet recommendation: Heart healthy diet discussed in details   Interim History  27 y.o. female. She is an alcoholic since age 31 and substance abuser. Told several years ago she probably had a stomach ulcer.  Admitted 4/9 - 04/07/13 to hospital for nausea, vomiting and abdominal pain. 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 On 04/04/2013, she was positive for cocaine, cannabinoids and alcohol. Discharged on once daily Protonix and prn Zofran. Admitted to [facility] (alcohol rehab) 4/17. While there did not receive any GI meds or antiemetics. They did prescribe up to 1800 mg of Ibuprofen daily for ongoing generalized upper/mid abdominal pain. No substance or ETOH use since 4/16. Previously drinking up to 12 pack malt liquor and/or 750 ml spirits daily. Alcohol level neg on 04/11/13.  Assessment/Plan:  Intractable nausea, vomiting & some diarrhea  - DD: Alcohol/NSAID-induced gastritis versus acute viral GE.  - pt is clinically improving and tolerating regular diet well - Continue antiemetics as needed - stop NSAID Ibuprofen  Hypokalemia  - supplemented and additional dose of Kdur 40 MEQ given prior to discharge  Normocytic anemia  - Partly dilutional. Stable.  - iron studies: Ferritin 62, iron 34  Alcohol abuse  - Last drink was 04/11/2013  Cocaine and THC abuse  - Cessation  counseled.   Family Communication: Discussed directly with patient.  Disposition Plan: Return to [facility]  Consultations: [facility]  Procedures/Studies:  Dg Abd 1 View  04/04/2013  No acute abnormality.  US Abdomen Complete  04/17/2013  Negative abdominal ultrasound.  Ct Abdomen Pelvis W Contrast  04/17/2013  1. 3.2 cm cyst on the right ovary in the cul-de-sac with a small amount of free fluid in the pelvic cul-de-sac.  2. Slightly prominent mucosa of the distal antrum of the stomach which could represent gastritis.  3. Otherwise benign-appearing abdomen pelvis.      Discharge Exam: Filed Vitals:   04/22/13 0609  BP: 108/59  Pulse: 52  Temp: 98.4 F (36.9 C)  Resp: 16   Filed Vitals:   04/21/13 0637 04/21/13 1430 04/21/13 2139 04/22/13 0609  BP: 100/53 106/59 93/52 108/59  Pulse: 49 63 62 52  Temp: 99 F (37.2 C) 99.6 F (37.6 C) 98.6 F (37 C) 98.4 F (36.9 C)  TempSrc: Oral Oral Oral Oral  Resp: 16 16 16 16   Height:      Weight:      SpO2: 100% 99% 100% 100%    General: Pt is alert, follows commands appropriately, not in acute distress Cardiovascular: Regular rate and rhythm, S1/S2 +, no murmurs, no rubs, no gallops Respiratory: Clear to auscultation bilaterally, no wheezing, no crackles, no rhonchi Abdominal: Soft, non tender, non distended, bowel sounds +, no guarding Extremities: no edema, no cyanosis, pulses palpable bilaterally DP and PT Neuro: Grossly nonfocal  Discharge Instructions  Discharge Orders   Future Orders Complete By Expires     Diet - low sodium  heart healthy  As directed     Increase activity slowly  As directed         Medication List    TAKE these medications       folic acid 1 MG tablet  Commonly known as:  FOLVITE  Take 1 tablet (1 mg total) by mouth daily.     loperamide 2 MG capsule  Commonly known as:  IMODIUM  Take 1 capsule (2 mg total) by mouth 4 (four) times daily as needed for diarrhea or  loose stools.     multivitamin with minerals Tabs  Take 1 tablet by mouth daily.     ondansetron 8 MG tablet  Commonly known as:  ZOFRAN  Take 1 tablet (8 mg total) by mouth every 8 (eight) hours as needed for nausea.     promethazine 25 MG tablet  Commonly known as:  PHENERGAN  Take 1 tablet (25 mg total) by mouth every 6 (six) hours as needed for nausea.           Follow-up Information   Follow up with Aleen Campi, DAVID SHANE, PA-C In 2 weeks.   Contact information:   [address] [place] [address] [phone]       Follow up with [name], MD.   Contact information:   [address] [place] [address] [phone] cell       The results of significant diagnostics from this hospitalization (including imaging, microbiology, ancillary and laboratory) are listed below for reference.     Microbiology: Recent Results (from the past 240 hour(s))  CLOSTRIDIUM DIFFICILE BY PCR     Status: None   Collection Time    04/17/13  9:27 PM      Result Value Range Status   C difficile by pcr NEGATIVE  NEGATIVE Final     Labs: Basic Metabolic Panel:  Recent Labs Lab 04/17/13 0635 04/18/13 0405 04/19/13 0458 04/20/13 0410 04/22/13 0435  NA 137 134* 138 135 136  K 3.4* 3.5 3.6 3.7 3.4*  CL 102 101 105 103 103  CO2 23 22 26 24 26   GLUCOSE 82 65* 76 72 76  BUN 5* 3* 3* 3* <3*  CREATININE 0.57 0.57 0.66 0.67 0.70  CALCIUM 9.3 8.6 8.5 8.7 8.5  MG  --   --  1.7  --   --    Liver Function Tests:  Recent Labs Lab 04/17/13 0635  AST 22  ALT 17  ALKPHOS 66  BILITOT 0.3  PROT 7.4  ALBUMIN 4.1    Recent Labs Lab 04/17/13 0635  LIPASE 15   CBC:  Recent Labs Lab 04/15/13 2104 04/17/13 0635 04/18/13 0405 04/19/13 0458 04/20/13 0410 04/22/13 0435  WBC 10.4 7.1 6.8 4.0 4.8 5.1  NEUTROABS 7.7 4.9  --   --   --   --   HGB 14.0 12.9 11.7* 11.2* 11.4* 11.3*  HCT 39.3 37.3 33.1* 31.7* 32.6* 31.7*  MCV 92.9  93.5 93.8 93.2 93.4 93.2  PLT 302 303 267 266 288 316   SIGNED: Time coordinating discharge: Over 30 minutes  [name], MD  [facility] 04/22/2013, 10:08 AM Pager [phone]  If 7PM-7AM, please contact night-coverage [email or web address] Password [email or web address]

## 2013-04-23 ENCOUNTER — Telehealth: Payer: Self-pay | Admitting: Family Medicine

## 2013-04-23 NOTE — Telephone Encounter (Signed)
LMOM TO CB. CLS 

## 2013-04-23 NOTE — Telephone Encounter (Signed)
Message copied by Janeice Robinson on Mon Apr 23, 2013  3:29 PM ------      Message from: Aleen Campi, DAVID S      Created: Mon Apr 23, 2013  7:29 AM       I have reviewed the recent chart activity.   She was just discharged again yesterday from the ED.   I wanted to call (patient/mother) to make sure she is reporting back to [facility] either today or she may already be back there.              Make sure she doesn't take anymore Ibuprofen, and make sure she tells them at [facility] that she may need medication prn for nausea.  If any issues let me know.             ------

## 2013-04-24 NOTE — Telephone Encounter (Signed)
LMOM TO CB. CLS 

## 2013-04-24 NOTE — Telephone Encounter (Signed)
Patient's mother said that her daughter was in the hospital from Tuesday and discharged on Sunday and then she returned back to [facility] where she currently is. CLS

## 2013-04-25 LAB — PANCREATIC ELASTASE, FECAL: Pancreatic Elastase-1, Stool: >500 mcg/g

## 2013-08-07 ENCOUNTER — Encounter: Payer: Self-pay | Admitting: Medical

## 2013-08-07 ENCOUNTER — Ambulatory Visit (INDEPENDENT_AMBULATORY_CARE_PROVIDER_SITE_OTHER): Payer: BC Managed Care – PPO | Admitting: Medical

## 2013-08-07 VITALS — BP 100/70 | HR 68 | Temp 98.4°F | Resp 16 | Wt 134.0 lb

## 2013-08-07 DIAGNOSIS — H00019 Hordeolum externum unspecified eye, unspecified eyelid: Secondary | ICD-10-CM

## 2013-08-07 DIAGNOSIS — F1011 Alcohol abuse, in remission: Secondary | ICD-10-CM

## 2013-08-07 DIAGNOSIS — H00016 Hordeolum externum left eye, unspecified eyelid: Secondary | ICD-10-CM

## 2013-08-07 MED ORDER — ERYTHROMYCIN 5 MG/GM OP OINT: TOPICAL_OINTMENT | Freq: Four times a day (QID) | OPHTHALMIC | Status: DC

## 2013-08-07 NOTE — Patient Instructions (Addendum)
Sty A sty (hordeolum) is an infection of a gland in the eyelid located at the base of the eyelash. A sty may develop a white or yellow head of pus. It can be puffy (swollen). Usually, the sty will burst and pus will come out on its own. They do not leave lumps in the eyelid once they drain. A sty is often confused with another form of cyst of the eyelid called a chalazion. Chalazions occur within the eyelid and not on the edge where the bases of the eyelashes are. They often are red, sore and then form firm lumps in the eyelid. CAUSES   Germs (bacteria).  Lasting (chronic) eyelid inflammation. SYMPTOMS   Tenderness, redness and swelling along the edge of the eyelid at the base of the eyelashes.  Sometimes, there is a white or yellow head of pus. It may or may not drain. DIAGNOSIS  An ophthalmologist will be able to distinguish between a sty and a chalazion and treat the condition appropriately.  TREATMENT   Styes are typically treated with warm packs (compresses) until drainage occurs.  In rare cases, medicines that kill germs (antibiotics) may be prescribed. These antibiotics may be in the form of drops, cream or pills.  If a hard lump has formed, it is generally necessary to do a small incision and remove the hardened contents of the cyst in a minor surgical procedure done in the office.  In suspicious cases, your caregiver may send the contents of the cyst to the lab to be certain that it is not a rare, but dangerous form of cancer of the glands of the eyelid. HOME CARE INSTRUCTIONS   Wash your hands often and dry them with a clean towel. Avoid touching your eyelid. This may spread the infection to other parts of the eye.  Apply heat to your eyelid for 10 to 20 minutes, several times a day, to ease pain and help to heal it faster.  Do not squeeze the sty. Allow it to drain on its own. Wash your eyelid carefully 3 to 4 times per day to remove any pus. SEEK IMMEDIATE MEDICAL CARE IF:    Your eye becomes painful or puffy (swollen).  Your vision changes.  Your sty does not drain by itself within 3 days.  Your sty comes back within a short period of time, even with treatment.  You have redness (inflammation) around the eye.  You have a fever. Document Released: 09/22/2005 Document Revised: 03/06/2012 Document Reviewed: 05/27/2009 [facility] Patient Information 2014 [place], [place].   RESOURCES in [place], [place]  If you are experiencing a mental health crisis or an emergency, please call 911 or go to the nearest emergency department.  [facility]   [phone] [facility]  [phone] [facility]   [phone]  Suicide Hotline 1-800-Suicide 873-449-2234)  National Suicide Prevention Lifeline 250-214-0015  954-718-6745)  Domestic Violence, Rape/Crisis - [facility] [place] 742-595-6387  [employer] Violence Hotline 1-800-799-SAFE 6574555285)  To report Child or Elder Abuse, please call: [facility] Police Department  [phone] [facility] Department  [phone]  [facility] Crisis Line [phone]  Teen Crisis line [phone] or [phone]     Psychiatry and Counseling services   Dr. Andee Poles, psychiatry [phone] office [email or web address] [address], [address] [place], [place], [place] [address] Dr. Andee Poles Valinda Hoar, NP Grayland Ormond, NP  Anxiety, Depression, ADHD, OCD, Eating Disorders, Bipolar, other   [facility] [phone] office [email or web address] [address] [place], [place]  [address]  Dr. Lynden Ang, psychiatry services  Dr. Bennie Dallas  Depression, Anxiety, Substance Abuse, Couples Issues, Adolescent Issues Oneta Rack, NP Depression, Anxiety, ADHD, Women's issues, Bipolar Disorder, Substance   Abuse Saul Fordyce, NP Depression, Anxiety, Aging, ADHD, Bipolar Disorder, Substance  Abuse Manuela Neptune, NP  Mood disturbances, ADHD, children, adolescents, adults Geronimo Running, Therapist Sexual Addiction, Bipolar, Depression, Anxiety, Substance Addiction Shaaron Adler, Therapist Grief and Loss, Anxiety, Depression, Bipolar, Medical Challenges, Life    Transitions Michaelle Copas, Therapist Substance Abuse, Relationships, Clergy Families, Anger and Stress Management, Postpartum Depression, Pre-Marital Counseling Rochele Raring, Therapist Autsim, Anxiety, Depression, ADHD, Adjustment Disorder, PTSD, Grief and Loss, Divorce, Adoption Concerns   [facility] [phone] office [email or web address] [address], [address] [place], [place], [place] [address]  Franchot Erichsen, MA, clinical psychologist  Cognitive-Behavior Therapy; Mood Disorders; Anxiety Disorders; adult and child ADHD; Family Therapy; Stress Management; personal growth, and Marital Therapy.    Carlus Pavlov Ph.D., clinical psychologist Cognitive-Behavior Therapy; Mood Disorders; Anxiety Disorders; Stress     Management   Miguel Aschoff Ph.D., clinical psychologist [phone] office [address] [place], [place] [address] Cognitive Behavior Therapy, Depression, Bipolar, Anxiety, Grief and Loss    [facility] [phone] office [address], [place], [place] [address] Crisis services, Family support, in home therapy, treatment for Anxiety, PTSD, Sexual Assault, Substance Abuse, Financial/Credit Counseling, Variety of other services    [email or web address] [phone] office [address] [place], [place] [address]  Veneda Melter, Ph.D., [facility] Family, Couples, Anxiety, Depression, ADHD, Abuse, Anger Management Sherie Don, M.Ed., LPC Couples, Sexual orientation, Domestic violence, Child Abuse, Major Life Change,  Depression Leandra Kern,  [place] Marriage counseling, Women's Issues, Depression, Intimacy, Career Issues Madelaine Etienne, Ph.D.,  LPC PTSD, Addictions, Grief, Anxiety, Sexual Orientation Reather Laurence, [facility] Teen and child depression, anxiety, parenting challenges, Adult depression, self injury, relationship issues. Maple Hudson, [facility] Addiction, PTSD, Eating Disorders, Depression, Sexual Orientation Daun Peacock, [facility] Eating disorder, Anxiety and Depression, Grief, Divorce, Couples and Family Counseling, Parenting   Dr. Archer Asa, psychiatry [phone] office [address], [place], [place] [address]  Geriatric psychiatry services   [facility] [phone] office, 24x7 help line [email or web address] [address] [place], [place] [address] Substance Abuse, Depression, Anxiety, Mood Disorders, other Addictions, DWI Assessment/Treatment, Teen Issues, ADHD, Family Therapy Dr. Ezzard Flax, Psychiatry services   Posey Rea, Therapist Initial assessments, Clinical Director, Substance Abuse counseling, DWI and DMV assessments, individual and group counseling Arrie Senate, Therapist Depression, Anxiety, Dysfunctional families, Individual and Couples Counseling, Addiction, Sexual Abuse, Childhood Trauma, Spiritually Based Counseling Robin Ringer, Therapist [facility], Children and Adult Individual Counseling, Depression, Anxiety, Mood Disorders Danice Goltz, Therapist Ages 5 and up, individual, couple and family therapy, family concerns, ADHD, Mood disorders, Grief, Substance Abuse Weston Settle, Therapist Female patients only - Mood disorders, Depression, Anxiety, PTSD, Gried,   Abuse, Relationships   Dr. Milagros Evener, psychiatry [phone] office [address], [place], [place] [address]

## 2013-08-07 NOTE — Progress Notes (Signed)
Subjective:   [name] is a 27 y.o. female who presents for evaluation of stye in the left eye. She has noticed the above symptoms for 2 months. Onset was gradual.  Other symptoms include matting in eyelid, and the stye got bigger this week.  Patient denies blurred vision, discharge, erythema and but there is some irritation and discomfort.  She has used nothing for symptoms. No other aggravating or relieving factors.    At her prior visit, we had worked to get her treatment for alcohol abuse.  Since last visit she has completed an intensive residency program for alcohol abuse.  Has been abstained from alcohol, feels much better, trying to get her life straightened out.   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   Overall though is happy, feels like she is moving in the right direction.  She is attending AA.   No other c/o.    The following portions of the patient's history were reviewed and updated as appropriate: allergies, current medications, past family history and past medical history.  ROS as in subjective   Objective:   Filed Vitals:   08/07/13 1203  BP: 100/70  Pulse: 68  Temp: 98.4 F (36.9 C)  Resp: 16    Gen: WD, WN, NAD Eyes:  conjunctivae/corneas clear. PERRL, EOM's intact. Fundi benign., left upper lateral eyelid with round 2mm swollen mass suggestive of stye  Skin: orbital region with no erythema, ecchymosis  Assessment:   Encounter Diagnoses  Name Primary?  . Stye, left Yes  . History of alcohol abuse      Plan:   Discussed symptoms, findings, and diagnosis.   Discussed usual course of treatment and expected time to improve.  Begin Emycin ointment, warm compresses.   If not improving in 1-2wk, call back for referral to ophthalmology.  Glad to see she is doing much better in regards to her treatment program, AA, abstaining from ETOH.  F/u soon for physical, advised she c/t counseling.

## 2013-09-25 ENCOUNTER — Encounter: Payer: BC Managed Care – PPO | Admitting: Medical

## 2013-10-29 LAB — OB RESULTS CONSOLE PLATELET COUNT: Platelets: 357 10*3/uL

## 2013-10-29 LAB — OB RESULTS CONSOLE GC/CHLAMYDIA: Chlamydia: NEGATIVE

## 2013-10-29 LAB — GLUCOSE TOLERANCE, 1 HOUR (50G) W/O FASTING: Glucose, GTT - 1 Hour: 115 mg/dL (ref ?–200)

## 2013-10-30 ENCOUNTER — Encounter (HOSPITAL_COMMUNITY): Payer: Self-pay | Admitting: Nurse Practitioner

## 2013-11-07 ENCOUNTER — Encounter: Payer: Self-pay | Admitting: *Deleted

## 2013-11-08 ENCOUNTER — Ambulatory Visit (INDEPENDENT_AMBULATORY_CARE_PROVIDER_SITE_OTHER): Payer: BC Managed Care – PPO | Admitting: Obstetrics & Gynecology

## 2013-11-08 ENCOUNTER — Encounter: Payer: Self-pay | Admitting: Obstetrics & Gynecology

## 2013-11-08 VITALS — BP 99/65 | Temp 99.2°F | Wt 134.7 lb

## 2013-11-08 DIAGNOSIS — Z3490 Encounter for supervision of normal pregnancy, unspecified, unspecified trimester: Secondary | ICD-10-CM | POA: Insufficient documentation

## 2013-11-08 DIAGNOSIS — Z3491 Encounter for supervision of normal pregnancy, unspecified, first trimester: Secondary | ICD-10-CM

## 2013-11-08 DIAGNOSIS — O99330 Smoking (tobacco) complicating pregnancy, unspecified trimester: Secondary | ICD-10-CM

## 2013-11-08 LAB — POCT URINALYSIS DIP (DEVICE)
Glucose, UA: NEGATIVE mg/dL
Nitrite: NEGATIVE
Urobilinogen, UA: 0.2 mg/dL (ref 0.0–1.0)

## 2013-11-08 MED ORDER — PRENATAL VITAMINS 0.8 MG PO TABS: 1.0000 | ORAL_TABLET | Freq: Every day | ORAL | Status: DC

## 2013-11-08 MED ORDER — FOLIC ACID 1 MG PO TABS: 1.0000 mg | ORAL_TABLET | Freq: Every day | ORAL | Status: DC

## 2013-11-08 NOTE — Progress Notes (Signed)
Transfer from [facility] for seizure disorder.  Had seizures as a child; then had two seizures in 2012 and one seizure in 01/2013.  These last three seizures occurred in the context of alcohol withdrawal; but patient reports she was told she had an underlying seizure disorder.   Has never been on medications.  Denies any recent alcohol use or other substance use; no recent seizures.  No other concerns.  Had full physical exam, pap smear at [facility]. Also was set up for first trimester screen at [facility] on 11/20/13.  Will set up referral with neurology for evaluation and follow up recommendations.  Prenatal vitamins and folic acid prescribed.  Return in 4 weeks.

## 2013-11-08 NOTE — Progress Notes (Signed)
Pulse- 76 Patient needs PNV, has not been able to get any; reports lower abdominal pressure

## 2013-11-08 NOTE — Patient Instructions (Signed)
No other complaints or concerns.  Fetal movement and labor precautions reviewed.  

## 2013-11-09 LAB — ALCOHOL METABOLITE (ETG), URINE: Ethyl Glucuronide (EtG): NEGATIVE ng/mL

## 2013-11-12 LAB — PRESCRIPTION MONITORING PROFILE (19 PANEL)
Barbiturate Screen, Urine: NEGATIVE ng/mL
Carisoprodol, Urine: NEGATIVE ng/mL
Cocaine Metabolites: NEGATIVE ng/mL
Creatinine, Urine: 87.24 mg/dL (ref >20.0)
Fentanyl, Ur: NEGATIVE ng/mL
MDMA URINE: NEGATIVE ng/mL
Meperidine, Ur: NEGATIVE ng/mL
Methadone Screen, Urine: NEGATIVE ng/mL
Oxycodone Screen, Ur: NEGATIVE ng/mL
Tramadol Scrn, Ur: NEGATIVE ng/mL
pH, Initial: 7.2 pH (ref 4.5–8.9)

## 2013-11-12 LAB — CANNABANOIDS (GC/LC/MS), URINE: THC-COOH (GC/LC/MS), ur confirm: 686 ng/mL — AB

## 2013-11-19 ENCOUNTER — Other Ambulatory Visit (HOSPITAL_COMMUNITY): Payer: Self-pay | Admitting: Nurse Practitioner

## 2013-11-19 DIAGNOSIS — Z3682 Encounter for antenatal screening for nuchal translucency: Secondary | ICD-10-CM

## 2013-11-20 ENCOUNTER — Encounter (HOSPITAL_COMMUNITY): Payer: Self-pay

## 2013-11-20 ENCOUNTER — Ambulatory Visit (HOSPITAL_COMMUNITY)
Admission: RE | Admit: 2013-11-20 | Discharge: 2013-11-20 | Disposition: A | Discharge status: Home / Self Care | Payer: BC Managed Care – PPO | Source: Ambulatory Visit | Attending: Nurse Practitioner | Admitting: Nurse Practitioner

## 2013-11-20 ENCOUNTER — Ambulatory Visit (HOSPITAL_COMMUNITY): Admission: RE | Admit: 2013-11-20 | Payer: BC Managed Care – PPO | Source: Ambulatory Visit

## 2013-11-20 ENCOUNTER — Other Ambulatory Visit (HOSPITAL_COMMUNITY): Payer: Self-pay | Admitting: Nurse Practitioner

## 2013-11-20 DIAGNOSIS — Z3689 Encounter for other specified antenatal screening: Secondary | ICD-10-CM | POA: Insufficient documentation

## 2013-11-20 DIAGNOSIS — G40909 Epilepsy, unspecified, not intractable, without status epilepticus: Secondary | ICD-10-CM | POA: Insufficient documentation

## 2013-11-20 DIAGNOSIS — O99340 Other mental disorders complicating pregnancy, unspecified trimester: Secondary | ICD-10-CM | POA: Insufficient documentation

## 2013-11-20 DIAGNOSIS — F1910 Other psychoactive substance abuse, uncomplicated: Secondary | ICD-10-CM | POA: Insufficient documentation

## 2013-11-20 DIAGNOSIS — Z3682 Encounter for antenatal screening for nuchal translucency: Secondary | ICD-10-CM

## 2013-11-20 DIAGNOSIS — O3510X0 Maternal care for (suspected) chromosomal abnormality in fetus, unspecified, not applicable or unspecified: Secondary | ICD-10-CM | POA: Insufficient documentation

## 2013-11-20 DIAGNOSIS — O351XX0 Maternal care for (suspected) chromosomal abnormality in fetus, not applicable or unspecified: Secondary | ICD-10-CM | POA: Insufficient documentation

## 2013-12-03 ENCOUNTER — Other Ambulatory Visit: Payer: Self-pay | Admitting: Family Medicine

## 2013-12-03 DIAGNOSIS — Z3682 Encounter for antenatal screening for nuchal translucency: Secondary | ICD-10-CM

## 2013-12-04 ENCOUNTER — Ambulatory Visit (HOSPITAL_COMMUNITY)
Admission: RE | Admit: 2013-12-04 | Discharge: 2013-12-04 | Disposition: A | Discharge status: Home / Self Care | Payer: BC Managed Care – PPO | Source: Ambulatory Visit | Attending: Medical | Admitting: Medical

## 2013-12-04 ENCOUNTER — Other Ambulatory Visit: Payer: Self-pay

## 2013-12-04 ENCOUNTER — Ambulatory Visit (HOSPITAL_COMMUNITY)
Admission: RE | Admit: 2013-12-04 | Discharge: 2013-12-04 | Disposition: A | Discharge status: Home / Self Care | Payer: BC Managed Care – PPO | Source: Ambulatory Visit | Attending: Nurse Practitioner | Admitting: Nurse Practitioner

## 2013-12-04 ENCOUNTER — Encounter (HOSPITAL_COMMUNITY): Payer: Self-pay

## 2013-12-04 VITALS — BP 122/65 | HR 93 | Wt 131.0 lb

## 2013-12-04 DIAGNOSIS — O99340 Other mental disorders complicating pregnancy, unspecified trimester: Secondary | ICD-10-CM | POA: Insufficient documentation

## 2013-12-04 DIAGNOSIS — O09899 Supervision of other high risk pregnancies, unspecified trimester: Secondary | ICD-10-CM | POA: Insufficient documentation

## 2013-12-04 DIAGNOSIS — Z3491 Encounter for supervision of normal pregnancy, unspecified, first trimester: Secondary | ICD-10-CM

## 2013-12-04 DIAGNOSIS — F1910 Other psychoactive substance abuse, uncomplicated: Secondary | ICD-10-CM | POA: Insufficient documentation

## 2013-12-04 DIAGNOSIS — O351XX0 Maternal care for (suspected) chromosomal abnormality in fetus, not applicable or unspecified: Secondary | ICD-10-CM | POA: Insufficient documentation

## 2013-12-04 DIAGNOSIS — O3510X0 Maternal care for (suspected) chromosomal abnormality in fetus, unspecified, not applicable or unspecified: Secondary | ICD-10-CM | POA: Insufficient documentation

## 2013-12-04 DIAGNOSIS — Z3682 Encounter for antenatal screening for nuchal translucency: Secondary | ICD-10-CM

## 2013-12-04 DIAGNOSIS — Z3689 Encounter for other specified antenatal screening: Secondary | ICD-10-CM | POA: Insufficient documentation

## 2013-12-04 DIAGNOSIS — G40909 Epilepsy, unspecified, not intractable, without status epilepticus: Secondary | ICD-10-CM | POA: Insufficient documentation

## 2013-12-06 ENCOUNTER — Ambulatory Visit (INDEPENDENT_AMBULATORY_CARE_PROVIDER_SITE_OTHER): Payer: BC Managed Care – PPO | Admitting: Family Medicine

## 2013-12-06 ENCOUNTER — Encounter: Payer: Self-pay | Admitting: Family Medicine

## 2013-12-06 VITALS — BP 98/63 | Wt 131.9 lb

## 2013-12-06 DIAGNOSIS — Z3492 Encounter for supervision of normal pregnancy, unspecified, second trimester: Secondary | ICD-10-CM

## 2013-12-06 DIAGNOSIS — G40909 Epilepsy, unspecified, not intractable, without status epilepticus: Secondary | ICD-10-CM

## 2013-12-06 DIAGNOSIS — R51 Headache: Secondary | ICD-10-CM

## 2013-12-06 DIAGNOSIS — Z3480 Encounter for supervision of other normal pregnancy, unspecified trimester: Secondary | ICD-10-CM

## 2013-12-06 DIAGNOSIS — R519 Headache, unspecified: Secondary | ICD-10-CM

## 2013-12-06 LAB — POCT URINALYSIS DIP (DEVICE)
Glucose, UA: NEGATIVE mg/dL
Nitrite: NEGATIVE
Protein, ur: NEGATIVE mg/dL
Urobilinogen, UA: 0.2 mg/dL (ref 0.0–1.0)

## 2013-12-06 MED ORDER — BUTALBITAL-APAP-CAFFEINE 50-325-40 MG PO TABS: 1.0000 | ORAL_TABLET | Freq: Four times a day (QID) | ORAL | Status: DC | PRN

## 2013-12-06 NOTE — Addendum Note (Signed)
Addended by: Kathee Delton on: 12/06/2013 10:10 AM   Modules accepted: Orders

## 2013-12-06 NOTE — Progress Notes (Signed)
No lof no vb no ctx + Headaches- Fioricet - neurology refurral  COTY STUDENT is a 27 y.o. [id number] at [redacted]w[redacted]d by L=10 here for ROB visit.  Discussed with Patient:  - New OB labs from last visit were wnl. - RTC for any VB, regular, painful cramps/ctxs occurring at a rate of >2/10 min, fever (100.5 or higher), n/v/d, any pain that is unresolving or worsening. - Routine precautions(SAB, depression, infection s/s) - RTC in 4 weeks for next appt.  To Do: 1.   [ ]  Vaccines: Flu: recd  Tdap:  [ ]  BCM: mirena  Edu: [x ] PTL precautions; [ ]  BF class; [ ]  childbirth class; [ ]   BF counseling;

## 2013-12-06 NOTE — Patient Instructions (Signed)
Second Trimester of Pregnancy The second trimester is from week 13 through week 28, months 4 through 6. The second trimester is often a time when you feel your best. Your body has also adjusted to being pregnant, and you begin to feel better physically. Usually, morning sickness has lessened or quit completely, you may have more energy, and you may have an increase in appetite. The second trimester is also a time when the fetus is growing rapidly. At the end of the sixth month, the fetus is about 9 inches long and weighs about 1 pounds. You will likely begin to feel the baby move (quickening) between 18 and 20 weeks of the pregnancy. BODY CHANGES Your body goes through many changes during pregnancy. The changes vary from woman to woman.   Your weight will continue to increase. You will notice your lower abdomen bulging out.  You may begin to get stretch marks on your hips, abdomen, and breasts.  You may develop headaches that can be relieved by medicines approved by your caregiver.  You may urinate more often because the fetus is pressing on your bladder.  You may develop or continue to have heartburn as a result of your pregnancy.  You may develop constipation because certain hormones are causing the muscles that push waste through your intestines to slow down.  You may develop hemorrhoids or swollen, bulging veins (varicose veins).  You may have back pain because of the weight gain and pregnancy hormones relaxing your joints between the bones in your pelvis and as a result of a shift in weight and the muscles that support your balance.  Your breasts will continue to grow and be tender.  Your gums may bleed and may be sensitive to brushing and flossing.  Dark spots or blotches (chloasma, mask of pregnancy) may develop on your face. This will likely fade after the baby is born.  A dark line from your belly button to the pubic area (linea nigra) may appear. This will likely fade after the  baby is born. WHAT TO EXPECT AT YOUR PRENATAL VISITS During a routine prenatal visit:  You will be weighed to make sure you and the fetus are growing normally.  Your blood pressure will be taken.  Your abdomen will be measured to track your baby's growth.  The fetal heartbeat will be listened to.  Any test results from the previous visit will be discussed. Your caregiver may ask you:  How you are feeling.  If you are feeling the baby move.  If you have had any abnormal symptoms, such as leaking fluid, bleeding, severe headaches, or abdominal cramping.  If you have any questions. Other tests that may be performed during your second trimester include:  Blood tests that check for:  Low iron levels (anemia).  Gestational diabetes (between 24 and 28 weeks).  Rh antibodies.  Urine tests to check for infections, diabetes, or protein in the urine.  An ultrasound to confirm the proper growth and development of the baby.  An amniocentesis to check for possible genetic problems.  Fetal screens for spina bifida and Down syndrome. HOME CARE INSTRUCTIONS   Avoid all smoking, herbs, alcohol, and unprescribed drugs. These chemicals affect the formation and growth of the baby.  Follow your caregiver's instructions regarding medicine use. There are medicines that are either safe or unsafe to take during pregnancy.  Exercise only as directed by your caregiver. Experiencing uterine cramps is a good sign to stop exercising.  Continue to eat regular,   healthy meals.  Wear a good support bra for breast tenderness.  Do not use hot tubs, steam rooms, or saunas.  Wear your seat belt at all times when driving.  Avoid raw meat, uncooked cheese, cat litter boxes, and soil used by cats. These carry germs that can cause birth defects in the baby.  Take your prenatal vitamins.  Try taking a stool softener (if your caregiver approves) if you develop constipation. Eat more high-fiber foods,  such as fresh vegetables or fruit and whole grains. Drink plenty of fluids to keep your urine clear or pale yellow.  Take warm sitz baths to soothe any pain or discomfort caused by hemorrhoids. Use hemorrhoid cream if your caregiver approves.  If you develop varicose veins, wear support hose. Elevate your feet for 15 minutes, 3 4 times a day. Limit salt in your diet.  Avoid heavy lifting, wear low heel shoes, and practice good posture.  Rest with your legs elevated if you have leg cramps or low back pain.  Visit your dentist if you have not gone yet during your pregnancy. Use a soft toothbrush to brush your teeth and be gentle when you floss.  A sexual relationship may be continued unless your caregiver directs you otherwise.  Continue to go to all your prenatal visits as directed by your caregiver. SEEK MEDICAL CARE IF:   You have dizziness.  You have mild pelvic cramps, pelvic pressure, or nagging pain in the abdominal area.  You have persistent nausea, vomiting, or diarrhea.  You have a bad smelling vaginal discharge.  You have pain with urination. SEEK IMMEDIATE MEDICAL CARE IF:   You have a fever.  You are leaking fluid from your vagina.  You have spotting or bleeding from your vagina.  You have severe abdominal cramping or pain.  You have rapid weight gain or loss.  You have shortness of breath with chest pain.  You notice sudden or extreme swelling of your face, hands, ankles, feet, or legs.  You have not felt your baby move in over an hour.  You have severe headaches that do not go away with medicine.  You have vision changes. Document Released: 12/07/2001 Document Revised: 08/15/2013 Document Reviewed: 02/13/2013 [employer] Patient Information 2014 [employer].  

## 2013-12-06 NOTE — Progress Notes (Signed)
TC to [facility] and spoke with Sedalia Muta, new patient coordinator. She requested to have the referral placed in the work que and she will call the patient.

## 2013-12-06 NOTE — Progress Notes (Signed)
Pulse: 78

## 2013-12-07 ENCOUNTER — Encounter: Payer: Self-pay | Admitting: *Deleted

## 2013-12-11 ENCOUNTER — Encounter (INDEPENDENT_AMBULATORY_CARE_PROVIDER_SITE_OTHER): Payer: Self-pay

## 2013-12-11 ENCOUNTER — Ambulatory Visit (INDEPENDENT_AMBULATORY_CARE_PROVIDER_SITE_OTHER): Payer: BC Managed Care – PPO | Admitting: Diagnostic Neuroimaging

## 2013-12-11 ENCOUNTER — Encounter: Payer: Self-pay | Admitting: Diagnostic Neuroimaging

## 2013-12-11 VITALS — BP 104/63 | HR 80 | Temp 98.0°F | Ht 62.0 in | Wt 131.0 lb

## 2013-12-11 DIAGNOSIS — G43009 Migraine without aura, not intractable, without status migrainosus: Secondary | ICD-10-CM

## 2013-12-11 DIAGNOSIS — Z3490 Encounter for supervision of normal pregnancy, unspecified, unspecified trimester: Secondary | ICD-10-CM

## 2013-12-11 NOTE — Progress Notes (Signed)
[facility]  PATIENT: [name] DOB: 03/16/1986  REFERRING CLINICIAN: Katrinka Blazing HISTORY FROM: patient REASON FOR VISIT: new consult   HISTORICAL  CHIEF COMPLAINT:  Chief Complaint  Patient presents with  . Headache    HISTORY OF PRESENT ILLNESS:   27 year old right-handed female, G3 P1, 14-3/[redacted] weeks EGA, here for evaluation of headaches.  Patient reports lifelong history of global, bifrontal headaches, right greater than left, with nausea, vomiting, photophobia and phonophobia. Patient never officially diagnosed with migraine headaches. She typically takes Tylenol and lay down in a dark quiet room.  Since this pregnancy onset, patient's headaches have significantly worsen. They feel the same type but more severe. She's tried taking Tylenol without relief. She was prescribed Fioricet but has not taken it. Last headache was past Friday.    REVIEW OF SYSTEMS: Full 14 system review of systems performed and notable only for headache, dizziness, prior seizure, eye pain.  ALLERGIES: No Known Allergies  HOME MEDICATIONS: Outpatient Prescriptions Prior to Visit  Medication Sig Dispense Refill  . folic acid (FOLVITE) 1 MG tablet Take 1 tablet (1 mg total) by mouth daily.  30 tablet  10  . Prenatal Multivit-Min-Fe-FA (PRENATAL VITAMINS) 0.8 MG tablet Take 1 tablet by mouth daily.  30 tablet  12  . Multiple Vitamin (MULITIVITAMIN WITH MINERALS) TABS Take 1 tablet by mouth daily.      . butalbital-acetaminophen-caffeine (FIORICET) 50-325-40 MG per tablet Take 1-2 tablets by mouth every 6 (six) hours as needed for headache.  20 tablet  0   No facility-administered medications prior to visit.    PAST MEDICAL HISTORY: Past Medical History  Diagnosis Date  . Stomach ulcer     never had any tests to confirm this diagni=oses  . Polysubstance abuse   . Adjustment disorder with emotional disturbance   . Metabolic acidosis 03/2013     hospitaliation due to  alcohol abuse, dehyration, nausea/vomiting  . 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 Hematemesis 03/2013    along with alcoholic gastritis, hospitalization  . Pyelonephritis 03/2010    hospitalization  . Seizures     PAST SURGICAL HISTORY: Past Surgical History  Procedure Laterality Date  . Breast lumpectomy Right     benign pathology  . Cesarean section  2005    FAMILY HISTORY: Family History  Problem Relation Age of Onset  . Diabetes Mother   . Hypertension Father   . Diabetes Maternal Aunt   . Diabetes Maternal Uncle   . Cancer Maternal Uncle   . Diabetes Maternal Grandmother   . Cancer Maternal Grandmother   . Diabetes Maternal Grandfather     SOCIAL HISTORY:  History   Social History  . Marital Status: Single    Spouse Name: N/A    Number of Children: 1  . Years of Education: College   Occupational History  .  Other    [employer]   Social History Main Topics  . Smoking status: Former Smoker    Types: Cigarettes    Quit date: 11/26/2013  . Smokeless tobacco: Never Used  . Alcohol Use: No     Comment: binge drinking; quit Aug. 2014  . Drug Use: Yes    Special: Marijuana, Cocaine     Comment: quit Nov. 2014  . Sexual Activity: Yes   Other Topics Concern  . Not on file   Social History Narrative   Homeless, lives with friends, family, in hotels, random locations.  Mother is supportive.  No relationship with father.   Questionable relationship with siblings?  No significant other.  Unemployed as of 03/2013.   Patient lives with family.   Caffeine Use: none     PHYSICAL EXAM  Filed Vitals:   12/11/13 0923  BP: 104/63  Pulse: 80  Temp: 98 F (36.7 C)  TempSrc: Oral  Height: 5\' 2"  (1.575 m)  Weight: 131 lb (59.421 kg)    Not recorded    Body mass index is 23.95 kg/(m^2).  GENERAL EXAM: Patient is in no distress; well developed, nourished and groomed; neck is supple  CARDIOVASCULAR: Regular rate and rhythm, no  murmurs, no carotid bruits  NEUROLOGIC: MENTAL STATUS: awake, alert, oriented to person, place and time, recent and remote memory intact, normal attention and concentration, language fluent, comprehension intact, naming intact, fund of knowledge appropriate CRANIAL NERVE: no papilledema on fundoscopic exam, pupils equal and reactive to light, visual fields full to confrontation, extraocular muscles intact, no nystagmus, facial sensation and strength symmetric, hearing intact, palate elevates symmetrically, uvula midline, shoulder shrug symmetric, tongue midline. MOTOR: normal bulk and tone, full strength in the BUE, BLE SENSORY: normal and symmetric to light touch, pinprick, temperature, vibration COORDINATION: finger-nose-finger, fine finger movements, heel-shin normal REFLEXES: deep tendon reflexes present and symmetric GAIT/STATION: narrow based gait; romberg is negative    DIAGNOSTIC DATA (LABS, IMAGING, TESTING) - I reviewed patient records, labs, notes, testing and imaging myself where available.  Lab Results  Component Value Date   WBC 5.1 04/22/2013   HGB 12.8 10/29/2013   HCT 39 10/29/2013   MCV 93.2 04/22/2013   PLT 357 10/29/2013      Component Value Date/Time   NA 136 04/22/2013 0435   K 3.4* 04/22/2013 0435   CL 103 04/22/2013 0435   CO2 26 04/22/2013 0435   GLUCOSE 76 04/22/2013 0435   BUN <3* 04/22/2013 0435   CREATININE 0.70 04/22/2013 0435   CALCIUM 8.5 04/22/2013 0435   PROT 7.4 04/17/2013 0635   ALBUMIN 4.1 04/17/2013 0635   AST 22 04/17/2013 0635   ALT 17 04/17/2013 0635   ALKPHOS 66 04/17/2013 0635   BILITOT 0.3 04/17/2013 0635   GFRNONAA >90 04/22/2013 0435   GFRAA >90 04/22/2013 0435   No results found for this basename: CHOL, HDL, LDLCALC, LDLDIRECT, TRIG, CHOLHDL   No results found for this basename: HGBA1C   Lab Results  Component Value Date   VITAMINB12 924* 04/19/2013   No results found for this basename: TSH      ASSESSMENT AND PLAN  27 y.o. year old  female here with history of migraine headaches, with increasing severity of headaches during this pregnancy. Patient is G3 P1, 14-3/[redacted] weeks EGA. Neurologic examination is normal.  PLAN: - Agree with Tylenol and Fioricet treatment of HA - Hold off on neuroimaging at this time (neuro exam normal, migraine history) - Advised patient of nonpharmacologic therapies: hydration, massage, rest, sleep, regular meals, avoidance of triggers - If headaches continue to worsen in the future we may consider opiates/Tylenol combination or triptans (sumatriptan) for symptom control. Could consider verapamil for migraine prevention. Management during pregnancy is somewhat challenging as we have to balance the benefit of medication with potential risk to developing fetus.   Return in about 3 months (around 03/11/2014).    Suanne Marker, MD 12/11/2013, 10:04 AM Certified in Neurology, Neurophysiology and Neuroimaging  [facility] [address], [address] [place], [place] [address] [phone]

## 2013-12-11 NOTE — Patient Instructions (Signed)
Focus on rest, relaxation, sleep, hydration and good eating habits.  Use tylenol as needed for HA.  For severe HA, try fioricet, but try to limit to 5-10 tabs per month.

## 2013-12-27 NOTE — L&D Delivery Note (Signed)
Delivery Note Called to delivery. At 8:53 AM a viable female was delivered via VBAC, Spontaneous (Presentation: Left Occiput Anterior).  APGAR: 8, 9.   Cord clamped x 2 and cut. Active management of 3rd stage with traction and Pitocin. Placenta status: Intact, Spontaneous.  Cord: 3 vessels with the following complications: None. Superficial periurethral lacerations left and right, not repaired,hemostatic.  Cord pH: n/a Counts correct. Hemostatic.   Anesthesia: Epidural  Episiotomy: None Lacerations:  Suture Repair: none Est. Blood Loss (mL): 500  Mom to postpartum.  Baby to Couplet care / Skin to Skin.  Sunnie Nielsenlexander, Berlyn Saylor 06/09/2014, 9:22 AM

## 2013-12-27 NOTE — L&D Delivery Note (Signed)
I was present for the delivery and Dr. Shawnie PonsPratt was present for the delivery of placenta and inspection of perineum and agree with above.  HBsAg not on chart, ordered.   [name], CNM 06/09/2014 11:37 AM

## 2014-01-02 ENCOUNTER — Telehealth: Payer: Self-pay | Admitting: Diagnostic Neuroimaging

## 2014-01-02 ENCOUNTER — Encounter: Payer: Self-pay | Admitting: Diagnostic Neuroimaging

## 2014-01-02 NOTE — Telephone Encounter (Signed)
Called pt to inform her of her appt change. No voice on the message but it beeped. Attempted to leave a message and mailed letter of change

## 2014-01-03 ENCOUNTER — Ambulatory Visit (INDEPENDENT_AMBULATORY_CARE_PROVIDER_SITE_OTHER): Payer: BC Managed Care – PPO | Admitting: Obstetrics & Gynecology

## 2014-01-03 VITALS — BP 107/54 | Temp 98.9°F | Wt 133.6 lb

## 2014-01-03 DIAGNOSIS — Z3492 Encounter for supervision of normal pregnancy, unspecified, second trimester: Secondary | ICD-10-CM

## 2014-01-03 DIAGNOSIS — Z3480 Encounter for supervision of other normal pregnancy, unspecified trimester: Secondary | ICD-10-CM

## 2014-01-03 LAB — POCT URINALYSIS DIP (DEVICE)
Bilirubin Urine: NEGATIVE
Glucose, UA: NEGATIVE mg/dL
HGB URINE DIPSTICK: NEGATIVE
KETONES UR: NEGATIVE mg/dL
Leukocytes, UA: NEGATIVE
Nitrite: NEGATIVE
PROTEIN: NEGATIVE mg/dL
SPECIFIC GRAVITY, URINE: 1.02 (ref 1.005–1.030)
Urobilinogen, UA: 0.2 mg/dL (ref 0.0–1.0)
pH: 8.5 — ABNORMAL HIGH (ref 5.0–8.0)

## 2014-01-03 NOTE — Progress Notes (Signed)
U/S scheduled on 01/31/13 at 8 am.

## 2014-01-03 NOTE — Progress Notes (Signed)
P=88 

## 2014-01-03 NOTE — Progress Notes (Signed)
Normal first screen, MSAFP draw today.  Anatomy scan scheduled.  No other complaints or concerns.  Routine obstetric precautions reviewed.

## 2014-01-03 NOTE — Patient Instructions (Signed)
Return to clinic for any obstetric concerns or go to [facility] for evaluation  

## 2014-01-13 ENCOUNTER — Encounter (HOSPITAL_COMMUNITY): Payer: Self-pay | Admitting: Emergency Medicine

## 2014-01-13 ENCOUNTER — Emergency Department (HOSPITAL_COMMUNITY)
Admission: EM | Admit: 2014-01-13 | Discharge: 2014-01-13 | Disposition: A | Discharge status: Home / Self Care | Payer: BC Managed Care – PPO | Attending: Emergency Medicine | Admitting: Emergency Medicine

## 2014-01-13 DIAGNOSIS — Z8669 Personal history of other diseases of the nervous system and sense organs: Secondary | ICD-10-CM | POA: Insufficient documentation

## 2014-01-13 DIAGNOSIS — O9989 Other specified diseases and conditions complicating pregnancy, childbirth and the puerperium: Secondary | ICD-10-CM | POA: Insufficient documentation

## 2014-01-13 DIAGNOSIS — R112 Nausea with vomiting, unspecified: Secondary | ICD-10-CM

## 2014-01-13 DIAGNOSIS — Z8659 Personal history of other mental and behavioral disorders: Secondary | ICD-10-CM | POA: Insufficient documentation

## 2014-01-13 DIAGNOSIS — Z862 Personal history of diseases of the blood and blood-forming organs and certain disorders involving the immune mechanism: Secondary | ICD-10-CM | POA: Insufficient documentation

## 2014-01-13 DIAGNOSIS — Z8639 Personal history of other endocrine, nutritional and metabolic disease: Secondary | ICD-10-CM | POA: Insufficient documentation

## 2014-01-13 DIAGNOSIS — R197 Diarrhea, unspecified: Secondary | ICD-10-CM | POA: Insufficient documentation

## 2014-01-13 DIAGNOSIS — Z8719 Personal history of other diseases of the digestive system: Secondary | ICD-10-CM | POA: Insufficient documentation

## 2014-01-13 DIAGNOSIS — Z87891 Personal history of nicotine dependence: Secondary | ICD-10-CM | POA: Insufficient documentation

## 2014-01-13 DIAGNOSIS — O210 Mild hyperemesis gravidarum: Secondary | ICD-10-CM | POA: Insufficient documentation

## 2014-01-13 DIAGNOSIS — R1084 Generalized abdominal pain: Secondary | ICD-10-CM | POA: Insufficient documentation

## 2014-01-13 DIAGNOSIS — Z79899 Other long term (current) drug therapy: Secondary | ICD-10-CM | POA: Insufficient documentation

## 2014-01-13 DIAGNOSIS — Z8711 Personal history of peptic ulcer disease: Secondary | ICD-10-CM | POA: Insufficient documentation

## 2014-01-13 DIAGNOSIS — Z87448 Personal history of other diseases of urinary system: Secondary | ICD-10-CM | POA: Insufficient documentation

## 2014-01-13 LAB — CBC WITH DIFFERENTIAL/PLATELET
Basophils Absolute: 0 10*3/uL (ref 0.0–0.1)
Basophils Relative: 0 % (ref 0–1)
EOS ABS: 0 10*3/uL (ref 0.0–0.7)
Eosinophils Relative: 0 % (ref 0–5)
HEMATOCRIT: 33 % — AB (ref 36.0–46.0)
Hemoglobin: 11.8 g/dL — ABNORMAL LOW (ref 12.0–15.0)
LYMPHS PCT: 9 % — AB (ref 12–46)
Lymphs Abs: 0.9 10*3/uL (ref 0.7–4.0)
MCH: 31.6 pg (ref 26.0–34.0)
MCHC: 35.8 g/dL (ref 30.0–36.0)
MCV: 88.5 fL (ref 78.0–100.0)
MONO ABS: 0.4 10*3/uL (ref 0.1–1.0)
Monocytes Relative: 4 % (ref 3–12)
Neutro Abs: 8.8 10*3/uL — ABNORMAL HIGH (ref 1.7–7.7)
Neutrophils Relative %: 86 % — ABNORMAL HIGH (ref 43–77)
Platelets: 296 10*3/uL (ref 150–400)
RBC: 3.73 MIL/uL — ABNORMAL LOW (ref 3.87–5.11)
RDW: 12.8 % (ref 11.5–15.5)
WBC: 10.2 10*3/uL (ref 4.0–10.5)

## 2014-01-13 LAB — URINALYSIS, ROUTINE W REFLEX MICROSCOPIC
BILIRUBIN URINE: NEGATIVE
Glucose, UA: NEGATIVE mg/dL
Hgb urine dipstick: NEGATIVE
NITRITE: NEGATIVE
Protein, ur: NEGATIVE mg/dL
Specific Gravity, Urine: >1.03 — ABNORMAL HIGH (ref 1.005–1.030)
UROBILINOGEN UA: 0.2 mg/dL (ref 0.0–1.0)
pH: 6 (ref 5.0–8.0)

## 2014-01-13 LAB — COMPREHENSIVE METABOLIC PANEL
ALK PHOS: 81 U/L (ref 39–117)
ALT: 12 U/L (ref 0–35)
AST: 20 U/L (ref 0–37)
Albumin: 3.8 g/dL (ref 3.5–5.2)
BUN: 6 mg/dL (ref 6–23)
CALCIUM: 9 mg/dL (ref 8.4–10.5)
CO2: 18 meq/L — AB (ref 19–32)
Chloride: 99 mEq/L (ref 96–112)
Creatinine, Ser: 0.43 mg/dL — ABNORMAL LOW (ref 0.50–1.10)
GLUCOSE: 59 mg/dL — AB (ref 70–99)
POTASSIUM: 3.6 meq/L — AB (ref 3.7–5.3)
SODIUM: 138 meq/L (ref 137–147)
Total Bilirubin: 0.4 mg/dL (ref 0.3–1.2)
Total Protein: 7.8 g/dL (ref 6.0–8.3)

## 2014-01-13 LAB — URINE MICROSCOPIC-ADD ON

## 2014-01-13 MED ORDER — ONDANSETRON 4 MG PO TBDP: 4.0000 mg | ORAL_TABLET | Freq: Three times a day (TID) | ORAL | Status: DC | PRN

## 2014-01-13 MED ORDER — ONDANSETRON HCL 4 MG/2ML IJ SOLN
4.0000 mg | Freq: Once | INTRAMUSCULAR | Status: AC
  Administered 2014-01-13: 4 mg via INTRAVENOUS
  Filled 2014-01-13: qty 2

## 2014-01-13 MED ORDER — SODIUM CHLORIDE 0.9 % IV BOLUS (SEPSIS)
1000.0000 mL | Freq: Once | INTRAVENOUS | Status: AC
  Administered 2014-01-13: 1000 mL via INTRAVENOUS

## 2014-01-13 NOTE — ED Notes (Addendum)
[redacted] weeks pregnant, no complications with this pregnancy. LOV:01-10-14 for four week checkup.   G3P1, AB1.  Does not feel baby moving as of yet.

## 2014-01-13 NOTE — ED Provider Notes (Signed)
CSN: [id number]     Arrival date & time 01/13/14  1508 History   First MD Initiated Contact with Patient 01/13/14 1817     Chief Complaint  Patient presents with  . Abdominal Pain  . Emesis   (Consider location/radiation/quality/duration/timing/severity/associated sxs/prior Treatment) HPI Comments: Patient who is currently [redacted] weeks pregnant presents today with nausea, vomiting, and diarrhea.  She reports onset of symptoms last evening after eating chicken fried rice from a [employer].  She reports numerous episodes of vomiting and diarrhea.  No blood in her emesis or blood in her stool.  She has not taken anything for her symptoms prior to arrival.  She reports mild diffuse abdominal cramping after numerous episodes of vomiting, but no abdominal pain at this time.  She denies vaginal bleeding or vaginal discharge.  Denies urinary symptoms.  Denies fever or chills.  She is followed by [facility] for her pregnancy.  She reports that she has not had any complications with her pregnancy thus far.  She has had an OB ultrasound, which she reports was normal.    Patient is a 28 y.o. female presenting with abdominal pain and vomiting. The history is provided by the patient.  Abdominal Pain Associated symptoms: vomiting   Emesis Associated symptoms: abdominal pain     Past Medical History  Diagnosis Date  . Stomach ulcer     never had any tests to confirm this diagni=oses  . Polysubstance abuse   . Adjustment disorder with emotional disturbance   . Metabolic acidosis 03/2013     hospitaliation due to alcohol abuse, dehyration, nausea/vomiting  . 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 Hematemesis 03/2013    along with alcoholic gastritis, hospitalization  . Pyelonephritis 03/2010    hospitalization  . Seizures    Past Surgical History  Procedure Laterality Date  . Breast lumpectomy Right     benign pathology  . Cesarean section  2005   Family History  Problem  Relation Age of Onset  . Diabetes Mother   . Hypertension Father   . Diabetes Maternal Aunt   . Diabetes Maternal Uncle   . Cancer Maternal Uncle   . Diabetes Maternal Grandmother   . Cancer Maternal Grandmother   . Diabetes Maternal Grandfather    History  Substance Use Topics  . Smoking status: Former Smoker    Types: Cigarettes    Quit date: 11/26/2013  . Smokeless tobacco: Never Used  . Alcohol Use: No     Comment: binge drinking; quit Aug. 2014   OB History   Grav Para Term Preterm Abortions TAB SAB Ect Mult Living   3 1 0 1 1 1 0 0 0 1      Review of Systems  Gastrointestinal: Positive for vomiting and abdominal pain.  All other systems reviewed and are negative.    Allergies  Review of patient's allergies indicates no known allergies.  Home Medications   Current Outpatient Rx  Name  Route  Sig  Dispense  Refill  . butalbital-acetaminophen-caffeine (FIORICET) 50-325-40 MG per tablet   Oral   Take 1-2 tablets by mouth every 6 (six) hours as needed for headache.   20 tablet   0   . folic acid (FOLVITE) 1 MG tablet   Oral   Take 1 tablet (1 mg total) by mouth daily.   30 tablet   10   . Prenatal Multivit-Min-Fe-FA (PRENATAL VITAMINS) 0.8 MG tablet   Oral   Take 1 tablet  by mouth daily.   30 tablet   12    BP 119/62  Pulse 101  Temp(Src) 99 F (37.2 C) (Oral)  Resp 18  SpO2 100%  LMP 08/24/2013 Physical Exam  Nursing note and vitals reviewed. Constitutional: She appears well-developed and well-nourished.  HENT:  Head: Normocephalic and atraumatic.  Mouth/Throat: Oropharynx is clear and moist.  Neck: Normal range of motion. Neck supple.  Cardiovascular: Normal rate, regular rhythm and normal heart sounds.   Pulmonary/Chest: Effort normal and breath sounds normal.  Abdominal: Soft. Bowel sounds are normal. She exhibits no distension and no mass. There is no tenderness. There is no rebound and no guarding.  Neurological: She is alert.  Skin:  Skin is warm and dry.  Psychiatric: She has a normal mood and affect.    ED Course  Procedures (including critical care time) Labs Review Labs Reviewed  COMPREHENSIVE METABOLIC PANEL - Abnormal; Notable for the following:    Potassium 3.6 (*)    CO2 18 (*)    Glucose, Bld 59 (*)    Creatinine, Ser 0.43 (*)    All other components within normal limits  URINALYSIS, ROUTINE W REFLEX MICROSCOPIC - Abnormal; Notable for the following:    Specific Gravity, Urine >1.030 (*)    Ketones, ur >80 (*)    Leukocytes, UA TRACE (*)    All other components within normal limits  URINE MICROSCOPIC-ADD ON  CBC WITH DIFFERENTIAL   Imaging Review No results found.  EKG Interpretation   None      8:36 PM Reassessed patient.  She reports that her nausea has improved at this time.  Will fluid challenge and reassess.  9:07 PM Patient tolerating PO liquids.  Repeat abdominal exam.  No abdominal tenderness to palpation. MDM  No diagnosis found. Patient who is currently pregnant presents today with a chief complaint of nausea, vomiting, and diarrhea.  Symptoms began last evening.  Patient with no abdominal pain on exam.  Patient denies vaginal bleeding or discharge.  Nausea improved after given IV Zofran.  Patient tolerating PO liquids.  Feel that the patient is stable for discharge.  Patient discharged home with Rx for Zofran.  Strict return precautions given.    Santiago GladHeather Ellice Boultinghouse, PA-C 01/15/14 [phone]

## 2014-01-13 NOTE — ED Notes (Addendum)
Pt reports abd pain and vomiting that started last night after eating chinese food. Reports she called her PCP and was told to come here. Denies any urinary symptoms at this time. Reports she is [redacted] weeks pregnant, going for regular visits.

## 2014-01-15 ENCOUNTER — Telehealth: Payer: Self-pay | Admitting: Diagnostic Neuroimaging

## 2014-01-15 NOTE — Telephone Encounter (Signed)
done

## 2014-01-16 ENCOUNTER — Encounter: Payer: Self-pay | Admitting: *Deleted

## 2014-01-21 NOTE — ED Provider Notes (Signed)
Medical screening examination/treatment/procedure(s) were performed by non-physician practitioner and as supervising physician I was immediately available for consultation/collaboration.  EKG Interpretation   None         Oaklee Esther, MD 01/21/14 0706 

## 2014-01-31 ENCOUNTER — Ambulatory Visit (HOSPITAL_COMMUNITY)
Admission: RE | Admit: 2014-01-31 | Discharge: 2014-01-31 | Disposition: A | Discharge status: Home / Self Care | Payer: BC Managed Care – PPO | Source: Ambulatory Visit | Attending: Obstetrics & Gynecology | Admitting: Obstetrics & Gynecology

## 2014-01-31 ENCOUNTER — Encounter: Payer: BC Managed Care – PPO | Admitting: Obstetrics and Gynecology

## 2014-01-31 ENCOUNTER — Encounter: Payer: Self-pay | Admitting: Obstetrics & Gynecology

## 2014-01-31 DIAGNOSIS — O99340 Other mental disorders complicating pregnancy, unspecified trimester: Secondary | ICD-10-CM | POA: Insufficient documentation

## 2014-01-31 DIAGNOSIS — O09899 Supervision of other high risk pregnancies, unspecified trimester: Secondary | ICD-10-CM | POA: Insufficient documentation

## 2014-01-31 DIAGNOSIS — F1910 Other psychoactive substance abuse, uncomplicated: Secondary | ICD-10-CM | POA: Insufficient documentation

## 2014-01-31 DIAGNOSIS — G40909 Epilepsy, unspecified, not intractable, without status epilepticus: Secondary | ICD-10-CM | POA: Diagnosis not present

## 2014-01-31 DIAGNOSIS — O99350 Diseases of the nervous system complicating pregnancy, unspecified trimester: Principal | ICD-10-CM

## 2014-01-31 DIAGNOSIS — O34219 Maternal care for unspecified type scar from previous cesarean delivery: Secondary | ICD-10-CM | POA: Insufficient documentation

## 2014-01-31 DIAGNOSIS — Z3492 Encounter for supervision of normal pregnancy, unspecified, second trimester: Secondary | ICD-10-CM

## 2014-02-24 LAB — OB RESULTS CONSOLE RUBELLA ANTIBODY, IGM: Rubella: IMMUNE

## 2014-03-12 ENCOUNTER — Encounter: Payer: Self-pay | Admitting: Family

## 2014-03-12 ENCOUNTER — Ambulatory Visit (INDEPENDENT_AMBULATORY_CARE_PROVIDER_SITE_OTHER): Payer: BC Managed Care – PPO | Admitting: Family

## 2014-03-12 VITALS — BP 98/61 | Temp 98.2°F | Wt 133.9 lb

## 2014-03-12 DIAGNOSIS — O34219 Maternal care for unspecified type scar from previous cesarean delivery: Secondary | ICD-10-CM

## 2014-03-12 DIAGNOSIS — Z3490 Encounter for supervision of normal pregnancy, unspecified, unspecified trimester: Secondary | ICD-10-CM

## 2014-03-12 DIAGNOSIS — Z3480 Encounter for supervision of other normal pregnancy, unspecified trimester: Secondary | ICD-10-CM

## 2014-03-12 LAB — POCT URINALYSIS DIP (DEVICE)
Bilirubin Urine: NEGATIVE
Glucose, UA: NEGATIVE mg/dL
Hgb urine dipstick: NEGATIVE
Ketones, ur: NEGATIVE mg/dL
NITRITE: NEGATIVE
PROTEIN: NEGATIVE mg/dL
Urobilinogen, UA: 0.2 mg/dL (ref 0.0–1.0)
pH: 6 (ref 5.0–8.0)

## 2014-03-12 NOTE — Progress Notes (Signed)
Pt doing well; reviewed previous csection (transverse lie, PTL (Dr. Rana SnareLowe), cervix closed) > csection at 36.4 wks.  Reviewed consent > pt opts for TOLAC, consent signed.  Growth ultrasound scheduled per previous scan recommendation.

## 2014-03-12 NOTE — Progress Notes (Signed)
P=94  Pt cannot stay today for 28 wk labs, will schedule lab appt only within next 2 weeks

## 2014-03-15 ENCOUNTER — Ambulatory Visit: Payer: BC Managed Care – PPO | Admitting: Diagnostic Neuroimaging

## 2014-03-18 ENCOUNTER — Ambulatory Visit (HOSPITAL_COMMUNITY)
Admission: RE | Admit: 2014-03-18 | Discharge: 2014-03-18 | Disposition: A | Discharge status: Home / Self Care | Payer: BC Managed Care – PPO | Source: Ambulatory Visit | Attending: Family | Admitting: Family

## 2014-03-18 DIAGNOSIS — O99350 Diseases of the nervous system complicating pregnancy, unspecified trimester: Secondary | ICD-10-CM

## 2014-03-18 DIAGNOSIS — F1910 Other psychoactive substance abuse, uncomplicated: Secondary | ICD-10-CM | POA: Insufficient documentation

## 2014-03-18 DIAGNOSIS — Z3689 Encounter for other specified antenatal screening: Secondary | ICD-10-CM | POA: Insufficient documentation

## 2014-03-18 DIAGNOSIS — O09899 Supervision of other high risk pregnancies, unspecified trimester: Secondary | ICD-10-CM | POA: Insufficient documentation

## 2014-03-18 DIAGNOSIS — G40909 Epilepsy, unspecified, not intractable, without status epilepticus: Secondary | ICD-10-CM | POA: Insufficient documentation

## 2014-03-18 DIAGNOSIS — Z3490 Encounter for supervision of normal pregnancy, unspecified, unspecified trimester: Secondary | ICD-10-CM

## 2014-03-18 DIAGNOSIS — O99340 Other mental disorders complicating pregnancy, unspecified trimester: Secondary | ICD-10-CM | POA: Insufficient documentation

## 2014-03-24 ENCOUNTER — Encounter: Payer: Self-pay | Admitting: Family

## 2014-03-26 ENCOUNTER — Ambulatory Visit: Payer: BC Managed Care – PPO | Admitting: Diagnostic Neuroimaging

## 2014-03-26 ENCOUNTER — Other Ambulatory Visit: Payer: Medicaid Other

## 2014-03-26 DIAGNOSIS — Z3490 Encounter for supervision of normal pregnancy, unspecified, unspecified trimester: Secondary | ICD-10-CM

## 2014-03-26 DIAGNOSIS — Z3480 Encounter for supervision of other normal pregnancy, unspecified trimester: Secondary | ICD-10-CM

## 2014-03-26 LAB — CBC
HEMATOCRIT: 30.3 % — AB (ref 36.0–46.0)
Hemoglobin: 10.5 g/dL — ABNORMAL LOW (ref 12.0–15.0)
MCH: 31.5 pg (ref 26.0–34.0)
MCHC: 34.7 g/dL (ref 30.0–36.0)
MCV: 91 fL (ref 78.0–100.0)
PLATELETS: 298 10*3/uL (ref 150–400)
RBC: 3.33 MIL/uL — ABNORMAL LOW (ref 3.87–5.11)
RDW: 13.7 % (ref 11.5–15.5)
WBC: 11.8 10*3/uL — ABNORMAL HIGH (ref 4.0–10.5)

## 2014-03-26 LAB — HIV ANTIBODY (ROUTINE TESTING W REFLEX): HIV: NONREACTIVE

## 2014-03-26 LAB — RPR

## 2014-03-26 LAB — GLUCOSE TOLERANCE, 1 HOUR (50G) W/O FASTING: Glucose, 1 Hour GTT: 125 mg/dL (ref 70–140)

## 2014-03-27 ENCOUNTER — Encounter: Payer: Self-pay | Admitting: *Deleted

## 2014-04-09 ENCOUNTER — Ambulatory Visit (INDEPENDENT_AMBULATORY_CARE_PROVIDER_SITE_OTHER): Payer: BC Managed Care – PPO | Admitting: Obstetrics & Gynecology

## 2014-04-09 ENCOUNTER — Encounter: Payer: Self-pay | Admitting: Obstetrics & Gynecology

## 2014-04-09 VITALS — BP 100/62 | Temp 97.1°F | Wt 135.5 lb

## 2014-04-09 DIAGNOSIS — Z3480 Encounter for supervision of other normal pregnancy, unspecified trimester: Secondary | ICD-10-CM

## 2014-04-09 DIAGNOSIS — Z23 Encounter for immunization: Secondary | ICD-10-CM

## 2014-04-09 DIAGNOSIS — Z3490 Encounter for supervision of normal pregnancy, unspecified, unspecified trimester: Secondary | ICD-10-CM

## 2014-04-09 DIAGNOSIS — O34219 Maternal care for unspecified type scar from previous cesarean delivery: Secondary | ICD-10-CM

## 2014-04-09 LAB — POCT URINALYSIS DIP (DEVICE)
Bilirubin Urine: NEGATIVE
Glucose, UA: NEGATIVE mg/dL
HGB URINE DIPSTICK: NEGATIVE
Ketones, ur: NEGATIVE mg/dL
Nitrite: NEGATIVE
PROTEIN: NEGATIVE mg/dL
SPECIFIC GRAVITY, URINE: 1.015 (ref 1.005–1.030)
UROBILINOGEN UA: 0.2 mg/dL (ref 0.0–1.0)
pH: 7.5 (ref 5.0–8.0)

## 2014-04-09 MED ORDER — TETANUS-DIPHTH-ACELL PERTUSSIS 5-2.5-18.5 LF-MCG/0.5 IM SUSP
0.5000 mL | Freq: Once | INTRAMUSCULAR | Status: AC
  Administered 2014-04-09: 0.5 mL via INTRAMUSCULAR

## 2014-04-09 NOTE — Progress Notes (Signed)
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 No other complaints or concerns.  Fetal movement and labor precautions reviewed.

## 2014-04-09 NOTE — Progress Notes (Signed)
Pulse- 98  Edema-feet Pain/pressure- lower abd

## 2014-04-09 NOTE — Addendum Note (Signed)
Addended by: Faythe CasaBELLAMY, JEANETTA M on: 04/09/2014 09:31 AM   Modules accepted: Orders

## 2014-04-09 NOTE — Patient Instructions (Signed)
Return to clinic for any obstetric concerns or go to [facility] for evaluation  

## 2014-04-22 ENCOUNTER — Telehealth: Payer: Self-pay

## 2014-04-22 NOTE — Telephone Encounter (Signed)
Pt. Called and would like to know what she can take for cold. Called pt. Who stated she is feeling OK and will just wait to talk to provider tomorrow at her appointment. Informed pt. We can give her the approved OTC medication list when she comes in for visit so that she will have it the next time she has a cold. Pt. Verbalized understanding and gratitude. No further questions or concerns.

## 2014-04-23 ENCOUNTER — Ambulatory Visit (INDEPENDENT_AMBULATORY_CARE_PROVIDER_SITE_OTHER): Payer: BC Managed Care – PPO | Admitting: Obstetrics & Gynecology

## 2014-04-23 VITALS — BP 102/60 | HR 82 | Temp 97.0°F | Wt 133.7 lb

## 2014-04-23 DIAGNOSIS — Z3490 Encounter for supervision of normal pregnancy, unspecified, unspecified trimester: Secondary | ICD-10-CM

## 2014-04-23 DIAGNOSIS — O34219 Maternal care for unspecified type scar from previous cesarean delivery: Secondary | ICD-10-CM

## 2014-04-23 DIAGNOSIS — Z3480 Encounter for supervision of other normal pregnancy, unspecified trimester: Secondary | ICD-10-CM

## 2014-04-23 LAB — POCT URINALYSIS DIP (DEVICE)
Bilirubin Urine: NEGATIVE
Glucose, UA: NEGATIVE mg/dL
HGB URINE DIPSTICK: NEGATIVE
Ketones, ur: NEGATIVE mg/dL
NITRITE: NEGATIVE
PH: 7 (ref 5.0–8.0)
PROTEIN: 30 mg/dL — AB
Specific Gravity, Urine: 1.025 (ref 1.005–1.030)
Urobilinogen, UA: 0.2 mg/dL (ref 0.0–1.0)

## 2014-04-23 NOTE — Progress Notes (Signed)
C/o of occasional lower back pain and pelvic pressure.

## 2014-04-23 NOTE — Progress Notes (Signed)
Reports GERD, recommended OTC Tums, Pepcid Discussed expected pelvic pain and back pain in pregnancy; PTL precautions and fetal precautions reviewed

## 2014-04-23 NOTE — Patient Instructions (Signed)
Return to clinic for any obstetric concerns or go to [facility] for evaluation  

## 2014-05-09 ENCOUNTER — Ambulatory Visit (INDEPENDENT_AMBULATORY_CARE_PROVIDER_SITE_OTHER): Payer: Self-pay | Admitting: Family Medicine

## 2014-05-09 VITALS — BP 102/62 | HR 98 | Temp 98.1°F | Wt 134.5 lb

## 2014-05-09 DIAGNOSIS — Z3480 Encounter for supervision of other normal pregnancy, unspecified trimester: Secondary | ICD-10-CM

## 2014-05-09 DIAGNOSIS — O34219 Maternal care for unspecified type scar from previous cesarean delivery: Secondary | ICD-10-CM

## 2014-05-09 DIAGNOSIS — Z3490 Encounter for supervision of normal pregnancy, unspecified, unspecified trimester: Secondary | ICD-10-CM

## 2014-05-09 LAB — POCT URINALYSIS DIP (DEVICE)
Bilirubin Urine: NEGATIVE
Glucose, UA: NEGATIVE mg/dL
Ketones, ur: NEGATIVE mg/dL
LEUKOCYTES UA: NEGATIVE
NITRITE: NEGATIVE
PH: 6.5 (ref 5.0–8.0)
PROTEIN: NEGATIVE mg/dL
Specific Gravity, Urine: 1.025 (ref 1.005–1.030)
UROBILINOGEN UA: 0.2 mg/dL (ref 0.0–1.0)

## 2014-05-09 NOTE — Patient Instructions (Signed)
Third Trimester of Pregnancy  The third trimester is from week 29 through week 42, months 7 through 9. The third trimester is a time when the fetus is growing rapidly. At the end of the ninth month, the fetus is about 20 inches in length and weighs 6 10 pounds.   BODY CHANGES  Your body goes through many changes during pregnancy. The changes vary from woman to woman.    Your weight will continue to increase. You can expect to gain 25 35 pounds (11 16 kg) by the end of the pregnancy.   You may begin to get stretch marks on your hips, abdomen, and breasts.   You may urinate more often because the fetus is moving lower into your pelvis and pressing on your bladder.   You may develop or continue to have heartburn as a result of your pregnancy.   You may develop constipation because certain hormones are causing the muscles that push waste through your intestines to slow down.   You may develop hemorrhoids or swollen, bulging veins (varicose veins).   You may have pelvic pain because of the weight gain and pregnancy hormones relaxing your joints between the bones in your pelvis. Back aches may result from over exertion of the muscles supporting your posture.   Your breasts will continue to grow and be tender. A yellow discharge may leak from your breasts called colostrum.   Your belly button may stick out.   You may feel short of breath because of your expanding uterus.   You may notice the fetus "dropping," or moving lower in your abdomen.   You may have a bloody mucus discharge. This usually occurs a few days to a week before labor begins.   Your cervix becomes thin and soft (effaced) near your due date.  WHAT TO EXPECT AT YOUR PRENATAL EXAMS   You will have prenatal exams every 2 weeks until week 36. Then, you will have weekly prenatal exams. During a routine prenatal visit:   You will be weighed to make sure you and the fetus are growing normally.   Your blood pressure is taken.   Your abdomen will be  measured to track your baby's growth.   The fetal heartbeat will be listened to.   Any test results from the previous visit will be discussed.   You may have a cervical check near your due date to see if you have effaced.  At around 36 weeks, your caregiver will check your cervix. At the same time, your caregiver will also perform a test on the secretions of the vaginal tissue. This test is to determine if a type of bacteria, Group B streptococcus, is present. Your caregiver will explain this further.  Your caregiver may ask you:   What your birth plan is.   How you are feeling.   If you are feeling the baby move.   If you have had any abnormal symptoms, such as leaking fluid, bleeding, severe headaches, or abdominal cramping.   If you have any questions.  Other tests or screenings that may be performed during your third trimester include:   Blood tests that check for low iron levels (anemia).   Fetal testing to check the health, activity level, and growth of the fetus. Testing is done if you have certain medical conditions or if there are problems during the pregnancy.  FALSE LABOR  You may feel small, irregular contractions that eventually go away. These are called Braxton Hicks contractions, or   false labor. Contractions may last for hours, days, or even weeks before true labor sets in. If contractions come at regular intervals, intensify, or become painful, it is best to be seen by your caregiver.   SIGNS OF LABOR    Menstrual-like cramps.   Contractions that are 5 minutes apart or less.   Contractions that start on the top of the uterus and spread down to the lower abdomen and back.   A sense of increased pelvic pressure or back pain.   A watery or bloody mucus discharge that comes from the vagina.  If you have any of these signs before the 37th week of pregnancy, call your caregiver right away. You need to go to the hospital to get checked immediately.  HOME CARE INSTRUCTIONS    Avoid all  smoking, herbs, alcohol, and unprescribed drugs. These chemicals affect the formation and growth of the baby.   Follow your caregiver's instructions regarding medicine use. There are medicines that are either safe or unsafe to take during pregnancy.   Exercise only as directed by your caregiver. Experiencing uterine cramps is a good sign to stop exercising.   Continue to eat regular, healthy meals.   Wear a good support bra for breast tenderness.   Do not use hot tubs, steam rooms, or saunas.   Wear your seat belt at all times when driving.   Avoid raw meat, uncooked cheese, cat litter boxes, and soil used by cats. These carry germs that can cause birth defects in the baby.   Take your prenatal vitamins.   Try taking a stool softener (if your caregiver approves) if you develop constipation. Eat more high-fiber foods, such as fresh vegetables or fruit and whole grains. Drink plenty of fluids to keep your urine clear or pale yellow.   Take warm sitz baths to soothe any pain or discomfort caused by hemorrhoids. Use hemorrhoid cream if your caregiver approves.   If you develop varicose veins, wear support hose. Elevate your feet for 15 minutes, 3 4 times a day. Limit salt in your diet.   Avoid heavy lifting, wear low heal shoes, and practice good posture.   Rest a lot with your legs elevated if you have leg cramps or low back pain.   Visit your dentist if you have not gone during your pregnancy. Use a soft toothbrush to brush your teeth and be gentle when you floss.   A sexual relationship may be continued unless your caregiver directs you otherwise.   Do not travel far distances unless it is absolutely necessary and only with the approval of your caregiver.   Take prenatal classes to understand, practice, and ask questions about the labor and delivery.   Make a trial run to the hospital.   Pack your hospital bag.   Prepare the baby's nursery.   Continue to go to all your prenatal visits as directed  by your caregiver.  SEEK MEDICAL CARE IF:   You are unsure if you are in labor or if your water has broken.   You have dizziness.   You have mild pelvic cramps, pelvic pressure, or nagging pain in your abdominal area.   You have persistent nausea, vomiting, or diarrhea.   You have a bad smelling vaginal discharge.   You have pain with urination.  SEEK IMMEDIATE MEDICAL CARE IF:    You have a fever.   You are leaking fluid from your vagina.   You have spotting or bleeding from your vagina.     You have severe abdominal cramping or pain.   You have rapid weight loss or gain.   You have shortness of breath with chest pain.   You notice sudden or extreme swelling of your face, hands, ankles, feet, or legs.   You have not felt your baby move in over an hour.   You have severe headaches that do not go away with medicine.   You have vision changes.  Document Released: 12/07/2001 Document Revised: 08/15/2013 Document Reviewed: 02/13/2013  [employer] Patient Information 2014 [employer].

## 2014-05-09 NOTE — Progress Notes (Signed)
Patient reports a lot of pelvic pressure and pain in upper thighs/groin

## 2014-05-09 NOTE — Progress Notes (Signed)
S: 28 yo G3P0111 @ 112w4d here for robv.  - dong well - having a lot of pelvic pressure and thigh pain - no ctx, lof, vb. +FM.   O: see flowsheet  A/P - doing well - labor precuations discussed.  - pelvic pain likely normal pregnancy

## 2014-05-21 ENCOUNTER — Ambulatory Visit (INDEPENDENT_AMBULATORY_CARE_PROVIDER_SITE_OTHER): Payer: Self-pay | Admitting: Obstetrics & Gynecology

## 2014-05-21 ENCOUNTER — Encounter: Payer: Self-pay | Admitting: Obstetrics & Gynecology

## 2014-05-21 VITALS — BP 97/63 | HR 100 | Temp 99.2°F | Wt 140.3 lb

## 2014-05-21 DIAGNOSIS — Z3480 Encounter for supervision of other normal pregnancy, unspecified trimester: Secondary | ICD-10-CM

## 2014-05-21 DIAGNOSIS — Z3490 Encounter for supervision of normal pregnancy, unspecified, unspecified trimester: Secondary | ICD-10-CM

## 2014-05-21 DIAGNOSIS — O34219 Maternal care for unspecified type scar from previous cesarean delivery: Secondary | ICD-10-CM

## 2014-05-21 LAB — POCT URINALYSIS DIP (DEVICE)
Bilirubin Urine: NEGATIVE
Glucose, UA: NEGATIVE mg/dL
HGB URINE DIPSTICK: NEGATIVE
Ketones, ur: NEGATIVE mg/dL
Nitrite: NEGATIVE
PH: 7.5 (ref 5.0–8.0)
PROTEIN: NEGATIVE mg/dL
SPECIFIC GRAVITY, URINE: 1.02 (ref 1.005–1.030)
UROBILINOGEN UA: 0.2 mg/dL (ref 0.0–1.0)

## 2014-05-21 LAB — OB RESULTS CONSOLE GC/CHLAMYDIA
Chlamydia: NEGATIVE
Gonorrhea: NEGATIVE

## 2014-05-21 LAB — OB RESULTS CONSOLE GBS: GBS: NEGATIVE

## 2014-05-21 NOTE — Progress Notes (Signed)
GBS and cx done today Reflux sx controlled with Pepcid OTC F/u 1 week or sooner prn Labor precautions reviewed

## 2014-05-21 NOTE — Patient Instructions (Addendum)
Natural Childbirth Natural childbirth is going through labor and delivery without any drugs to relieve pain. You also do not use fetal monitors, have a cesarean delivery, or get a sugical cut to enlarge the vaginal opening (episiotomy). With the help of a birthing professional (midwife), you will direct your own labor and delivery as you choose. Many women chose natural childbirth because they feel more in control and in touch with their labor and delivery. They are also concerned about the medications affecting themselves and the baby. Pregnant women with a high risk pregnancy should not attempt natural childbirth. It is better to deliver the infant in a hospital if an emergency situation arises. Sometimes, the caregiver has to intervene for the health and safety of the mother and infant. TWO TECHNIQUES FOR NATURAL CHILDBIRTH:   The Lamaze method. This method teaches women that having a baby is normal, healthy, and natural. It also teaches the mother to take a neutral position regarding pain medication and anesthesia and to make an informed decision if and when it is right for them.  The Bradley method (also called husband coached birth). This method teaches the father to be the birth coach and stresses a natural approach. It also encourages exercise and a balanced diet with good nutrition. The exercises teach relaxation and deep breathing techniques. However, there are also classes to prepare the parents for an emergency situation that may occur. METHODS OF DEALING WITH LABOR PAIN AND DELIVERY:  Meditation.  Yoga.  Hypnosis.  Acupuncture.  Massage.  Changing positions (walking, rocking, showering, leaning on birth balls).  Lying in warm water or a jacuzzi.  Find an activity that keeps your mind off of the labor pain.  Listen to soft music.  Visual imagery (focus on a particular object). BEFORE GOING INTO LABOR  Be sure you and your spouse/partner are in agreement to have natural  childbirth.  Decide if your caregiver or a midwife will deliver your baby.  Decide if you will have your baby in the hospital, birthing center, or at home.  If you have children, make plans to have someone to take care of them when you go to the hospital.  Know the distance and the time it takes to go to the delivery center. Make a dry run to be sure.  Have a bag packed with a night gown, bathrobe, and toiletries ready to take when you go into labor.  Keep phone numbers of your family and friends handy if you need to call someone when you go into labor.  Your spouse or partner should go to all the teaching classes.  Talk with your caregiver about the possibility of a medical emergency and what will happen if that occurs. ADVANTAGES OF NATURAL CHILDBIRTH  You are in control of your labor and delivery.  It is safe.  There are no medications or anesthetics that may affect you and the fetus.  There are no invasive procedures such as an episiotomy.  You and your partner will work together, which can increase your bond.  Meditation, yoga, massage, and breathing exercises can be learned while pregnant and help you when you are in labor and at delivery.  In most delivery centers, the family and friends can be involved in the labor and delivery process. DISADVANTAGES OF NATURAL CHILDBIRTH  You will experience pain during your labor and delivery.  The methods of helping relieve your labor pains may not work for you.  You may feel embarrassed, disappointed, and like a failure   if you decide to change your mind during labor and not have natural childbirth. AFTER THE DELIVERY  You will be very tired.  You will be uncomfortable because of your uterus contracting. You will feel soreness around the vagina.  You may feel cold and shaky.This is a natural reaction.  You will be excited, overwhelmed, accomplished, and proud to be a mother. HOME CARE INSTRUCTIONS   Follow the advice and  instructions of your caregiver.  Follow the instructions of your natural childbirth instructor (Lamaze or Bradley Method). Document Released: 11/25/2008 Document Revised: 03/06/2012 Document Reviewed: 08/20/2013 [facility] Patient Information 2014 [place], [place]. Group B Streptococcus Infection During Pregnancy Group B streptococcus (GBS) is a type of bacteria often found in healthy women. GBS usually does not cause any symptoms or harm to healthy adult women, but the bacteria can make a baby very sick if it is passed to the baby during childbirth. GBS is not a sexually transmitted disease (STD). GBS is different from Group A streptococcus, the bacteria that causes "strep throat." CAUSES  GBS bacteria can be found in the intestinal, reproductive, and urinary tracts of women. It can also be found in the female genital tract, most often in the vagina and rectal areas.  SYMPTOMS  In pregnancy, GBS can be in the following places:  Genital tract without symptoms.  Rectum without symptoms.  Urine with or without symptoms (asymptomatic bacteriuria).  Urinary symptoms can include pain, frequency, urgency, and blood with urination (cystitis). Pregnant women who are infected with GBS are at increased risk of:  Early (premature) labor and delivery.  Prolonged rupture of the membranes.  Infection in the following places:  Bladder.  Kidneys (pyelonephritis).  Bag of waters or placenta (chorioamnionitis).  Uterus (endometritis) after delivery. Newborns who are infected with GBS can develop:  Lung infection (pneumonia).  Blood infection (septicemia).  Infection of the lining of the brain and spinal cord (meningitis). DIAGNOSIS  Diagnosis of GBS infection is made by screening tests done when you are 35 to [redacted] weeks pregnant. The test (culture) is an easy swab of the vagina and rectum. A sample of your urine might also be checked for the bacteria. Talk with your caregiver about a plan for  labor if your test shows that you carry the GBS bacteria. TREATMENT  If results of the culture are positive, showing that GBS is present, you likely will receive treatment with antibiotic medicines during labor. This will help prevent GBS from being passed to your baby. The antibiotics work only if they are given during labor. If treatment is given earlier in pregnancy, the bacteria may regrow and be present during labor. Tell your caregiver if you are allergic to penicillin or other antibiotics. Antibiotics are also given if:   Infection of the membranes (amnionitis) is suspected.  Labor begins or there is rupture of the membranes before 37 weeks of pregnancy and there is a high risk of delivering the baby.  The mother has a past history of giving birth to an infant with GBS infection.  The culture status is unknown (culture not performed or result not available) and there is:  Fever during labor.  Preterm labor (less than 37 weeks of pregnancy).  Prolonged rupture of membranes (18 hours or more). Treatment of the mother during labor is not recommended when:  A planned cesarean delivery is done and there is no labor or ruptured membranes. This is true even if the mother tested positive for GBS.  There is a negative  culture for GBS screening during the pregnancy, regardless of the risk factors during labor. The infant will receive antibiotics if the infant tested positive for GBS or has signs and symptoms that suggest GBS infection is present. It is not recommended to routinely give antibiotics to infants whose mothers received antibiotic treatment during labor. HOME CARE INSTRUCTIONS   Take all antibiotics as prescribed by your caregiver.  Only take medicine as directed by your caregiver.  Continue with prenatal visits and care.  Return for follow-up appointments and cultures.  Follow your caregiver's instructions. SEEK MEDICAL CARE IF:   You have pain with urination.  You have  frequent urination.  You have blood in your urine. SEEK IMMEDIATE MEDICAL CARE IF:  You have a fever.  You have pain in the back, side, or uterus.  You have chills.  You have abdominal swelling (distension) or pain.  You have labor pains (contractions) every 10 minutes or more often.  You are leaking fluid or bleeding from your vagina.  You have pelvic pressure that feels like your baby is pushing down.  You have a low, dull backache.  You have cramps that feel like your period.  You have abdominal cramps with or without diarrhea.  You have repeated vomiting and diarrhea.  You have trouble breathing.  You develop confusion.  You have stiffness of your body or neck. MAKE SURE YOU:   Understand these instructions.  Will watch your condition.  Will get help right away if you are not doing well or get worse. Document Released: 03/21/2008 Document Revised: 03/06/2012 Document Reviewed: 04/25/2011 [facility] Patient Information 2014 [place], [place].

## 2014-05-22 LAB — GC/CHLAMYDIA PROBE AMP
CT Probe RNA: NEGATIVE
GC Probe RNA: NEGATIVE

## 2014-05-23 ENCOUNTER — Encounter: Payer: Self-pay | Admitting: Obstetrics & Gynecology

## 2014-05-23 LAB — CULTURE, BETA STREP (GROUP B ONLY)

## 2014-05-29 ENCOUNTER — Ambulatory Visit (INDEPENDENT_AMBULATORY_CARE_PROVIDER_SITE_OTHER): Payer: Medicaid Other | Admitting: Family Medicine

## 2014-05-29 ENCOUNTER — Encounter: Payer: Self-pay | Admitting: Family Medicine

## 2014-05-29 VITALS — BP 110/64 | HR 82 | Temp 97.4°F | Wt 141.6 lb

## 2014-05-29 DIAGNOSIS — Z3490 Encounter for supervision of normal pregnancy, unspecified, unspecified trimester: Secondary | ICD-10-CM

## 2014-05-29 DIAGNOSIS — Z3480 Encounter for supervision of other normal pregnancy, unspecified trimester: Secondary | ICD-10-CM

## 2014-05-29 LAB — POCT URINALYSIS DIP (DEVICE)
BILIRUBIN URINE: NEGATIVE
Glucose, UA: NEGATIVE mg/dL
Hgb urine dipstick: NEGATIVE
KETONES UR: NEGATIVE mg/dL
LEUKOCYTES UA: NEGATIVE
Nitrite: NEGATIVE
PH: 7 (ref 5.0–8.0)
Protein, ur: NEGATIVE mg/dL
Specific Gravity, Urine: 1.015 (ref 1.005–1.030)
Urobilinogen, UA: 0.2 mg/dL (ref 0.0–1.0)

## 2014-05-29 NOTE — Progress Notes (Signed)
+  FM, no LOF, no vb, rare ctx. Loss mucous plug No other complaitns  [name] is a 28 y.o. [id number] at [redacted]w[redacted]d by R=10 here for ROB visit. Negative (05/26 0000)  Discussed with Patient:  - Plans to breast/bottle feed.  All questions answered. - Continue prenatal vitamins. - Reviewed fetal kick counts Pt to perform daily at a time when the baby is active, lie laterally with both hands on belly in quiet room and count all movements (hiccups, shoulder rolls, obvious kicks, etc); pt is to report to clinic MAU for less than 10 movements felt in a 2 hour time period-pt told as soon as she counts 10 movements the count is complete.  - Routine precautions discussed (depression, infection s/s).   Patient provided with all pertinent phone numbers for emergencies. - RTC for any VB, regular, painful cramps/ctxs occurring at a rate of >2/10 min, fever (100.5 or higher), n/v/d, any pain that is unresolving or worsening, LOF, decreased fetal movement, CP, SOB, edema -RTC in one week for next visit.  Problems: Patient Active Problem List   Diagnosis Date Noted  . Previous cesarean section complicating pregnancy, antepartum 01/31/2014  . Supervision of normal pregnancy 11/08/2013  . Anemia 04/19/2013  . Abdominal pain, epigastric 04/17/2013  . Hematemesis- mild 04/04/2013  . Intractable nausea, vomiting & abdominal pain. 04/04/2013  . Dehydration 04/04/2013  . Hypokalemia 04/04/2013  . Tobacco abuse 04/04/2013  . Alcohol dependence 04/04/2013  . Metabolic acidosis, increased anion gap 04/04/2013    To Do: 1.   [ ]  Vaccines:recd [x ] BCM: mirena [ ]  Readiness: baby has a place to sleep, car seat, other baby necessities.  Edu: [ x] PTL precautions; [ ]  BF class; [ ]  childbirth class; [ ]   BF counseling;

## 2014-05-29 NOTE — Patient Instructions (Signed)
Third Trimester of Pregnancy  The third trimester is from week 29 through week 42, months 7 through 9. The third trimester is a time when the fetus is growing rapidly. At the end of the ninth month, the fetus is about 20 inches in length and weighs 6 10 pounds.   BODY CHANGES  Your body goes through many changes during pregnancy. The changes vary from woman to woman.    Your weight will continue to increase. You can expect to gain 25 35 pounds (11 16 kg) by the end of the pregnancy.   You may begin to get stretch marks on your hips, abdomen, and breasts.   You may urinate more often because the fetus is moving lower into your pelvis and pressing on your bladder.   You may develop or continue to have heartburn as a result of your pregnancy.   You may develop constipation because certain hormones are causing the muscles that push waste through your intestines to slow down.   You may develop hemorrhoids or swollen, bulging veins (varicose veins).   You may have pelvic pain because of the weight gain and pregnancy hormones relaxing your joints between the bones in your pelvis. Back aches may result from over exertion of the muscles supporting your posture.   Your breasts will continue to grow and be tender. A yellow discharge may leak from your breasts called colostrum.   Your belly button may stick out.   You may feel short of breath because of your expanding uterus.   You may notice the fetus "dropping," or moving lower in your abdomen.   You may have a bloody mucus discharge. This usually occurs a few days to a week before labor begins.   Your cervix becomes thin and soft (effaced) near your due date.  WHAT TO EXPECT AT YOUR PRENATAL EXAMS   You will have prenatal exams every 2 weeks until week 36. Then, you will have weekly prenatal exams. During a routine prenatal visit:   You will be weighed to make sure you and the fetus are growing normally.   Your blood pressure is taken.   Your abdomen will be  measured to track your baby's growth.   The fetal heartbeat will be listened to.   Any test results from the previous visit will be discussed.   You may have a cervical check near your due date to see if you have effaced.  At around 36 weeks, your caregiver will check your cervix. At the same time, your caregiver will also perform a test on the secretions of the vaginal tissue. This test is to determine if a type of bacteria, Group B streptococcus, is present. Your caregiver will explain this further.  Your caregiver may ask you:   What your birth plan is.   How you are feeling.   If you are feeling the baby move.   If you have had any abnormal symptoms, such as leaking fluid, bleeding, severe headaches, or abdominal cramping.   If you have any questions.  Other tests or screenings that may be performed during your third trimester include:   Blood tests that check for low iron levels (anemia).   Fetal testing to check the health, activity level, and growth of the fetus. Testing is done if you have certain medical conditions or if there are problems during the pregnancy.  FALSE LABOR  You may feel small, irregular contractions that eventually go away. These are called Braxton Hicks contractions, or   false labor. Contractions may last for hours, days, or even weeks before true labor sets in. If contractions come at regular intervals, intensify, or become painful, it is best to be seen by your caregiver.   SIGNS OF LABOR    Menstrual-like cramps.   Contractions that are 5 minutes apart or less.   Contractions that start on the top of the uterus and spread down to the lower abdomen and back.   A sense of increased pelvic pressure or back pain.   A watery or bloody mucus discharge that comes from the vagina.  If you have any of these signs before the 37th week of pregnancy, call your caregiver right away. You need to go to the hospital to get checked immediately.  HOME CARE INSTRUCTIONS    Avoid all  smoking, herbs, alcohol, and unprescribed drugs. These chemicals affect the formation and growth of the baby.   Follow your caregiver's instructions regarding medicine use. There are medicines that are either safe or unsafe to take during pregnancy.   Exercise only as directed by your caregiver. Experiencing uterine cramps is a good sign to stop exercising.   Continue to eat regular, healthy meals.   Wear a good support bra for breast tenderness.   Do not use hot tubs, steam rooms, or saunas.   Wear your seat belt at all times when driving.   Avoid raw meat, uncooked cheese, cat litter boxes, and soil used by cats. These carry germs that can cause birth defects in the baby.   Take your prenatal vitamins.   Try taking a stool softener (if your caregiver approves) if you develop constipation. Eat more high-fiber foods, such as fresh vegetables or fruit and whole grains. Drink plenty of fluids to keep your urine clear or pale yellow.   Take warm sitz baths to soothe any pain or discomfort caused by hemorrhoids. Use hemorrhoid cream if your caregiver approves.   If you develop varicose veins, wear support hose. Elevate your feet for 15 minutes, 3 4 times a day. Limit salt in your diet.   Avoid heavy lifting, wear low heal shoes, and practice good posture.   Rest a lot with your legs elevated if you have leg cramps or low back pain.   Visit your dentist if you have not gone during your pregnancy. Use a soft toothbrush to brush your teeth and be gentle when you floss.   A sexual relationship may be continued unless your caregiver directs you otherwise.   Do not travel far distances unless it is absolutely necessary and only with the approval of your caregiver.   Take prenatal classes to understand, practice, and ask questions about the labor and delivery.   Make a trial run to the hospital.   Pack your hospital bag.   Prepare the baby's nursery.   Continue to go to all your prenatal visits as directed  by your caregiver.  SEEK MEDICAL CARE IF:   You are unsure if you are in labor or if your water has broken.   You have dizziness.   You have mild pelvic cramps, pelvic pressure, or nagging pain in your abdominal area.   You have persistent nausea, vomiting, or diarrhea.   You have a bad smelling vaginal discharge.   You have pain with urination.  SEEK IMMEDIATE MEDICAL CARE IF:    You have a fever.   You are leaking fluid from your vagina.   You have spotting or bleeding from your vagina.     You have severe abdominal cramping or pain.   You have rapid weight loss or gain.   You have shortness of breath with chest pain.   You notice sudden or extreme swelling of your face, hands, ankles, feet, or legs.   You have not felt your baby move in over an hour.   You have severe headaches that do not go away with medicine.   You have vision changes.  Document Released: 12/07/2001 Document Revised: 08/15/2013 Document Reviewed: 02/13/2013  [employer] Patient Information 2014 [employer].

## 2014-06-05 ENCOUNTER — Ambulatory Visit (INDEPENDENT_AMBULATORY_CARE_PROVIDER_SITE_OTHER): Payer: Medicaid Other | Admitting: Family

## 2014-06-05 VITALS — BP 102/66 | HR 77 | Wt 136.4 lb

## 2014-06-05 DIAGNOSIS — O34219 Maternal care for unspecified type scar from previous cesarean delivery: Secondary | ICD-10-CM

## 2014-06-05 LAB — POCT URINALYSIS DIP (DEVICE)
Glucose, UA: NEGATIVE mg/dL
Hgb urine dipstick: NEGATIVE
KETONES UR: 40 mg/dL — AB
Nitrite: NEGATIVE
Protein, ur: 30 mg/dL — AB
Urobilinogen, UA: 1 mg/dL (ref 0.0–1.0)
pH: 6 (ref 5.0–8.0)

## 2014-06-05 MED ORDER — NITROFURANTOIN MONOHYD MACRO 100 MG PO CAPS: 100.0000 mg | ORAL_CAPSULE | Freq: Two times a day (BID) | ORAL | Status: DC

## 2014-06-05 NOTE — Progress Notes (Signed)
Has pelvic pressure and back pain. Increasing contractions.  Patient reports diarrhea and vomiting, not able to eat much.

## 2014-06-05 NOTE — Progress Notes (Signed)
Denies vomiting today.  Pt informed to notify us if symptoms do not resolve or worsens > concern for dehydration.  Labor precautions.  UA mod leuks > RX Macrobid and urine culture sent.   Pt aware of prescription at pharmacy

## 2014-06-05 NOTE — Progress Notes (Signed)
On Sunday noticed development of diarrhea and emesis (non bloody non bilious); had some difficulty keeping things down  No VB, no lof, increased nml vaginal discharge  Occasional contractions approx a few seconds to a 1-2 min duration, they occur sporadically throughout the day (started on Sunday)  Sx having been slowing down, appear to likely be related to viral gastro Encouraged BRAT diet and increased daily fluids  Given return precautions

## 2014-06-07 LAB — CULTURE, OB URINE

## 2014-06-09 ENCOUNTER — Inpatient Hospital Stay (HOSPITAL_COMMUNITY): Payer: Medicaid Other | Admitting: Anesthesiology

## 2014-06-09 ENCOUNTER — Encounter (HOSPITAL_COMMUNITY): Payer: Medicaid Other | Admitting: Anesthesiology

## 2014-06-09 ENCOUNTER — Inpatient Hospital Stay (HOSPITAL_COMMUNITY)
Admission: AD | Admit: 2014-06-09 | Discharge: 2014-06-10 | DRG: 775 | Disposition: A | Discharge status: Home / Self Care | Payer: Medicaid Other | Source: Ambulatory Visit | Attending: Family Medicine | Admitting: Family Medicine

## 2014-06-09 ENCOUNTER — Encounter (HOSPITAL_COMMUNITY): Payer: Self-pay | Admitting: *Deleted

## 2014-06-09 DIAGNOSIS — Z8249 Family history of ischemic heart disease and other diseases of the circulatory system: Secondary | ICD-10-CM

## 2014-06-09 DIAGNOSIS — F1910 Other psychoactive substance abuse, uncomplicated: Secondary | ICD-10-CM | POA: Diagnosis present

## 2014-06-09 DIAGNOSIS — IMO0001 Reserved for inherently not codable concepts without codable children: Secondary | ICD-10-CM

## 2014-06-09 DIAGNOSIS — O99354 Diseases of the nervous system complicating childbirth: Secondary | ICD-10-CM | POA: Diagnosis present

## 2014-06-09 DIAGNOSIS — Z87891 Personal history of nicotine dependence: Secondary | ICD-10-CM

## 2014-06-09 DIAGNOSIS — Z833 Family history of diabetes mellitus: Secondary | ICD-10-CM

## 2014-06-09 DIAGNOSIS — O34219 Maternal care for unspecified type scar from previous cesarean delivery: Secondary | ICD-10-CM | POA: Diagnosis not present

## 2014-06-09 DIAGNOSIS — O99344 Other mental disorders complicating childbirth: Secondary | ICD-10-CM | POA: Diagnosis present

## 2014-06-09 DIAGNOSIS — G40909 Epilepsy, unspecified, not intractable, without status epilepticus: Secondary | ICD-10-CM | POA: Diagnosis not present

## 2014-06-09 LAB — TYPE AND SCREEN
ABO/RH(D): O POS
ANTIBODY SCREEN: NEGATIVE

## 2014-06-09 LAB — CBC
HEMATOCRIT: 32.1 % — AB (ref 36.0–46.0)
HEMOGLOBIN: 10.8 g/dL — AB (ref 12.0–15.0)
MCH: 30.9 pg (ref 26.0–34.0)
MCHC: 33.6 g/dL (ref 30.0–36.0)
MCV: 92 fL (ref 78.0–100.0)
Platelets: 298 10*3/uL (ref 150–400)
RBC: 3.49 MIL/uL — ABNORMAL LOW (ref 3.87–5.11)
RDW: 13.9 % (ref 11.5–15.5)
WBC: 15.3 10*3/uL — ABNORMAL HIGH (ref 4.0–10.5)

## 2014-06-09 LAB — ABO/RH: ABO/RH(D): O POS

## 2014-06-09 LAB — RPR

## 2014-06-09 LAB — HEPATITIS B SURFACE ANTIGEN: Hepatitis B Surface Ag: NEGATIVE

## 2014-06-09 MED ORDER — FENTANYL CITRATE 0.05 MG/ML IJ SOLN
100.0000 ug | Freq: Once | INTRAMUSCULAR | Status: DC
  Filled 2014-06-09: qty 2

## 2014-06-09 MED ORDER — OXYCODONE-ACETAMINOPHEN 5-325 MG PO TABS: 1.0000 | ORAL_TABLET | ORAL | Status: DC | PRN

## 2014-06-09 MED ORDER — ONDANSETRON HCL 4 MG PO TABS: 4.0000 mg | ORAL_TABLET | ORAL | Status: DC | PRN

## 2014-06-09 MED ORDER — SIMETHICONE 80 MG PO CHEW: 80.0000 mg | CHEWABLE_TABLET | ORAL | Status: DC | PRN

## 2014-06-09 MED ORDER — LANOLIN HYDROUS EX OINT: TOPICAL_OINTMENT | CUTANEOUS | Status: DC | PRN

## 2014-06-09 MED ORDER — DIBUCAINE 1 % RE OINT: 1.0000 "application " | TOPICAL_OINTMENT | RECTAL | Status: DC | PRN

## 2014-06-09 MED ORDER — OXYTOCIN BOLUS FROM INFUSION: 500.0000 mL | INTRAVENOUS | Status: DC

## 2014-06-09 MED ORDER — PHENYLEPHRINE 40 MCG/ML (10ML) SYRINGE FOR IV PUSH (FOR BLOOD PRESSURE SUPPORT)
80.0000 ug | PREFILLED_SYRINGE | INTRAVENOUS | Status: DC | PRN
  Filled 2014-06-09: qty 2

## 2014-06-09 MED ORDER — ONDANSETRON HCL 4 MG/2ML IJ SOLN: 4.0000 mg | Freq: Four times a day (QID) | INTRAMUSCULAR | Status: DC | PRN

## 2014-06-09 MED ORDER — LIDOCAINE HCL (PF) 1 % IJ SOLN
30.0000 mL | INTRAMUSCULAR | Status: DC | PRN
  Filled 2014-06-09: qty 30

## 2014-06-09 MED ORDER — TETANUS-DIPHTH-ACELL PERTUSSIS 5-2.5-18.5 LF-MCG/0.5 IM SUSP: 0.5000 mL | Freq: Once | INTRAMUSCULAR | Status: DC

## 2014-06-09 MED ORDER — IBUPROFEN 600 MG PO TABS: 600.0000 mg | ORAL_TABLET | Freq: Four times a day (QID) | ORAL | Status: DC | PRN

## 2014-06-09 MED ORDER — FENTANYL CITRATE 0.05 MG/ML IJ SOLN
100.0000 ug | INTRAMUSCULAR | Status: DC | PRN
  Administered 2014-06-09: 100 ug via INTRAVENOUS

## 2014-06-09 MED ORDER — LACTATED RINGERS IV SOLN
500.0000 mL | INTRAVENOUS | Status: DC | PRN
  Administered 2014-06-09: 1000 mL via INTRAVENOUS
  Administered 2014-06-09: 500 mL via INTRAVENOUS

## 2014-06-09 MED ORDER — EPHEDRINE 5 MG/ML INJ
10.0000 mg | INTRAVENOUS | Status: DC | PRN
  Filled 2014-06-09: qty 4
  Filled 2014-06-09: qty 2

## 2014-06-09 MED ORDER — ZOLPIDEM TARTRATE 5 MG PO TABS: 5.0000 mg | ORAL_TABLET | Freq: Every evening | ORAL | Status: DC | PRN

## 2014-06-09 MED ORDER — SENNOSIDES-DOCUSATE SODIUM 8.6-50 MG PO TABS
2.0000 | ORAL_TABLET | ORAL | Status: DC
  Administered 2014-06-09: 2 via ORAL
  Filled 2014-06-09: qty 2

## 2014-06-09 MED ORDER — LIDOCAINE HCL (PF) 1 % IJ SOLN
INTRAMUSCULAR | Status: DC | PRN
  Administered 2014-06-09: 5 mL
  Administered 2014-06-09: 3 mL
  Administered 2014-06-09: 5 mL

## 2014-06-09 MED ORDER — IBUPROFEN 600 MG PO TABS
600.0000 mg | ORAL_TABLET | Freq: Four times a day (QID) | ORAL | Status: DC
  Administered 2014-06-09 – 2014-06-10 (×6): 600 mg via ORAL
  Filled 2014-06-09 (×5): qty 1

## 2014-06-09 MED ORDER — BENZOCAINE-MENTHOL 20-0.5 % EX AERO
1.0000 "application " | INHALATION_SPRAY | CUTANEOUS | Status: DC | PRN
  Administered 2014-06-09: 1 via TOPICAL
  Filled 2014-06-09: qty 56

## 2014-06-09 MED ORDER — MISOPROSTOL 200 MCG PO TABS
ORAL_TABLET | ORAL | Status: AC
  Administered 2014-06-09: 1000 ug via RECTAL
  Filled 2014-06-09: qty 5

## 2014-06-09 MED ORDER — LACTATED RINGERS IV SOLN: INTRAVENOUS | Status: DC

## 2014-06-09 MED ORDER — ACETAMINOPHEN 325 MG PO TABS
650.0000 mg | ORAL_TABLET | ORAL | Status: DC | PRN
  Administered 2014-06-09: 650 mg via ORAL
  Filled 2014-06-09: qty 2

## 2014-06-09 MED ORDER — FENTANYL 2.5 MCG/ML BUPIVACAINE 1/10 % EPIDURAL INFUSION (WH - ANES)
14.0000 mL/h | INTRAMUSCULAR | Status: DC | PRN
  Filled 2014-06-09: qty 125

## 2014-06-09 MED ORDER — FENTANYL 2.5 MCG/ML BUPIVACAINE 1/10 % EPIDURAL INFUSION (WH - ANES)
INTRAMUSCULAR | Status: DC | PRN
  Administered 2014-06-09: 14 mL/h via EPIDURAL

## 2014-06-09 MED ORDER — FLEET ENEMA 7-19 GM/118ML RE ENEM: 1.0000 | ENEMA | RECTAL | Status: DC | PRN

## 2014-06-09 MED ORDER — DIPHENHYDRAMINE HCL 25 MG PO CAPS: 25.0000 mg | ORAL_CAPSULE | Freq: Four times a day (QID) | ORAL | Status: DC | PRN

## 2014-06-09 MED ORDER — EPHEDRINE 5 MG/ML INJ
10.0000 mg | INTRAVENOUS | Status: DC | PRN
  Filled 2014-06-09: qty 2

## 2014-06-09 MED ORDER — LACTATED RINGERS IV SOLN: 500.0000 mL | Freq: Once | INTRAVENOUS | Status: DC

## 2014-06-09 MED ORDER — DIPHENHYDRAMINE HCL 50 MG/ML IJ SOLN: 12.5000 mg | INTRAMUSCULAR | Status: DC | PRN

## 2014-06-09 MED ORDER — WITCH HAZEL-GLYCERIN EX PADS: 1.0000 "application " | MEDICATED_PAD | CUTANEOUS | Status: DC | PRN

## 2014-06-09 MED ORDER — PHENYLEPHRINE 40 MCG/ML (10ML) SYRINGE FOR IV PUSH (FOR BLOOD PRESSURE SUPPORT)
80.0000 ug | PREFILLED_SYRINGE | INTRAVENOUS | Status: DC | PRN
  Filled 2014-06-09: qty 2
  Filled 2014-06-09: qty 10

## 2014-06-09 MED ORDER — MISOPROSTOL 200 MCG PO TABS
1000.0000 ug | ORAL_TABLET | Freq: Once | ORAL | Status: AC
  Administered 2014-06-09: 1000 ug via RECTAL

## 2014-06-09 MED ORDER — CITRIC ACID-SODIUM CITRATE 334-500 MG/5ML PO SOLN: 30.0000 mL | ORAL | Status: DC | PRN

## 2014-06-09 MED ORDER — OXYTOCIN 40 UNITS IN LACTATED RINGERS INFUSION - SIMPLE MED
62.5000 mL/h | INTRAVENOUS | Status: DC
  Administered 2014-06-09: 999 mL/h via INTRAVENOUS
  Filled 2014-06-09: qty 1000

## 2014-06-09 MED ORDER — ONDANSETRON HCL 4 MG/2ML IJ SOLN: 4.0000 mg | INTRAMUSCULAR | Status: DC | PRN

## 2014-06-09 MED ORDER — PRENATAL MULTIVITAMIN CH
1.0000 | ORAL_TABLET | Freq: Every day | ORAL | Status: DC
  Administered 2014-06-09 – 2014-06-10 (×2): 1 via ORAL
  Filled 2014-06-09: qty 1

## 2014-06-09 NOTE — Anesthesia Preprocedure Evaluation (Signed)
Anesthesia Evaluation  Patient identified by MRN, date of birth, ID band Patient awake    Reviewed: Allergy & Precautions, H&P , NPO status , Patient's Chart, lab work & pertinent test results  History of Anesthesia Complications Negative for: history of anesthetic complications  Airway Mallampati: II TM Distance: >3 FB Neck ROM: full    Dental no notable dental hx. (+) Teeth Intact   Pulmonary neg pulmonary ROS, former smoker,  breath sounds clear to auscultation  Pulmonary exam normal       Cardiovascular negative cardio ROS  Rhythm:regular Rate:Normal     Neuro/Psych negative neurological ROS  negative psych ROS   GI/Hepatic negative GI ROS, Neg liver ROS,   Endo/Other  negative endocrine ROS  Renal/GU negative Renal ROS  negative genitourinary   Musculoskeletal   Abdominal Normal abdominal exam  (+)   Peds  Hematology negative hematology ROS (+)   Anesthesia Other Findings Polysubstance abuse  Reproductive/Obstetrics (+) Pregnancy                           Anesthesia Physical Anesthesia Plan  ASA: II  Anesthesia Plan: Epidural   Post-op Pain Management:    Induction:   Airway Management Planned:   Additional Equipment:   Intra-op Plan:   Post-operative Plan:   Informed Consent: I have reviewed the patients History and Physical, chart, labs and discussed the procedure including the risks, benefits and alternatives for the proposed anesthesia with the patient or authorized representative who has indicated his/her understanding and acceptance.     Plan Discussed with:   Anesthesia Plan Comments:         Anesthesia Quick Evaluation

## 2014-06-09 NOTE — Progress Notes (Signed)
Pt vomited mod amt undigested food 

## 2014-06-09 NOTE — H&P (Signed)
`````  Attestation of Attending Supervision of Advanced Practitioner: Evaluation and management procedures were performed by the PA/NP/CNM/OB Fellow under my supervision/collaboration. Chart reviewed and agree with management and plan.  Tamey Wanek V 06/09/2014 4:58 PM

## 2014-06-09 NOTE — MAU Note (Signed)
Contractions since yesterday. Stronger since 0200. Some bloody show

## 2014-06-09 NOTE — Anesthesia Procedure Notes (Addendum)
Epidural Patient location during procedure: OB  Staffing Anesthesiologist: Phillips GroutARIGNAN, Sartaj Hoskin Performed by: anesthesiologist   Preanesthetic Checklist Completed: patient identified, site marked, surgical consent, pre-op evaluation, timeout performed, IV checked, risks and benefits discussed and monitors and equipment checked  Epidural Patient position: sitting Prep: ChloraPrep Patient monitoring: heart rate, continuous pulse ox and blood pressure Approach: right paramedian Location: L4-L5 Injection technique: LOR saline  Needle:  Needle type: Hustead  Needle gauge: 18 G Needle length: 9 cm and 9 Needle insertion depth: 6 cm Catheter type: closed end flexible Catheter size: 20 Guage Catheter at skin depth: 10 cm Test dose: negative and 1.5% lidocaine  Assessment Events: blood not aspirated, injection not painful, no injection resistance, negative IV test and no paresthesia  Additional Notes Test dose 1.5% Lidocaine with epi 1:200,000  Patient tolerated the insertion well without complications.

## 2014-06-09 NOTE — Progress Notes (Signed)
Dr Jordan LikesSchmitz notified of pt's admission and status. Aware of prior c/s, sve, hx drug abuse, contraction pattern, will see pt.

## 2014-06-09 NOTE — MAU Note (Signed)
Pt arrived via EMS to RM # 4 at 0455. Alert and oriented. Uncomfortable with ctxs. Helped to breathe and relax. SVE done

## 2014-06-09 NOTE — Progress Notes (Signed)
Acknowledged Social Work consult to assess mother's history of poly substance use and hx of homelessness.   Mother was very guarded.  She is a single parent with one other dependent age 159.  Informed that she has an apartment and her 27 year old lives with her.  Mother became irate when she was questioned about her hx with substance use stating that it was it the past and has nothing to do with her situation now.  She was also upset with the mention of her being homeless.  Address on face sheet is her mother's address and she did not provide the address to her apartment.   She denies any alcohol or illicit drug use during this pregnancy.  Informed that she completed a 45 day rehab treatment program in 2014.  The assessment was interrupted and upon this writer's return to complete the assessment, there was a room filled with people and mother asked for this writer to return after visiting hours.  Informed her that a CSW will follow up with her tomorrow.  UDS on newborn was negative.

## 2014-06-09 NOTE — Anesthesia Postprocedure Evaluation (Signed)
Anesthesia Post Note  Patient: [name]  Procedure(s) Performed: * No procedures listed *  Anesthesia type: Epidural  Patient location: Mother/Baby  Post pain: Pain level controlled  Post assessment: Post-op Vital signs reviewed  Last Vitals:  Filed Vitals:   06/09/14 1410  BP:   Pulse:   Temp: 37.5 C  Resp:     Post vital signs: Reviewed  Level of consciousness:alert  Complications: No apparent anesthesia complications

## 2014-06-09 NOTE — Progress Notes (Signed)
[facility] [place] via w/c.

## 2014-06-09 NOTE — Progress Notes (Signed)
Dr Schmitz in to see pt 

## 2014-06-09 NOTE — H&P (Signed)
LABOR ADMISSION HISTORY AND PHYSICAL  [name] is a 28 y.o. female [phone] with IUP at 5462w0d presenting for regular contractions.  She started having contractions earlier tonight. They have been getting progressively stronger and closer together.   She denies any LOF but did report a bloody show this morning but no continued bleeding.   Prior pregnancy was delivered c-section due to deceleration and being transverse. Patient would like a TOLAC.   [facility]  Prenatal History/Complications:  Past Medical History: Past Medical History  Diagnosis Date  . Stomach ulcer     never had any tests to confirm this diagni=oses  . Polysubstance abuse   . Adjustment disorder with emotional disturbance   . Metabolic acidosis 03/2013     hospitaliation due to alcohol abuse, dehyration, nausea/vomiting  . 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 Hematemesis 03/2013    along with alcoholic gastritis, hospitalization  . Pyelonephritis 03/2010    hospitalization  . Seizures     Past Surgical History: Past Surgical History  Procedure Laterality Date  . Breast lumpectomy Right     benign pathology  . Cesarean section  2005    Obstetrical History: OB History   Grav Para Term Preterm Abortions TAB SAB Ect Mult Living   3 1 0 1 1 1 0 0 0 1      Social History: History   Social History  . Marital Status: Single    Spouse Name: N/A    Number of Children: 1  . Years of Education: College   Occupational History  .  Other    [employer]   Social History Main Topics  . Smoking status: Former Smoker    Types: Cigarettes    Quit date: 11/26/2013  . Smokeless tobacco: Never Used  . Alcohol Use: No     Comment: binge drinking; quit Aug. 2014  . Drug Use: Yes    Special: Marijuana, Cocaine     Comment: quit Nov. 2014  . Sexual Activity: Yes   Other Topics Concern  . None   Social History Narrative   Homeless, lives with friends,  family, in hotels, random locations.  Mother is supportive.  No relationship with father.   Questionable relationship with siblings?  No significant other.  Unemployed as of 03/2013.   Patient lives with family.   Caffeine Use: none    Family History: Family History  Problem Relation Age of Onset  . Diabetes Mother   . Hypertension Father   . Diabetes Maternal Aunt   . Diabetes Maternal Uncle   . Cancer Maternal Uncle   . Diabetes Maternal Grandmother   . Cancer Maternal Grandmother   . Diabetes Maternal Grandfather     Allergies: No Known Allergies  Prescriptions prior to admission  Medication Sig Dispense Refill  . folic acid (FOLVITE) 1 MG tablet Take 1 tablet (1 mg total) by mouth daily.  30 tablet  10  . nitrofurantoin, macrocrystal-monohydrate, (MACROBID) 100 MG capsule Take 1 capsule (100 mg total) by mouth 2 (two) times daily.  14 capsule  0  . Prenatal Multivit-Min-Fe-FA (PRENATAL VITAMINS) 0.8 MG tablet Take 1 tablet by mouth daily.  30 tablet  12     Review of Systems   All systems reviewed and negative except as stated in HPI  Blood pressure 120/51, pulse 81, temperature 99.1 F (37.3 C), resp. rate 22, last menstrual period 08/24/2013. General appearance: alert, cooperative, moderate distress  RRR no mgt CTAB no wrc Abdomen: soft, non-tender; bowel sounds normal Extremities: Homans sign is negative, no sign of DVT Presentation: cephalic Fetal monitoringBaseline: 135 bpm, Variability: Good {> 6 bpm), Accelerations: Reactive and Decelerations: Absent Uterine activityFrequency: Every 1-2 minutes Dilation: 4 Effacement (%): 100 Station: -1 Exam by:: Quintella BatonJo Barham RNC   Prenatal labs: ABO, Rh:   Antibody:   Rubella:   RPR: NON REAC (03/31 0959)  HBsAg:    HIV: NON REACTIVE (03/31 0959)  GBS: Negative (05/26 0000)  1 hr Glucola 125 Genetic screening  1 Screen:  Normal  MSAFP Not done Anatomy US Normal except right vent echo focus > nml at 27  wks   Prenatal Transfer Tool  Maternal Diabetes: No Genetic Screening: Normal Maternal Ultrasounds/Referrals: Normal Fetal Ultrasounds or other Referrals:  None Maternal Substance Abuse:  Distant cocaine use but none  Significant Maternal Medications:  None Significant Maternal Lab Results: Lab values include: Group B Strep negative   [facility]  Dating Ultrasound: 10   weeks        Ultrasound consistent with LMP: No  Genetic Screen 1 Screen:  Normal  MSAFP Not done  Anatomic US Normal except right vent echo focus > nml at 27 wks  GTT  Third trimester: 125  TDaP vaccine 04/09/2014  Flu vaccine 10/29/13  GBS 05/21/2014: negative  Baby Food Breast  Contraception mirena  [facility] [place]  Support Person: Mother   Results for orders placed during the hospital encounter of 06/09/14 (from the past 24 hour(s))  CBC   Collection Time    06/09/14  5:45 AM      Result Value Ref Range   WBC 15.3 (*) 4.0 - 10.5 K/uL   RBC 3.49 (*) 3.87 - 5.11 MIL/uL   Hemoglobin 10.8 (*) 12.0 - 15.0 g/dL   HCT 16.132.1 (*) 09.636.0 - 04.546.0 %   MCV 92.0  78.0 - 100.0 fL   MCH 30.9  26.0 - 34.0 pg   MCHC 33.6  30.0 - 36.0 g/dL   RDW 40.913.9  81.111.5 - 91.415.5 %   Platelets 298  150 - 400 K/uL    Assessment: [name] is a 28 y.o. [id number] at 5638w0d here for labor.    #Labor: TOLAC consent, reviewed, progressing spontaneously  #Pain: Fentanyl PRN, Planning for Epidural  #FWB: Cat 1  #ID:  GBS neg  #MOF: breast  #MOC:mirena  #Circ:  Female   Myra RudeSchmitz, Jeremy E 06/09/2014, 6:07 AM  I spoke with and examined patient and agree with resident's note and plan of care.  Tawana ScaleMichael Ryan Marilee Ditommaso, MD OB Fellow 06/09/2014 6:13 AM

## 2014-06-10 MED ORDER — IBUPROFEN 600 MG PO TABS: 600.0000 mg | ORAL_TABLET | Freq: Four times a day (QID) | ORAL | Status: DC

## 2014-06-10 NOTE — Discharge Summary (Signed)
Obstetric Discharge Summary Reason for Admission: onset of labor Prenatal Procedures: ultrasound Intrapartum Procedures: spontaneous vaginal delivery Postpartum Procedures: none Complications-Operative and Postpartum: none Hemoglobin  Date Value Ref Range Status  06/09/2014 10.8* 12.0 - 15.0 g/dL Final  16/1/096011/02/[id number]   Final     HCT  Date Value Ref Range Status  06/09/2014 32.1* 36.0 - 46.0 % Final  10/29/2013 39   Final   [name] is a 28 y.o. female G3P0111 with IUP at 5732w0d presenting in active labor. At 8:53 AM (6/14) a viable female was delivered via VBAC, Spontaneous (Presentation: Left Occiput Anterior). APGAR: 8, 9.  Patient was visited by SW and was cleared for discharge. She is breastfeeding and considering the nexplanon or mirena for birth control.   Discharge Diagnoses: Term Pregnancy-delivered  Discharge Information: Date: 06/10/2014 Activity: unrestricted Diet: routine Medications: Ibuprofen Condition: stable Instructions: refer to practice specific booklet Discharge to: home Follow-up Information   Follow up with [facility]. Schedule an appointment as soon as possible for a visit in 4 weeks. (postpartum follow up)    Contact information:   [address] [place] [address]       Newborn Data: Live born female  Birth Weight: 6 lb 0.3 oz (2730 g) APGAR: 8, 8  Home with mother.  Myra RudeSchmitz, Jeremy E 06/10/2014, 4:50 PM  I have reviewed the documentation on this patient and agree with above in the resident's note. Pt was evaluated and examined by Sharen CounterLisa Leftwich-Kirby. Please see her note from earlier today.    Rulon AbideKeli Velvet Moomaw, M.D. [facility] 06/10/2014 5:19 PM

## 2014-06-10 NOTE — Discharge Instructions (Signed)

## 2014-06-10 NOTE — Progress Notes (Signed)
Post Partum Day 1 Subjective: no complaints, up ad lib, voiding and tolerating PO  Objective: Blood pressure 93/58, pulse 78, temperature 98.1 F (36.7 C), temperature source Oral, resp. rate 18, height 5\' 3"  (1.6 m), weight 63.504 kg (140 lb), last menstrual period 08/24/2013, SpO2 100.00%, unknown if currently breastfeeding.  Physical Exam:  General: alert, cooperative and no distress Lochia: appropriate Uterine Fundus: firm Incision: n/a DVT Evaluation: No evidence of DVT seen on physical exam. Negative Homan's sign. No cords or calf tenderness. No significant calf/ankle edema.   Recent Labs  06/09/14 0545  HGB 10.8*  HCT 32.1*    Assessment/Plan: Plan for discharge tomorrow, Breastfeeding and Contraception Nexplanon vs Mirena   LOS: 1 day   LEFTWICH-KIRBY, Artavia Jeanlouis 06/10/2014, 7:45 AM

## 2014-06-10 NOTE — Progress Notes (Signed)
Ur chart review completed.  

## 2014-06-10 NOTE — Lactation Note (Signed)
This note was copied from the chart of [name]. Lactation Consultation Note Mom holding baby, had all ready fed baby. Mom 1st child was in NICU and she pumped for a while then she ended up bottle feeding. Mom states she is doing ok now and didn't need any help at this time. Denies pain with feedings.  Patient Name: [name] Mom encouraged to feed baby 8-12 times/24 hours and with feeding cues. Mom encouraged to feed baby w/feeding cueseducated about newborn behavior. WH/LC brochure given w/resources, support groups and LC services.Encouraged to call for assistance if needed and to verify proper latch. Today's Date: 06/10/2014     Maternal Data    Feeding Feeding Type: Breast Fed Length of feed: 20 min  LATCH Score/Interventions                      Lactation Tools Discussed/Used     Consult Status      Hanalei Glace G 06/10/2014, 5:48 AM

## 2014-06-10 NOTE — Progress Notes (Addendum)
CSW attempted to complete safety assessment however pt was not willing to speak with this writer. Pt told CSW that she felt "offended" by the consult reason & does not wish to discuss her past. CSW was not able to gain additional information. Pt refused to provide CSW & Health Department worker with current address, as she declined routine visit from Health Department RN. Pt was previous CPS involvement but CSW does not have an open case at this time.  Drugs screens will continued to be monitored.      

## 2014-06-10 NOTE — Discharge Summary (Signed)
Attestation of Attending Supervision of Fellow: Evaluation and management procedures were performed by the Fellow under my supervision and collaboration.  I have reviewed the Fellow's note and chart, and I agree with the management and plan.    

## 2014-06-11 ENCOUNTER — Encounter: Payer: Medicaid Other | Admitting: Obstetrics and Gynecology

## 2014-06-17 ENCOUNTER — Encounter (HOSPITAL_COMMUNITY): Payer: Self-pay

## 2014-06-18 ENCOUNTER — Encounter: Payer: Medicaid Other | Admitting: Obstetrics & Gynecology

## 2014-07-24 ENCOUNTER — Encounter: Payer: Self-pay | Admitting: Advanced Practice Midwife

## 2014-07-24 ENCOUNTER — Ambulatory Visit (INDEPENDENT_AMBULATORY_CARE_PROVIDER_SITE_OTHER): Payer: Medicaid Other | Admitting: Advanced Practice Midwife

## 2014-07-24 VITALS — BP 111/68 | HR 49 | Temp 98.4°F | Resp 20 | Ht 62.0 in | Wt 119.6 lb

## 2014-07-24 DIAGNOSIS — IMO0001 Reserved for inherently not codable concepts without codable children: Secondary | ICD-10-CM

## 2014-07-24 DIAGNOSIS — M549 Dorsalgia, unspecified: Secondary | ICD-10-CM

## 2014-07-24 DIAGNOSIS — R1032 Left lower quadrant pain: Secondary | ICD-10-CM

## 2014-07-24 DIAGNOSIS — M7918 Myalgia, other site: Secondary | ICD-10-CM

## 2014-07-24 LAB — POCT URINALYSIS DIP (DEVICE)
BILIRUBIN URINE: NEGATIVE
Glucose, UA: NEGATIVE mg/dL
Ketones, ur: NEGATIVE mg/dL
NITRITE: NEGATIVE
Protein, ur: 100 mg/dL — AB
SPECIFIC GRAVITY, URINE: 1.02 (ref 1.005–1.030)
Urobilinogen, UA: 0.2 mg/dL (ref 0.0–1.0)
pH: 7 (ref 5.0–8.0)

## 2014-07-24 MED ORDER — NORETHINDRONE 0.35 MG PO TABS: 1.0000 | ORAL_TABLET | Freq: Every day | ORAL | Status: DC

## 2014-07-24 NOTE — Progress Notes (Signed)
Pt reports only 1 day of bleeding on 7/26 - ?period.  She also has been having pain on her Lt side x3 days.  Pt is undecided as to method of birth control- brochures given for Nexplanon and Mirena. She has had unprotected intercourse twice since birth of baby. Pt states she became pregnant while on Depo Provera previously.

## 2014-07-24 NOTE — Progress Notes (Signed)
  Subjective:     [name] is a 28 y.o. female who presents for a postpartum visit. She is 6 weeks postpartum following a spontaneous vaginal delivery. I have fully reviewed the prenatal and intrapartum course.  Outcome: spontaneous vaginal delivery.  Postpartum course has been normal. Baby's course has been normal. Baby is feeding by breast. Bleeding no bleeding. Bowel function is normal. Bladder function is normal. Patient is sexually active. Contraception method is none. Postpartum depression screening: negative.  The following portions of the patient's history were reviewed and updated as appropriate: allergies, current medications, past family history, past medical history, past social history, past surgical history and problem list.  Review of Systems A comprehensive review of systems was negative except left lower abdominal and left lower back pain.  Pt denies constipation or urinary symptoms.     Objective:    BP 111/68  Pulse 49  Temp(Src) 98.4 F (36.9 C) (Oral)  Resp 20  Ht 5\' 2"  (1.575 m)  Wt 119 lb 9.6 oz (54.25 kg)  BMI 21.87 kg/m2  LMP 07/21/2014  Breastfeeding? Yes  General:  alert, cooperative and no distress       Physical Examination: General appearance - alert, well appearing, and in no distress, oriented to person, place, and time and acyanotic, in no respiratory distress Abdomen - soft, nontender, nondistended, no masses or organomegaly Back exam - mild tenderness in left lower back and left flank, negative CVA tenderness   Assessment:     Normal postpartum exam.  Musculoskeletal pain. Pap smear not done at today's visit.   Plan:    1. Contraception: oral progesterone-only contraceptive. Discussed LARCs and pt given printed materials. 2. U/A and urine culture today r/t back pain 3. Follow up in: 1 year or as needed.

## 2014-07-27 LAB — URINE CULTURE: Colony Count: >=100000

## 2014-07-28 ENCOUNTER — Other Ambulatory Visit: Payer: Self-pay | Admitting: Advanced Practice Midwife

## 2014-07-28 MED ORDER — NITROFURANTOIN MONOHYD MACRO 100 MG PO CAPS: 100.0000 mg | ORAL_CAPSULE | Freq: Two times a day (BID) | ORAL | Status: DC

## 2014-07-28 NOTE — Progress Notes (Signed)
Pt urine culture positive for UTI at her postpartum visit 7/29.  Macrobid 100 mg BID x 7 days sent to her pharmacy.

## 2014-07-29 NOTE — Progress Notes (Signed)
Attempted to call patient. No answer. Left message stating a medication has been sent to your pharmacy for a UTI, please call clinic with questions or concerns. Will send letter.

## 2014-10-16 ENCOUNTER — Encounter: Payer: Self-pay | Admitting: *Deleted

## 2014-10-28 ENCOUNTER — Encounter: Payer: Self-pay | Admitting: Advanced Practice Midwife

## 2016-04-16 ENCOUNTER — Inpatient Hospital Stay (HOSPITAL_COMMUNITY)
Admission: AD | Admit: 2016-04-16 | Discharge: 2016-04-16 | Disposition: A | Discharge status: Home / Self Care | Payer: Medicaid Other | Source: Ambulatory Visit | Attending: Obstetrics & Gynecology | Admitting: Obstetrics & Gynecology

## 2016-04-16 ENCOUNTER — Encounter (HOSPITAL_COMMUNITY): Payer: Self-pay | Admitting: *Deleted

## 2016-04-16 DIAGNOSIS — F17210 Nicotine dependence, cigarettes, uncomplicated: Secondary | ICD-10-CM | POA: Insufficient documentation

## 2016-04-16 DIAGNOSIS — R000 Tachycardia, unspecified: Secondary | ICD-10-CM | POA: Insufficient documentation

## 2016-04-16 DIAGNOSIS — N10 Acute tubulo-interstitial nephritis: Secondary | ICD-10-CM | POA: Diagnosis not present

## 2016-04-16 DIAGNOSIS — Z590 Homelessness: Secondary | ICD-10-CM | POA: Insufficient documentation

## 2016-04-16 DIAGNOSIS — R109 Unspecified abdominal pain: Secondary | ICD-10-CM | POA: Diagnosis present

## 2016-04-16 LAB — CBC WITH DIFFERENTIAL/PLATELET
Basophils Absolute: 0 10*3/uL (ref 0.0–0.1)
Basophils Relative: 0 %
EOS ABS: 0 10*3/uL (ref 0.0–0.7)
EOS PCT: 0 %
HCT: 31.1 % — ABNORMAL LOW (ref 36.0–46.0)
Hemoglobin: 10.6 g/dL — ABNORMAL LOW (ref 12.0–15.0)
LYMPHS ABS: 1.3 10*3/uL (ref 0.7–4.0)
Lymphocytes Relative: 11 %
MCH: 29.8 pg (ref 26.0–34.0)
MCHC: 34.1 g/dL (ref 30.0–36.0)
MCV: 87.4 fL (ref 78.0–100.0)
MONO ABS: 1.1 10*3/uL — AB (ref 0.1–1.0)
Monocytes Relative: 10 %
Neutro Abs: 9.4 10*3/uL — ABNORMAL HIGH (ref 1.7–7.7)
Neutrophils Relative %: 79 %
Platelets: 326 10*3/uL (ref 150–400)
RBC: 3.56 MIL/uL — ABNORMAL LOW (ref 3.87–5.11)
RDW: 16.5 % — AB (ref 11.5–15.5)
WBC: 11.9 10*3/uL — AB (ref 4.0–10.5)

## 2016-04-16 LAB — URINALYSIS, ROUTINE W REFLEX MICROSCOPIC
Bilirubin Urine: NEGATIVE
Glucose, UA: NEGATIVE mg/dL
Ketones, ur: NEGATIVE mg/dL
Nitrite: NEGATIVE
PH: 5.5 (ref 5.0–8.0)
PROTEIN: 30 mg/dL — AB
Specific Gravity, Urine: 1.01 (ref 1.005–1.030)

## 2016-04-16 LAB — COMPREHENSIVE METABOLIC PANEL
ALBUMIN: 3.7 g/dL (ref 3.5–5.0)
ALT: 12 U/L — AB (ref 14–54)
AST: 16 U/L (ref 15–41)
Alkaline Phosphatase: 76 U/L (ref 38–126)
Anion gap: 8 (ref 5–15)
BUN: 9 mg/dL (ref 6–20)
CHLORIDE: 103 mmol/L (ref 101–111)
CO2: 25 mmol/L (ref 22–32)
Calcium: 8.8 mg/dL — ABNORMAL LOW (ref 8.9–10.3)
Creatinine, Ser: 0.75 mg/dL (ref 0.44–1.00)
GFR calc Af Amer: >60 mL/min (ref >60)
GLUCOSE: 89 mg/dL (ref 65–99)
Potassium: 3.5 mmol/L (ref 3.5–5.1)
Sodium: 136 mmol/L (ref 135–145)
Total Bilirubin: 0.1 mg/dL — ABNORMAL LOW (ref 0.3–1.2)
Total Protein: 7.5 g/dL (ref 6.5–8.1)

## 2016-04-16 LAB — URINE MICROSCOPIC-ADD ON

## 2016-04-16 LAB — LIPASE, BLOOD: Lipase: 14 U/L (ref 11–51)

## 2016-04-16 LAB — LACTIC ACID, PLASMA: LACTIC ACID, VENOUS: 0.9 mmol/L (ref 0.5–2.0)

## 2016-04-16 LAB — AMYLASE: Amylase: 38 U/L (ref 28–100)

## 2016-04-16 LAB — POCT PREGNANCY, URINE: Preg Test, Ur: NEGATIVE

## 2016-04-16 MED ORDER — SODIUM CHLORIDE 0.9 % IV BOLUS (SEPSIS)
1000.0000 mL | INTRAVENOUS | Status: AC
  Administered 2016-04-16 (×2): 1000 mL via INTRAVENOUS

## 2016-04-16 MED ORDER — CIPROFLOXACIN HCL 500 MG PO TABS: 500.0000 mg | ORAL_TABLET | Freq: Two times a day (BID) | ORAL | Status: DC

## 2016-04-16 MED ORDER — TRAMADOL HCL 50 MG PO TABS: 50.0000 mg | ORAL_TABLET | Freq: Four times a day (QID) | ORAL | Status: DC | PRN

## 2016-04-16 MED ORDER — OXYCODONE-ACETAMINOPHEN 5-325 MG PO TABS: 2.0000 | ORAL_TABLET | Freq: Once | ORAL | Status: DC

## 2016-04-16 MED ORDER — OXYCODONE-ACETAMINOPHEN 5-325 MG PO TABS
2.0000 | ORAL_TABLET | Freq: Once | ORAL | Status: AC
  Administered 2016-04-16: 2 via ORAL
  Filled 2016-04-16: qty 2

## 2016-04-16 MED ORDER — DEXTROSE 5 % IV SOLN
2.0000 g | Freq: Once | INTRAVENOUS | Status: AC
  Administered 2016-04-16: 2 g via INTRAVENOUS
  Filled 2016-04-16: qty 2

## 2016-04-16 MED ORDER — IBUPROFEN 600 MG PO TABS
600.0000 mg | ORAL_TABLET | Freq: Once | ORAL | Status: AC
  Administered 2016-04-16: 600 mg via ORAL
  Filled 2016-04-16: qty 1

## 2016-04-16 NOTE — MAU Provider Note (Signed)
Chief Complaint: Pain and Fever   First Provider Initiated Contact with Patient 04/16/16 1522      SUBJECTIVE HPI: [name] is a 30 y.o. [id number] who presents to maternity admissions reporting 4 day history of severe right flank pain and 3 day history of fever.  Patient reports that she began having the right flank pain Tuesday and that it has increased in severity since then, with pain at a 10/10 beginning last night.  Wednesday evening she developed fever to 101, and began feeling malaise and body aches.  Patient reports alternating tylenol and ibuprofen to try to control the pain but denies any pain relief.  Last night the patient reports fever and chills, severe right flank pain, and vomiting.  Patient denies urgency, frequency or dysuria.  She has a hx of pyelonephritis (2011) and at least two UTI's (2/17 and 7/15).  She reports that her current symptoms are similar to when she had pyelonephritis in the past.  She also reports being treated for chlamydia 2-3 years ago.  She denies vaginal discharge, dyspareunia, dyspareunia, h/a, dizziness, and nausea.   HPI  Past Medical History  Diagnosis Date  . Stomach ulcer     never had any tests to confirm this diagni=oses  . Polysubstance abuse   . Adjustment disorder with emotional disturbance   . Metabolic acidosis 03/2013     hospitaliation due to alcohol abuse, dehyration, nausea/vomiting  . 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 Hematemesis 03/2013    along with alcoholic gastritis, hospitalization  . Pyelonephritis 03/2010    hospitalization  . Seizures [facility])    Past Surgical History  Procedure Laterality Date  . Breast lumpectomy Right     benign pathology  . Cesarean section  2005  . Mouth surgery     Social History   Social History  . Marital Status: Single    Spouse Name: N/A  . Number of Children: 1  . Years of Education: College   Occupational History  .  Other    [employer]   Social  History Main Topics  . Smoking status: Current Every Day Smoker -- 0.50 packs/day    Types: Cigarettes    Last Attempt to Quit: 11/26/2013  . Smokeless tobacco: Never Used  . Alcohol Use: Yes     Comment: binge drinking; quit Aug. 2014 very little drinking now  . Drug Use: Yes    Special: Marijuana, Cocaine  . Sexual Activity: Yes    Birth Control/ Protection: None   Other Topics Concern  . Not on file   Social History Narrative   Homeless, lives with friends, family, in hotels, random locations.  Mother is supportive.  No relationship with father.   Questionable relationship with siblings?  No significant other.  Unemployed as of 03/2013.   Patient lives with family.   Caffeine Use: none   No current facility-administered medications on file prior to encounter.   No current outpatient prescriptions on file prior to encounter.   No Known Allergies  ROS:  Review of Systems  Constitutional: Positive for fever, chills and fatigue.  HENT: Negative for congestion.   Eyes: Negative.   Respiratory: Negative.   Cardiovascular: Negative.   Gastrointestinal: Positive for vomiting. Negative for nausea and abdominal pain.  Genitourinary: Positive for flank pain and decreased urine volume. Negative for dysuria, urgency, frequency, pelvic pain and dyspareunia.  Musculoskeletal: Positive for myalgias.  Skin: Negative.   Neurological: Negative for dizziness, light-headedness  and headaches.  Psychiatric/Behavioral: Negative.      I have reviewed patient's Past Medical Hx, Surgical Hx, Family Hx, Social Hx, medications and allergies.   Physical Exam   Patient Vitals for the past 24 hrs:  BP Temp Temp src Pulse Resp SpO2 Height Weight  04/16/16 1449 121/58 mmHg 102.9 F (39.4 C) Oral 110 22 99 %  (1.549 m) 119 lb 6.4 oz (54.159 kg)   Constitutional: Well-developed, thin female in distress.  Cardiovascular: Normal S1S2, tachycardia to 110 Respiratory: Normal work of breathing,  CTAB GI: Abd soft, non-tender. Pos BS x 4 MS: Extremities nontender, no edema, normal ROM Neurologic: Alert and oriented x 4.  GU: Positive R CVAT.  Negative L CVAT.  PELVIC EXAM: Deferred.  LAB RESULTS Results for orders placed or performed during the hospital encounter of 04/16/16 (from the past 24 hour(s))  Urinalysis, Routine w reflex microscopic (not at [facility])     Status: Abnormal   Collection Time: 04/16/16  3:11 PM  Result Value Ref Range   Color, Urine YELLOW YELLOW   APPearance CLOUDY (A) CLEAR   Specific Gravity, Urine 1.010 1.005 - 1.030   pH 5.5 5.0 - 8.0   Glucose, UA NEGATIVE NEGATIVE mg/dL   Hgb urine dipstick MODERATE (A) NEGATIVE   Bilirubin Urine NEGATIVE NEGATIVE   Ketones, ur NEGATIVE NEGATIVE mg/dL   Protein, ur 30 (A) NEGATIVE mg/dL   Nitrite NEGATIVE NEGATIVE   Leukocytes, UA LARGE (A) NEGATIVE  Urine microscopic-add on     Status: Abnormal   Collection Time: 04/16/16  3:11 PM  Result Value Ref Range   Squamous Epithelial / LPF 0-5 (A) NONE SEEN   WBC, UA TOO NUMEROUS TO COUNT 0 - 5 WBC/hpf   RBC / HPF 0-5 0 - 5 RBC/hpf   Bacteria, UA RARE (A) NONE SEEN  Pregnancy, urine POC     Status: None   Collection Time: 04/16/16  3:15 PM  Result Value Ref Range   Preg Test, Ur NEGATIVE NEGATIVE  Comprehensive metabolic panel     Status: Abnormal   Collection Time: 04/16/16  3:20 PM  Result Value Ref Range   Sodium 136 135 - 145 mmol/L   Potassium 3.5 3.5 - 5.1 mmol/L   Chloride 103 101 - 111 mmol/L   CO2 25 22 - 32 mmol/L   Glucose, Bld 89 65 - 99 mg/dL   BUN 9 6 - 20 mg/dL   Creatinine, Ser 9.81 0.44 - 1.00 mg/dL   Calcium 8.8 (L) 8.9 - 10.3 mg/dL   Total Protein 7.5 6.5 - 8.1 g/dL   Albumin 3.7 3.5 - 5.0 g/dL   AST 16 15 - 41 U/L   ALT 12 (L) 14 - 54 U/L   Alkaline Phosphatase 76 38 - 126 U/L   Total Bilirubin 0.1 (L) 0.3 - 1.2 mg/dL   GFR calc non Af Amer >60 >60 mL/min   GFR calc Af Amer >60 >60 mL/min   Anion gap 8 5 - 15  CBC WITH DIFFERENTIAL      Status: Abnormal   Collection Time: 04/16/16  3:20 PM  Result Value Ref Range   WBC 11.9 (H) 4.0 - 10.5 K/uL   RBC 3.56 (L) 3.87 - 5.11 MIL/uL   Hemoglobin 10.6 (L) 12.0 - 15.0 g/dL   HCT 19.1 (L) 47.8 - 29.5 %   MCV 87.4 78.0 - 100.0 fL   MCH 29.8 26.0 - 34.0 pg   MCHC 34.1 30.0 - 36.0 g/dL  RDW 16.5 (H) 11.5 - 15.5 %   Platelets 326 150 - 400 K/uL   Neutrophils Relative % 79 %   Neutro Abs 9.4 (H) 1.7 - 7.7 K/uL   Lymphocytes Relative 11 %   Lymphs Abs 1.3 0.7 - 4.0 K/uL   Monocytes Relative 10 %   Monocytes Absolute 1.1 (H) 0.1 - 1.0 K/uL   Eosinophils Relative 0 %   Eosinophils Absolute 0.0 0.0 - 0.7 K/uL   Basophils Relative 0 %   Basophils Absolute 0.0 0.0 - 0.1 K/uL  Lipase, blood     Status: None   Collection Time: 04/16/16  3:20 PM  Result Value Ref Range   Lipase 14 11 - 51 U/L       IMAGING No results found.  MAU Management/MDM: Ordered labs and reviewed results.  Sepsis protocol initiated r/t fever and increase RR and HR. Will treat for pyelonephritis with Rocephin 2g IV x 1, NS x 2000 ml, Percocet 5/325 x 2 tabs. Pt reports improved symptoms after medications/fluids.  Consult Dr Macon LargeAnyanwu. Pt stable for outpatient therapy after initial IV abx given in MAU.  Pt stable at time of discharge.  ASSESSMENT 1. Acute pyelonephritis     PLAN Discharge home Cipro 500 mg BID x 10 days F/U with primary care if symptoms persist Present to [facility] or [facility] if symptoms are severe    Medication List    TAKE these medications        acetaminophen 500 MG tablet  Commonly known as:  TYLENOL  Take 1,000 mg by mouth every 6 (six) hours as needed for mild pain.     cetirizine 10 MG tablet  Commonly known as:  ZYRTEC  Take 10 mg by mouth daily as needed for allergies.     ciprofloxacin 500 MG tablet  Commonly known as:  CIPRO  Take 1 tablet (500 mg total) by mouth every 12 (twelve) hours.     ibuprofen 200 MG tablet  Commonly known as:  ADVIL,MOTRIN  Take 600 mg  by mouth every 6 (six) hours as needed for fever.       Follow-up Information    Follow up with Ernst BreachYSINGER, DAVID SHANE, PA-C.   Specialty:  Family Medicine   Why:  Follow up with primary care provider if symptoms not improved in 24 hours, go to Emergency Room if symptoms become severe   Contact information:   [address] [place] [address] [phone]       Sharen CounterLisa Leftwich-Kirby Certified Nurse-Midwife 04/16/2016  4:37 PM

## 2016-04-16 NOTE — MAU Note (Signed)
Earlier this week, thought she had pulled a muscle, pain was in rt side/axillary area..  On Tues, woke up with chills -100.1; pain was worse.  Pain has intensified  Since the beginning of the week, chills continue. Continues to take Tylenol and Ibuprofen

## 2016-04-16 NOTE — MAU Note (Signed)
No urinary symptoms, denies vomiting or diarrhea- some nausea, no cold symptoms

## 2016-04-16 NOTE — Discharge Instructions (Signed)

## 2016-04-19 LAB — URINE CULTURE: Culture: >=100000 — AB

## 2016-04-21 LAB — CULTURE, BLOOD (ROUTINE X 2)
CULTURE: NO GROWTH
Culture: NO GROWTH

## 2017-01-24 ENCOUNTER — Encounter: Payer: Self-pay | Admitting: Emergency Medicine

## 2017-01-24 ENCOUNTER — Emergency Department
Admission: EM | Admit: 2017-01-24 | Discharge: 2017-01-24 | Disposition: A | Discharge status: Home / Self Care | Payer: Medicaid Other | Attending: Emergency Medicine | Admitting: Emergency Medicine

## 2017-01-24 DIAGNOSIS — Z791 Long term (current) use of non-steroidal anti-inflammatories (NSAID): Secondary | ICD-10-CM | POA: Insufficient documentation

## 2017-01-24 DIAGNOSIS — N390 Urinary tract infection, site not specified: Secondary | ICD-10-CM

## 2017-01-24 DIAGNOSIS — F17210 Nicotine dependence, cigarettes, uncomplicated: Secondary | ICD-10-CM | POA: Insufficient documentation

## 2017-01-24 LAB — COMPREHENSIVE METABOLIC PANEL
ALK PHOS: 81 U/L (ref 38–126)
ALT: 62 U/L — AB (ref 14–54)
ANION GAP: 14 (ref 5–15)
AST: 91 U/L — ABNORMAL HIGH (ref 15–41)
Albumin: 4.8 g/dL (ref 3.5–5.0)
BILIRUBIN TOTAL: 0.9 mg/dL (ref 0.3–1.2)
BUN: 13 mg/dL (ref 6–20)
CALCIUM: 9.5 mg/dL (ref 8.9–10.3)
CO2: 25 mmol/L (ref 22–32)
CREATININE: 0.95 mg/dL (ref 0.44–1.00)
Chloride: 97 mmol/L — ABNORMAL LOW (ref 101–111)
Glucose, Bld: 100 mg/dL — ABNORMAL HIGH (ref 65–99)
Potassium: 3.4 mmol/L — ABNORMAL LOW (ref 3.5–5.1)
Sodium: 136 mmol/L (ref 135–145)
Total Protein: 8.6 g/dL — ABNORMAL HIGH (ref 6.5–8.1)

## 2017-01-24 LAB — CBC
HCT: 38.3 % (ref 35.0–47.0)
HEMOGLOBIN: 13 g/dL (ref 12.0–16.0)
MCH: 31 pg (ref 26.0–34.0)
MCHC: 33.9 g/dL (ref 32.0–36.0)
MCV: 91.3 fL (ref 80.0–100.0)
PLATELETS: 341 10*3/uL (ref 150–440)
RBC: 4.19 MIL/uL (ref 3.80–5.20)
RDW: 15.8 % — ABNORMAL HIGH (ref 11.5–14.5)
WBC: 8 10*3/uL (ref 3.6–11.0)

## 2017-01-24 LAB — URINALYSIS, COMPLETE (UACMP) WITH MICROSCOPIC
Bacteria, UA: NONE SEEN
Bilirubin Urine: NEGATIVE
Glucose, UA: 50 mg/dL — AB
KETONES UR: 80 mg/dL — AB
NITRITE: NEGATIVE
PH: 5 (ref 5.0–8.0)
Protein, ur: >=300 mg/dL — AB
Specific Gravity, Urine: 1.029 (ref 1.005–1.030)

## 2017-01-24 LAB — LIPASE, BLOOD: Lipase: 21 U/L (ref 11–51)

## 2017-01-24 LAB — POC URINE PREG, ED: Preg Test, Ur: NEGATIVE

## 2017-01-24 MED ORDER — NITROFURANTOIN MONOHYD MACRO 100 MG PO CAPS: 100.0000 mg | ORAL_CAPSULE | Freq: Two times a day (BID) | ORAL | 0 refills | Status: AC

## 2017-01-24 MED ORDER — ONDANSETRON HCL 4 MG PO TABS: 4.0000 mg | ORAL_TABLET | Freq: Three times a day (TID) | ORAL | 0 refills | Status: DC | PRN

## 2017-01-24 MED ORDER — ONDANSETRON 4 MG PO TBDP
4.0000 mg | ORAL_TABLET | Freq: Once | ORAL | Status: AC | PRN
  Administered 2017-01-24: 4 mg via ORAL
  Filled 2017-01-24: qty 1

## 2017-01-24 NOTE — ED Provider Notes (Signed)
[facility] Emergency Department Provider Note  ____________________________________________   I have reviewed the triage vital signs and the nursing notes.   HISTORY  Chief Complaint Emesis   History limited by: Not Limited   HPI [name] is a 31 y.o. female who presents to the emergency department today because of concerns for nausea and vomiting. It started 2 days ago. It has been fairly constant. The patient states that she has had nausea and vomiting with her attempts at oral intake. The patient has not had any diarrhea. She hasn't had any measured fevers but feels like she has had some chills. Her daughter was recently sick 1 week ago.   Past Medical History:  Diagnosis Date  . Adjustment disorder with emotional disturbance   . Alcoholic gastritis 03/2013    hospitalization  . Hematemesis 03/2013   along with alcoholic gastritis, hospitalization  . Metabolic acidosis 03/2013    hospitaliation due to alcohol abuse, dehyration, nausea/vomiting  . Polysubstance abuse   . Pyelonephritis 03/2010   hospitalization  . Seizures (HCC)   . Stomach ulcer    never had any tests to confirm this diagni=oses    Patient Active Problem List   Diagnosis Date Noted  . Active labor 06/09/2014  . Previous cesarean section complicating pregnancy, antepartum 01/31/2014  . Supervision of normal pregnancy 11/08/2013  . Anemia 04/19/2013  . Abdominal pain, epigastric 04/17/2013  . Hematemesis- mild 04/04/2013  . Intractable nausea, vomiting & abdominal pain. 04/04/2013  . Dehydration 04/04/2013  . Hypokalemia 04/04/2013  . Tobacco abuse 04/04/2013  . Alcohol dependence (HCC) 04/04/2013  . Metabolic acidosis, increased anion gap 04/04/2013    Past Surgical History:  Procedure Laterality Date  . BREAST LUMPECTOMY Right    benign pathology  . CESAREAN SECTION  2005  . MOUTH SURGERY      Prior to Admission medications   Medication Sig Start Date End  Date Taking? Authorizing Provider  acetaminophen (TYLENOL) 500 MG tablet Take 1,000 mg by mouth every 6 (six) hours as needed for mild pain.    Historical Provider, MD  cetirizine (ZYRTEC) 10 MG tablet Take 10 mg by mouth daily as needed for allergies.    Historical Provider, MD  ciprofloxacin (CIPRO) 500 MG tablet Take 1 tablet (500 mg total) by mouth every 12 (twelve) hours. 04/16/16   Lisa A Leftwich-Kirby, CNM  ibuprofen (ADVIL,MOTRIN) 200 MG tablet Take 600 mg by mouth every 6 (six) hours as needed for fever.    Historical Provider, MD  traMADol (ULTRAM) 50 MG tablet Take 1 tablet (50 mg total) by mouth every 6 (six) hours as needed. 04/16/16   Hurshel Party, CNM    Allergies Patient has no known allergies.  Family History  Problem Relation Age of Onset  . Diabetes Mother   . Hypertension Father   . Diabetes Maternal Aunt   . Diabetes Maternal Uncle   . Cancer Maternal Uncle   . Diabetes Maternal Grandmother   . Cancer Maternal Grandmother   . Diabetes Maternal Grandfather     Social History Social History  Substance Use Topics  . Smoking status: Current Every Day Smoker    Packs/day: 0.50    Types: Cigarettes    Last attempt to quit: 11/26/2013  . Smokeless tobacco: Never Used  . Alcohol use Yes     Comment: binge drinking; quit Aug. 2014 very little drinking now    Review of Systems  Constitutional: Negative for fever. Cardiovascular: Negative for  chest pain. Respiratory: Negative for shortness of breath. Gastrointestinal: Positive for nausea and vomiting. Neurological: Negative for headaches, focal weakness or numbness.  10-point ROS otherwise negative.  ____________________________________________   PHYSICAL EXAM:  VITAL SIGNS: ED Triage Vitals  Enc Vitals Group     BP 01/24/17 1740 119/69     Pulse Rate 01/24/17 1740 66     Resp 01/24/17 1740 18     Temp 01/24/17 1740 99.2 F (37.3 C)     Temp Source 01/24/17 1740 Oral     SpO2 01/24/17 1740  97 %     Weight --      Height 01/24/17 1740 5\' 2"  (1.575 m)     Head Circumference --      Peak Flow --      Pain Score 01/24/17 1741 7    Constitutional: Alert and oriented. Well appearing and in no distress. Eyes: Conjunctivae are normal. Normal extraocular movements. ENT   Head: Normocephalic and atraumatic.   Nose: No congestion/rhinnorhea.   Mouth/Throat: Mucous membranes are moist.   Neck: No stridor. Hematological/Lymphatic/Immunilogical: No cervical lymphadenopathy. Cardiovascular: Normal rate, regular rhythm.  No murmurs, rubs, or gallops.  Respiratory: Normal respiratory effort without tachypnea nor retractions. Breath sounds are clear and equal bilaterally. No wheezes/rales/rhonchi. Gastrointestinal: Soft and non tender. No rebound. No guarding.  Genitourinary: Deferred Musculoskeletal: Normal range of motion in all extremities. No lower extremity edema. Neurologic:  Normal speech and language. No gross focal neurologic deficits are appreciated.  Skin:  Skin is warm, dry and intact. No rash noted. Psychiatric: Mood and affect are normal. Speech and behavior are normal. Patient exhibits appropriate insight and judgment.  ____________________________________________    LABS (pertinent positives/negatives)  Labs Reviewed  COMPREHENSIVE METABOLIC PANEL - Abnormal; Notable for the following:       Result Value   Potassium 3.4 (*)    Chloride 97 (*)    Glucose, Bld 100 (*)    Total Protein 8.6 (*)    AST 91 (*)    ALT 62 (*)    All other components within normal limits  CBC - Abnormal; Notable for the following:    RDW 15.8 (*)    All other components within normal limits  URINALYSIS, COMPLETE (UACMP) WITH MICROSCOPIC - Abnormal; Notable for the following:    Color, Urine YELLOW (*)    APPearance HAZY (*)    Glucose, UA 50 (*)    Hgb urine dipstick MODERATE (*)    Ketones, ur 80 (*)    Protein, ur >=300 (*)    Leukocytes, UA TRACE (*)    Squamous  Epithelial / LPF 6-30 (*)    All other components within normal limits  LIPASE, BLOOD  POC URINE PREG, ED     ____________________________________________   EKG  None  ____________________________________________    RADIOLOGY  None  ____________________________________________   PROCEDURES  Procedures  ____________________________________________   INITIAL IMPRESSION / ASSESSMENT AND PLAN / ED COURSE  Pertinent labs & imaging results that were available during my care of the patient were reviewed by me and considered in my medical decision making (see chart for details).  Patient with nausea and vomiting. UA concerning for possible UTI. Will give abx and antiemetics.   ____________________________________________   FINAL CLINICAL IMPRESSION(S) / ED DIAGNOSES  Final diagnoses:  Lower urinary tract infectious disease     Note: This dictation was prepared with Dragon dictation. Any transcriptional errors that result from this process are unintentional  Phineas Semen, MD 01/24/17 2215

## 2017-01-24 NOTE — ED Notes (Signed)
Pt could not urinate at this time; given labeled specimen cup with wipes to bring us when she can.

## 2017-01-24 NOTE — Discharge Instructions (Signed)
Please seek medical attention for any high fevers, chest pain, shortness of breath, change in behavior, persistent vomiting, bloody stool or any other new or concerning symptoms.  

## 2017-01-24 NOTE — ED Triage Notes (Signed)
Pt c/o vomiting and general abdominal soreness since Saturday. Denies diarrhea. Unable to keep anything down. Denies fevers. Ambulatory to triage. No distress but appears to feel bad.

## 2017-01-28 ENCOUNTER — Encounter (HOSPITAL_COMMUNITY): Payer: Self-pay | Admitting: Emergency Medicine

## 2017-01-28 ENCOUNTER — Emergency Department (HOSPITAL_COMMUNITY)
Admission: EM | Admit: 2017-01-28 | Discharge: 2017-01-28 | Disposition: A | Discharge status: Home / Self Care | Payer: Self-pay | Attending: Emergency Medicine | Admitting: Emergency Medicine

## 2017-01-28 ENCOUNTER — Emergency Department (HOSPITAL_COMMUNITY): Payer: Self-pay

## 2017-01-28 DIAGNOSIS — R112 Nausea with vomiting, unspecified: Secondary | ICD-10-CM

## 2017-01-28 DIAGNOSIS — K2970 Gastritis, unspecified, without bleeding: Secondary | ICD-10-CM | POA: Insufficient documentation

## 2017-01-28 DIAGNOSIS — F17210 Nicotine dependence, cigarettes, uncomplicated: Secondary | ICD-10-CM | POA: Insufficient documentation

## 2017-01-28 DIAGNOSIS — Z79899 Other long term (current) drug therapy: Secondary | ICD-10-CM | POA: Insufficient documentation

## 2017-01-28 LAB — CBC WITH DIFFERENTIAL/PLATELET
BASOS ABS: 0 10*3/uL (ref 0.0–0.1)
Basophils Relative: 0 %
EOS PCT: 1 %
Eosinophils Absolute: 0.1 10*3/uL (ref 0.0–0.7)
HCT: 38.3 % (ref 36.0–46.0)
Hemoglobin: 13.1 g/dL (ref 12.0–15.0)
LYMPHS PCT: 28 %
Lymphs Abs: 1.8 10*3/uL (ref 0.7–4.0)
MCH: 30.4 pg (ref 26.0–34.0)
MCHC: 34.2 g/dL (ref 30.0–36.0)
MCV: 88.9 fL (ref 78.0–100.0)
MONO ABS: 0.6 10*3/uL (ref 0.1–1.0)
Monocytes Relative: 9 %
Neutro Abs: 3.9 10*3/uL (ref 1.7–7.7)
Neutrophils Relative %: 62 %
PLATELETS: 326 10*3/uL (ref 150–400)
RBC: 4.31 MIL/uL (ref 3.87–5.11)
RDW: 14.8 % (ref 11.5–15.5)
WBC: 6.4 10*3/uL (ref 4.0–10.5)

## 2017-01-28 LAB — URINALYSIS, ROUTINE W REFLEX MICROSCOPIC
BACTERIA UA: NONE SEEN
BILIRUBIN URINE: NEGATIVE
Glucose, UA: NEGATIVE mg/dL
Ketones, ur: 20 mg/dL — AB
NITRITE: NEGATIVE
Protein, ur: 30 mg/dL — AB
SPECIFIC GRAVITY, URINE: 1.026 (ref 1.005–1.030)
pH: 5 (ref 5.0–8.0)

## 2017-01-28 LAB — BASIC METABOLIC PANEL
Anion gap: 15 (ref 5–15)
BUN: 9 mg/dL (ref 6–20)
CALCIUM: 9.2 mg/dL (ref 8.9–10.3)
CO2: 24 mmol/L (ref 22–32)
CREATININE: 0.73 mg/dL (ref 0.44–1.00)
Chloride: 96 mmol/L — ABNORMAL LOW (ref 101–111)
GFR calc Af Amer: >60 mL/min (ref >60)
GLUCOSE: 83 mg/dL (ref 65–99)
Potassium: 2.9 mmol/L — ABNORMAL LOW (ref 3.5–5.1)
Sodium: 135 mmol/L (ref 135–145)

## 2017-01-28 LAB — RAPID URINE DRUG SCREEN, HOSP PERFORMED
Amphetamines: NOT DETECTED
Barbiturates: NOT DETECTED
Benzodiazepines: NOT DETECTED
Cocaine: NOT DETECTED
Opiates: NOT DETECTED
Tetrahydrocannabinol: POSITIVE — AB

## 2017-01-28 LAB — PREGNANCY, URINE: PREG TEST UR: NEGATIVE

## 2017-01-28 LAB — HEPATIC FUNCTION PANEL
ALK PHOS: 65 U/L (ref 38–126)
ALT: 30 U/L (ref 14–54)
AST: 21 U/L (ref 15–41)
Albumin: 4.5 g/dL (ref 3.5–5.0)
BILIRUBIN INDIRECT: 1.2 mg/dL — AB (ref 0.3–0.9)
BILIRUBIN TOTAL: 1.4 mg/dL — AB (ref 0.3–1.2)
Bilirubin, Direct: 0.2 mg/dL (ref 0.1–0.5)
Total Protein: 7.6 g/dL (ref 6.5–8.1)

## 2017-01-28 LAB — I-STAT BETA HCG BLOOD, ED (MC, WL, AP ONLY): I-stat hCG, quantitative: <5 m[IU]/mL (ref <5)

## 2017-01-28 LAB — LIPASE, BLOOD: LIPASE: 23 U/L (ref 11–51)

## 2017-01-28 MED ORDER — SODIUM CHLORIDE 0.9 % IV BOLUS (SEPSIS)
1000.0000 mL | Freq: Once | INTRAVENOUS | Status: AC
  Administered 2017-01-28: 1000 mL via INTRAVENOUS

## 2017-01-28 MED ORDER — ONDANSETRON HCL 4 MG/2ML IJ SOLN
4.0000 mg | Freq: Once | INTRAMUSCULAR | Status: AC
  Administered 2017-01-28: 4 mg via INTRAVENOUS
  Filled 2017-01-28: qty 2

## 2017-01-28 MED ORDER — POTASSIUM CHLORIDE CRYS ER 20 MEQ PO TBCR
40.0000 meq | EXTENDED_RELEASE_TABLET | Freq: Once | ORAL | Status: AC
  Administered 2017-01-28: 40 meq via ORAL
  Filled 2017-01-28: qty 2

## 2017-01-28 MED ORDER — SUCRALFATE 1 G PO TABS: 1.0000 g | ORAL_TABLET | Freq: Three times a day (TID) | ORAL | 0 refills | Status: DC

## 2017-01-28 MED ORDER — PROMETHAZINE HCL 25 MG/ML IJ SOLN
12.5000 mg | Freq: Once | INTRAMUSCULAR | Status: AC
  Administered 2017-01-28: 12.5 mg via INTRAVENOUS
  Filled 2017-01-28: qty 1

## 2017-01-28 MED ORDER — PROMETHAZINE HCL 25 MG PO TABS: 25.0000 mg | ORAL_TABLET | Freq: Four times a day (QID) | ORAL | 0 refills | Status: DC | PRN

## 2017-01-28 MED ORDER — FAMOTIDINE IN NACL 20-0.9 MG/50ML-% IV SOLN
20.0000 mg | Freq: Once | INTRAVENOUS | Status: AC
  Administered 2017-01-28: 20 mg via INTRAVENOUS
  Filled 2017-01-28: qty 50

## 2017-01-28 MED ORDER — PANTOPRAZOLE SODIUM 40 MG IV SOLR
40.0000 mg | Freq: Once | INTRAVENOUS | Status: AC
  Administered 2017-01-28: 40 mg via INTRAVENOUS
  Filled 2017-01-28: qty 40

## 2017-01-28 MED ORDER — OMEPRAZOLE 20 MG PO CPDR: 20.0000 mg | DELAYED_RELEASE_CAPSULE | Freq: Every day | ORAL | 0 refills | Status: DC

## 2017-01-28 MED ORDER — IOPAMIDOL (ISOVUE-300) INJECTION 61%
75.0000 mL | Freq: Once | INTRAVENOUS | Status: AC | PRN
  Administered 2017-01-28: 75 mL via INTRAVENOUS

## 2017-01-28 MED ORDER — GI COCKTAIL ~~LOC~~
30.0000 mL | Freq: Once | ORAL | Status: AC
  Administered 2017-01-28: 30 mL via ORAL
  Filled 2017-01-28: qty 30

## 2017-01-28 NOTE — ED Notes (Signed)
It informed of the need of a urine  Sample. Pt stated she is unable to provide.

## 2017-01-28 NOTE — ED Notes (Signed)
Pt. tolerated oral H2O without feeling nauseous .

## 2017-01-28 NOTE — ED Provider Notes (Signed)
[facility]-EMERGENCY DEPT Provider Note   CSN: [id number] Arrival date & time: 01/28/17  0411     History   Chief Complaint Chief Complaint  Patient presents with  . Abdominal Pain    HPI [name] is a 31 y.o. female.  Patient with persistent epigastric pain nausea and vomiting for the past 4 days. She states she developed epigastric pain with nausea and vomiting and inability to keep anything down. She was seen at [facility] on January 29 and diagnosed with urinary tract antibiotics which she cannot keep down. Her vomiting has been clear yellow and green. Denies any blood. Denies diarrhea. Denies fever. Has had some dysuria and hematuria. Denies any abnormal vaginal bleeding or discharge. Feels somewhat when she was diagnosed with gastritis several years ago. States she possibly had an ulcer but she's never had an endoscopy. Admits to binging on alcohol occasionally. Denies any chronic medication use and she no longer takes a PPI. Denies sick contacts or recent travel.   The history is provided by the patient.  Abdominal Pain   Associated symptoms include nausea, vomiting, dysuria and hematuria. Pertinent negatives include fever, diarrhea, constipation, headaches and arthralgias.    Past Medical History:  Diagnosis Date  . Adjustment disorder with emotional disturbance   . Alcoholic gastritis 03/2013    hospitalization  . Hematemesis 03/2013   along with alcoholic gastritis, hospitalization  . Metabolic acidosis 03/2013    hospitaliation due to alcohol abuse, dehyration, nausea/vomiting  . Polysubstance abuse   . Pyelonephritis 03/2010   hospitalization  . Seizures (HCC)   . Stomach ulcer    never had any tests to confirm this diagni=oses    Patient Active Problem List   Diagnosis Date Noted  . Active labor 06/09/2014  . Previous cesarean section complicating pregnancy, antepartum 01/31/2014  . Supervision of normal pregnancy 11/08/2013  . Anemia 04/19/2013  .  Abdominal pain, epigastric 04/17/2013  . Hematemesis- mild 04/04/2013  . Intractable nausea, vomiting & abdominal pain. 04/04/2013  . Dehydration 04/04/2013  . Hypokalemia 04/04/2013  . Tobacco abuse 04/04/2013  . Alcohol dependence (HCC) 04/04/2013  . Metabolic acidosis, increased anion gap 04/04/2013    Past Surgical History:  Procedure Laterality Date  . BREAST LUMPECTOMY Right    benign pathology  . CESAREAN SECTION  2005  . MOUTH SURGERY      OB History    Gravida Para Term Preterm AB Living   3 2 1 1 1 2    SAB TAB Ectopic Multiple Live Births   0 1 0 0 2       Home Medications    Prior to Admission medications   Medication Sig Start Date End Date Taking? Authorizing Provider  acetaminophen (TYLENOL) 500 MG tablet Take 1,000 mg by mouth every 6 (six) hours as needed for mild pain.    Historical Provider, MD  cetirizine (ZYRTEC) 10 MG tablet Take 10 mg by mouth daily as needed for allergies.    Historical Provider, MD  ciprofloxacin (CIPRO) 500 MG tablet Take 1 tablet (500 mg total) by mouth every 12 (twelve) hours. 04/16/16   Lisa A Leftwich-Kirby, CNM  ibuprofen (ADVIL,MOTRIN) 200 MG tablet Take 600 mg by mouth every 6 (six) hours as needed for fever.    Historical Provider, MD  nitrofurantoin, macrocrystal-monohydrate, (MACROBID) 100 MG capsule Take 1 capsule (100 mg total) by mouth 2 (two) times daily. 01/24/17 01/29/17  Phineas SemenGraydon Goodman, MD  ondansetron (ZOFRAN) 4 MG tablet Take 1 tablet (4 mg  total) by mouth every 8 (eight) hours as needed for nausea or vomiting. 01/24/17   Phineas Semen, MD  traMADol (ULTRAM) 50 MG tablet Take 1 tablet (50 mg total) by mouth every 6 (six) hours as needed. 04/16/16   Hurshel Party, CNM    Family History Family History  Problem Relation Age of Onset  . Diabetes Mother   . Hypertension Father   . Diabetes Maternal Aunt   . Diabetes Maternal Uncle   . Cancer Maternal Uncle   . Diabetes Maternal Grandmother   . Cancer  Maternal Grandmother   . Diabetes Maternal Grandfather     Social History Social History  Substance Use Topics  . Smoking status: Current Every Day Smoker    Packs/day: 0.50    Types: Cigarettes    Last attempt to quit: 11/26/2013  . Smokeless tobacco: Never Used  . Alcohol use Yes     Comment: binge drinking; quit Aug. 2014 very little drinking now     Allergies   Patient has no known allergies.   Review of Systems Review of Systems  Constitutional: Positive for activity change, appetite change and fatigue. Negative for fever.  Respiratory: Negative for cough and shortness of breath.   Cardiovascular: Negative for chest pain.  Gastrointestinal: Positive for abdominal pain, nausea and vomiting. Negative for constipation and diarrhea.  Genitourinary: Positive for dysuria and hematuria. Negative for vaginal bleeding and vaginal discharge.  Musculoskeletal: Negative for arthralgias and neck pain.  Skin: Negative for rash.  Neurological: Negative for dizziness, weakness, light-headedness and headaches.   A complete 10 system review of systems was obtained and all systems are negative except as noted in the HPI and PMH.    Physical Exam Updated Vital Signs BP 120/75   Pulse (!) 57   Ht 5\' 2"  (1.575 m)   Wt 125 lb (56.7 kg)   LMP 12/29/2016 (Approximate)   SpO2 99%   BMI 22.86 kg/m   Physical Exam  Constitutional: She is oriented to person, place, and time. She appears well-developed and well-nourished. No distress.  HENT:  Head: Normocephalic and atraumatic.  Mouth/Throat: Oropharynx is clear and moist. No oropharyngeal exudate.  Dry mucus membranes  Eyes: Conjunctivae and EOM are normal. Pupils are equal, round, and reactive to light.  Neck: Normal range of motion. Neck supple.  No meningismus.  Cardiovascular: Normal rate, regular rhythm, normal heart sounds and intact distal pulses.   No murmur heard. Pulmonary/Chest: Effort normal and breath sounds normal. No  respiratory distress.  Abdominal: Soft. There is tenderness. There is no rebound and no guarding.  Soft, epigastric tenderness. No RUQ tenderness. No guarding or rebound  Musculoskeletal: Normal range of motion. She exhibits no edema or tenderness.  No CVAT  Neurological: She is alert and oriented to person, place, and time. No cranial nerve deficit. She exhibits normal muscle tone. Coordination normal.  No ataxia on finger to nose bilaterally. No pronator drift. 5/5 strength throughout. CN 2-12 intact.Equal grip strength. Sensation intact.   Skin: Skin is warm.  Psychiatric: She has a normal mood and affect. Her behavior is normal.  Nursing note and vitals reviewed.    ED Treatments / Results  Labs (all labs ordered are listed, but only abnormal results are displayed) Labs Reviewed  URINALYSIS, ROUTINE W REFLEX MICROSCOPIC - Abnormal; Notable for the following:       Result Value   Color, Urine AMBER (*)    APPearance HAZY (*)    Hgb urine dipstick MODERATE (*)  Ketones, ur 20 (*)    Protein, ur 30 (*)    Leukocytes, UA TRACE (*)    Squamous Epithelial / LPF 0-5 (*)    All other components within normal limits  BASIC METABOLIC PANEL - Abnormal; Notable for the following:    Potassium 2.9 (*)    Chloride 96 (*)    All other components within normal limits  HEPATIC FUNCTION PANEL - Abnormal; Notable for the following:    Total Bilirubin 1.4 (*)    Indirect Bilirubin 1.2 (*)    All other components within normal limits  RAPID URINE DRUG SCREEN, HOSP PERFORMED - Abnormal; Notable for the following:    Tetrahydrocannabinol POSITIVE (*)    All other components within normal limits  PREGNANCY, URINE  CBC WITH DIFFERENTIAL/PLATELET  LIPASE, BLOOD  I-STAT BETA HCG BLOOD, ED ([facility], [facility], [facility] ONLY)    EKG  EKG Interpretation None       Radiology Ct Abdomen Pelvis W Contrast  Result Date: 01/28/2017 CLINICAL DATA:  Epigastric pain. History of stomach ulcers, gastritis,  pyelonephritis. EXAM: CT ABDOMEN AND PELVIS WITH CONTRAST TECHNIQUE: Multidetector CT imaging of the abdomen and pelvis was performed using the standard protocol following bolus administration of intravenous contrast. CONTRAST:  75mL ISOVUE-300 IOPAMIDOL (ISOVUE-300) INJECTION 61% COMPARISON:  CT abdomen and pelvis April 17, 2013 cyst FINDINGS: LOWER CHEST: Lung bases are clear. Included heart size is normal. No pericardial effusion. HEPATOBILIARY: Focal infiltration about the falciform ligament, liver is otherwise unremarkable. Normal gallbladder. PANCREAS: Normal. SPLEEN: Normal. ADRENALS/URINARY TRACT: Kidneys are orthotopic, demonstrating symmetric enhancement. No nephrolithiasis, hydronephrosis or solid renal masses. Early excretion of contrast limits detection of small nonobstructing nephrolithiasis. Urinary bladder is decompressed and unremarkable. Normal adrenal glands. STOMACH/BOWEL: Mild stomach wall thickening greater than expected for degree of distension. The small and large bowel are normal in course and caliber without inflammatory changes. Normal appendix. VASCULAR/LYMPHATIC: Aortoiliac vessels are normal in course and caliber. No lymphadenopathy by CT size criteria. REPRODUCTIVE: Normal. OTHER: No intraperitoneal free fluid or free air. MUSCULOSKELETAL: Nonacute. IMPRESSION: Mild stomach wall thickening concerning for gastritis. Electronically Signed   By: Awilda Metro M.D.   On: 01/28/2017 06:11    Procedures Procedures (including critical care time)  Medications Ordered in ED Medications  sodium chloride 0.9 % bolus 1,000 mL (not administered)  ondansetron (ZOFRAN) injection 4 mg (not administered)  gi cocktail (Maalox,Lidocaine,Donnatal) (not administered)     Initial Impression / Assessment and Plan / ED Course  I have reviewed the triage vital signs and the nursing notes.  Pertinent labs & imaging results that were available during my care of the patient were reviewed by  me and considered in my medical decision making (see chart for details).     Ongoing epigastric pain with nausea and vomiting for the past 4 days. No fever. Recently treated for UTI.  HCG negative. LFTs and lipase normal. Epigastric tenderness. Has had alcoholic gastritis in past. Never had EGD>   PPI, pepcid, antiemetics, IVF CT shows gastritis. No vomiting in the ED. Tolerating PO, abdomen soft.   D/w patient avoid alcohol, NSAIDS, caffeine, spicy foods.  Start PPI, follow up with GI, return precautions discussed.  Final Clinical Impressions(s) / ED Diagnoses   Final diagnoses:  Gastritis without bleeding, unspecified chronicity, unspecified gastritis type  Non-intractable vomiting with nausea, unspecified vomiting type    New Prescriptions New Prescriptions   No medications on file     Glynn Octave, MD 01/28/17 [phone]

## 2017-01-28 NOTE — Discharge Instructions (Signed)
Take the stomach medications as prescribed. Reduce youralcohol intake. Avoid NSAIDs, caffeine, spicy foods, alcohol. Follow up with your primary doctor and gastroenterology. Return to the ED if you develop new or worsening symptoms.

## 2017-01-28 NOTE — ED Notes (Signed)
Pt requesting a GI cocktail 

## 2017-01-28 NOTE — ED Triage Notes (Signed)
Pt c/o 10/10 abd pain mostly on her left side, states she was seen during this week for the same ans sent home with zofran and PO ABX pat taking medication with no relief. Unable to keep food or liquid down.

## 2017-07-27 ENCOUNTER — Emergency Department (HOSPITAL_COMMUNITY)
Admission: EM | Admit: 2017-07-27 | Discharge: 2017-07-27 | Disposition: A | Discharge status: Home / Self Care | Payer: Medicaid Other | Attending: Emergency Medicine | Admitting: Emergency Medicine

## 2017-07-27 ENCOUNTER — Emergency Department (HOSPITAL_COMMUNITY): Payer: Medicaid Other

## 2017-07-27 ENCOUNTER — Encounter (HOSPITAL_COMMUNITY): Payer: Self-pay | Admitting: Emergency Medicine

## 2017-07-27 DIAGNOSIS — S50311A Abrasion of right elbow, initial encounter: Secondary | ICD-10-CM | POA: Insufficient documentation

## 2017-07-27 DIAGNOSIS — Z79899 Other long term (current) drug therapy: Secondary | ICD-10-CM | POA: Insufficient documentation

## 2017-07-27 DIAGNOSIS — T148XXA Other injury of unspecified body region, initial encounter: Secondary | ICD-10-CM

## 2017-07-27 DIAGNOSIS — F17210 Nicotine dependence, cigarettes, uncomplicated: Secondary | ICD-10-CM | POA: Insufficient documentation

## 2017-07-27 DIAGNOSIS — Y939 Activity, unspecified: Secondary | ICD-10-CM | POA: Insufficient documentation

## 2017-07-27 DIAGNOSIS — W010XXA Fall on same level from slipping, tripping and stumbling without subsequent striking against object, initial encounter: Secondary | ICD-10-CM | POA: Insufficient documentation

## 2017-07-27 DIAGNOSIS — Y929 Unspecified place or not applicable: Secondary | ICD-10-CM | POA: Insufficient documentation

## 2017-07-27 DIAGNOSIS — M25521 Pain in right elbow: Secondary | ICD-10-CM

## 2017-07-27 DIAGNOSIS — Y999 Unspecified external cause status: Secondary | ICD-10-CM | POA: Insufficient documentation

## 2017-07-27 MED ORDER — BACITRACIN ZINC 500 UNIT/GM EX OINT
TOPICAL_OINTMENT | Freq: Once | CUTANEOUS | Status: AC
  Administered 2017-07-27: 1 via TOPICAL
  Filled 2017-07-27: qty 0.9

## 2017-07-27 NOTE — ED Provider Notes (Signed)
[facility]-EMERGENCY DEPT Provider Note   CSN: [id number] Arrival date & time: 07/27/17  1228     History   Chief Complaint Chief Complaint  Patient presents with  . Elbow Pain    HPI [name] is a 31 y.o. female.  The history is provided by the patient and medical records. No language interpreter was used.    [name] is a 31 y.o. female  with a PMH of seizures, polysubstance abuse, gastritis who presents to the Emergency Department complaining of acute onset of right elbow pain after falling onto concrete yesterday. No LOC or head injury. Pain is worse with ROM and has been constant since injury. She took half a percocet at home last night which made her fall asleep so she is not sure if it helped or not. Also endorses abrasion to the elbow which she cleaned and has been applying topical OTC ABX ointment.Tetanus up-to date.  No weakness, numbness, tingling.   Past Medical History:  Diagnosis Date  . Adjustment disorder with emotional disturbance   . Alcoholic gastritis 03/2013    hospitalization  . Hematemesis 03/2013   along with alcoholic gastritis, hospitalization  . Metabolic acidosis 03/2013    hospitaliation due to alcohol abuse, dehyration, nausea/vomiting  . Polysubstance abuse   . Pyelonephritis 03/2010   hospitalization  . Seizures (HCC)   . Stomach ulcer    never had any tests to confirm this diagni=oses    Patient Active Problem List   Diagnosis Date Noted  . Active labor 06/09/2014  . Previous cesarean section complicating pregnancy, antepartum 01/31/2014  . Supervision of normal pregnancy 11/08/2013  . Anemia 04/19/2013  . Abdominal pain, epigastric 04/17/2013  . Hematemesis- mild 04/04/2013  . Intractable nausea, vomiting & abdominal pain. 04/04/2013  . Dehydration 04/04/2013  . Hypokalemia 04/04/2013  . Tobacco abuse 04/04/2013  . Alcohol dependence (HCC) 04/04/2013  . Metabolic acidosis, increased anion gap 04/04/2013    Past Surgical  History:  Procedure Laterality Date  . BREAST LUMPECTOMY Right    benign pathology  . CESAREAN SECTION  2005  . MOUTH SURGERY      OB History    Gravida Para Term Preterm AB Living   3 2 1 1 1 2    SAB TAB Ectopic Multiple Live Births   0 1 0 0 2       Home Medications    Prior to Admission medications   Medication Sig Start Date End Date Taking? Authorizing Provider  acetaminophen (TYLENOL) 500 MG tablet Take 1,000 mg by mouth every 6 (six) hours as needed for mild pain.    [provider]  cetirizine (ZYRTEC) 10 MG tablet Take 10 mg by mouth daily as needed for allergies.    [provider]  ibuprofen (ADVIL,MOTRIN) 200 MG tablet Take 600 mg by mouth every 6 (six) hours as needed for fever.    [provider]  omeprazole (PRILOSEC) 20 MG capsule Take 1 capsule (20 mg total) by mouth daily. 01/28/17   Rancour, Jeannett SeniorStephen, MD  ondansetron (ZOFRAN) 4 MG tablet Take 1 tablet (4 mg total) by mouth every 8 (eight) hours as needed for nausea or vomiting. 01/24/17   Phineas SemenGoodman, Graydon, MD  promethazine (PHENERGAN) 25 MG tablet Take 1 tablet (25 mg total) by mouth every 6 (six) hours as needed for nausea or vomiting. 01/28/17   Rancour, Jeannett SeniorStephen, MD  sucralfate (CARAFATE) 1 g tablet Take 1 tablet (1 g total) by mouth 4 (four) times daily -  with meals and at bedtime. 01/28/17   Rancour, Jeannett SeniorStephen, MD  traMADol (ULTRAM) 50 MG tablet Take 1 tablet (50 mg total) by mouth every 6 (six) hours as needed. Patient taking differently: Take 50 mg by mouth every 6 (six) hours as needed for moderate pain.  04/16/16   Leftwich-Kirby, Wilmer FloorLisa A, CNM    Family History Family History  Problem Relation Age of Onset  . Diabetes Mother   . Hypertension Father   . Diabetes Maternal Aunt   . Diabetes Maternal Uncle   . Cancer Maternal Uncle   . Diabetes Maternal Grandmother   . Cancer Maternal Grandmother   . Diabetes Maternal Grandfather     Social History Social History  Substance Use  Topics  . Smoking status: Current Every Day Smoker    Packs/day: 0.50    Types: Cigarettes    Last attempt to quit: 11/26/2013  . Smokeless tobacco: Never Used  . Alcohol use Yes     Comment: binge drinking; quit Aug. 2014 very little drinking now     Allergies   Patient has no known allergies.   Review of Systems Review of Systems  Musculoskeletal: Positive for arthralgias.  Skin: Positive for wound.  Neurological: Negative for weakness and numbness.     Physical Exam Updated Vital Signs BP 112/62 (BP Location: Right Arm)   Pulse 63   Temp 98.7 F (37.1 C) (Oral)   Resp 16   Ht 5\' 2"  (1.575 m)   Wt 55.3 kg (122 lb)   LMP 06/26/2017 (Approximate)   SpO2 100%   BMI 22.31 kg/m   Physical Exam  Constitutional: She appears well-developed and well-nourished. No distress.  HENT:  Head: Normocephalic and atraumatic.  Neck: Neck supple.  Cardiovascular: Normal rate, regular rhythm and normal heart sounds.   No murmur heard. Pulmonary/Chest: Effort normal and breath sounds normal. No respiratory distress. She has no wheezes. She has no rales.  Musculoskeletal:  Tenderness to palpation of the right elbow. Decreased ROM 2/2 pain. Full Strength.   Neurological: She is alert.  Bilateral upper extremities NVI.  Skin: Skin is warm and dry.  3 cm superficial abrasion to right elbow.  Nursing note and vitals reviewed.    ED Treatments / Results  Labs (all labs ordered are listed, but only abnormal results are displayed) Labs Reviewed - No data to display  EKG  EKG Interpretation None       Radiology Dg Elbow Complete Right  Result Date: 07/27/2017 CLINICAL DATA:  Fall, right elbow pain. EXAM: RIGHT ELBOW - COMPLETE 3+ VIEW COMPARISON:  None. FINDINGS: There is no evidence of fracture, dislocation, or joint effusion. There is no evidence of arthropathy or other focal bone abnormality. Soft tissues are unremarkable. IMPRESSION: Negative. Electronically Signed   By:  Charlett NoseKevin  Dover M.D.   On: 07/27/2017 13:48    Procedures Procedures (including critical care time)  Medications Ordered in ED Medications  bacitracin ointment (1 application Topical Given 07/27/17 1506)     Initial Impression / Assessment and Plan / ED Course  I have reviewed the triage vital signs and the nursing notes.  Pertinent labs & imaging results that were available during my care of the patient were reviewed by me and considered in my medical decision making (see chart for details).    [name] is a 31 y.o. female who presents to ED for acute onset of right elbow pain after mechanical fall on concrete last night. Abrasion noted as well. Tetanus  up-to-date. Area cleaned and dressed in ED today. X-ray negative. Symptomatic home care instructions discussed. PCP or ortho follow up in 1 week if symptoms persist. Reasons to return to ER discussed. All questions answered.    Final Clinical Impressions(s) / ED Diagnoses   Final diagnoses:  Right elbow pain  Abrasion    New Prescriptions Discharge Medication List as of 07/27/2017  2:37 PM       Makih Stefanko, Chase Picket, PA-C 07/27/17 1614    Geoffery Lyons, MD 07/27/17 [phone]

## 2017-07-27 NOTE — Discharge Instructions (Signed)
It was my pleasure taking care of you today!   Tylenol as needed for pain. Keep wound clean and dry.   Follow up with your primary care provider if symptoms do not improve in 1 week.   Return to ER for new or worsening symptoms, any additional concerns.

## 2017-07-27 NOTE — ED Triage Notes (Signed)
Pt states that she fell yesterday, landing on her L elbow. Arm is now swollen. Alert and oriented.

## 2018-02-07 IMAGING — CT CT ABD-PELV W/ CM
2 of 4 series · 14 of 46 positions shown, 16 images · IV contrast (Iodine)
Comparison: CT abdomen and pelvis April 17, 2013 cyst

CLINICAL DATA: Epigastric pain. History of stomach ulcers,
gastritis, pyelonephritis.

EXAM:
CT ABDOMEN AND PELVIS WITH CONTRAST
TECHNIQUE: Multidetector CT imaging of the abdomen and pelvis was performed
using the standard protocol following bolus administration of
intravenous contrast.
CONTRAST:  75mL VUQ0X1-2KK IOPAMIDOL (VUQ0X1-2KK) INJECTION 61%

[Series 201: routine, idose (2) · axial · 0.61mm/px · z∈[-671,-356]mm · 11 of 77 slices shown, 13 images]
[im 7/77  soft-tissue]
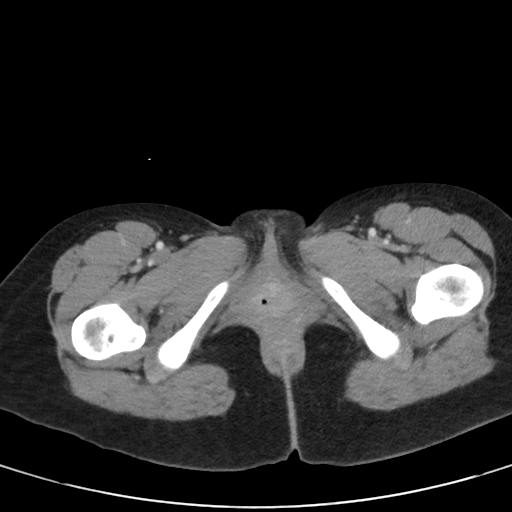
[im 7/77  bone]
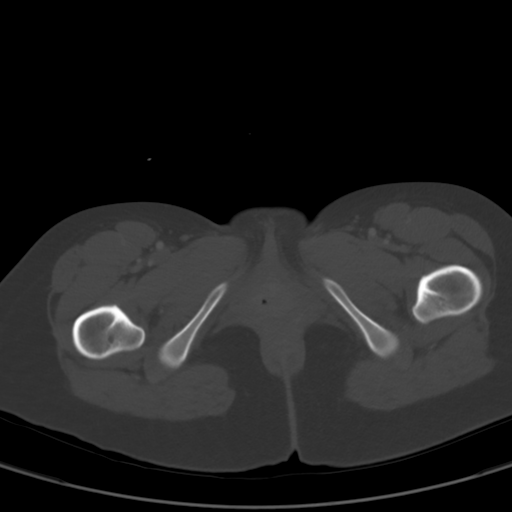
[im 13/77  soft-tissue]
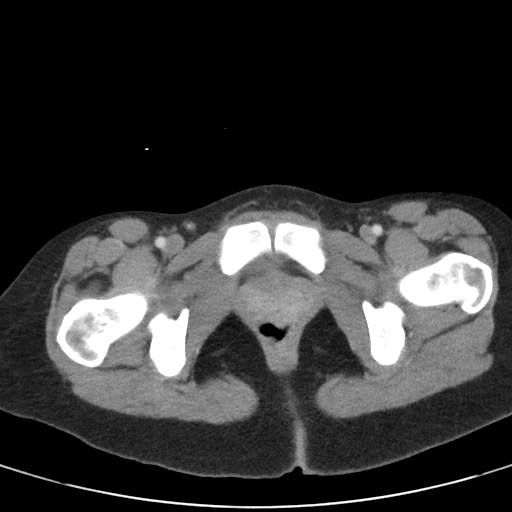
[im 19/77  soft-tissue]
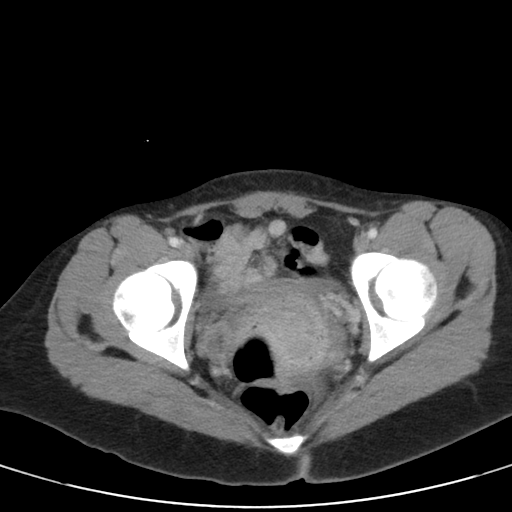
[im 25/77  soft-tissue]
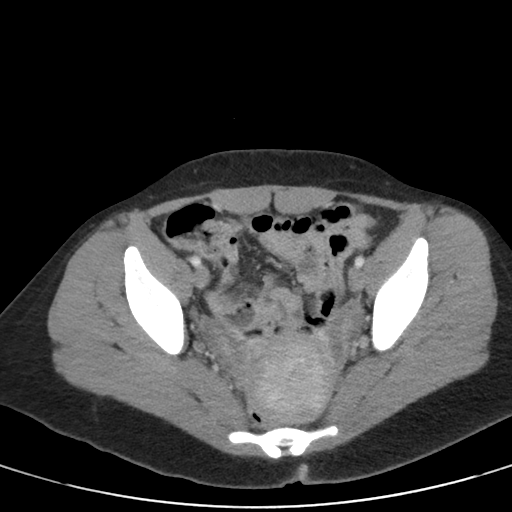
[im 31/77  soft-tissue]
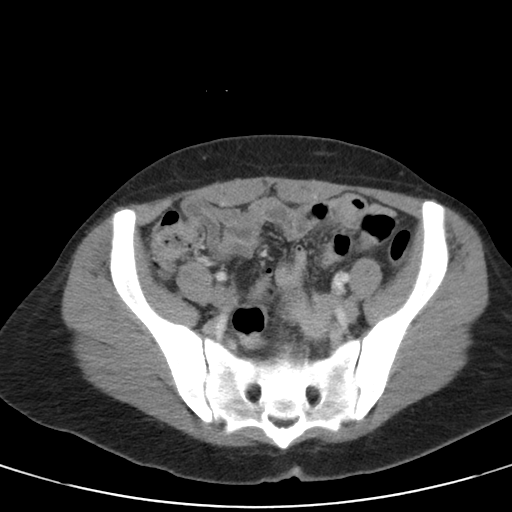
[im 40/77  soft-tissue]
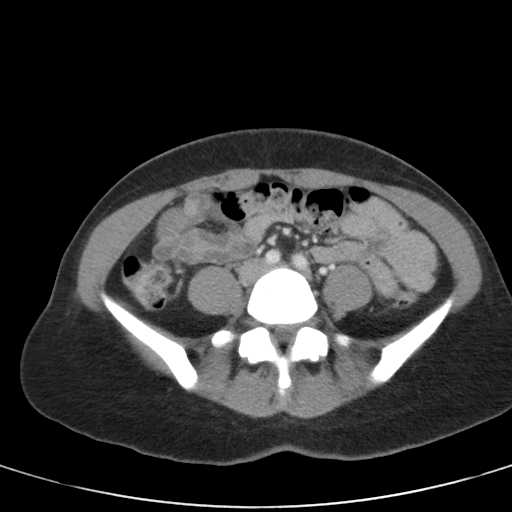
[im 46/77  soft-tissue]
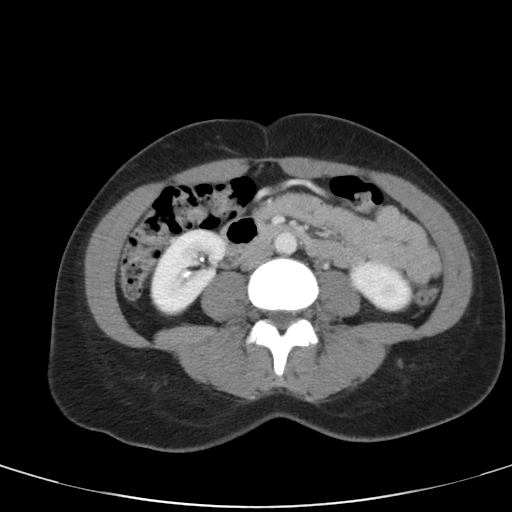
[im 52/77  soft-tissue]
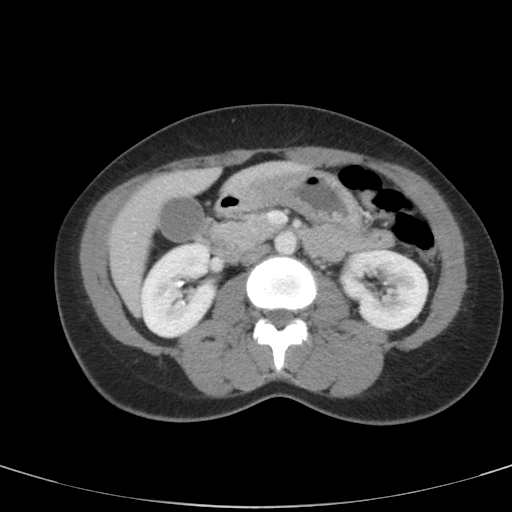
[im 58/77  soft-tissue]
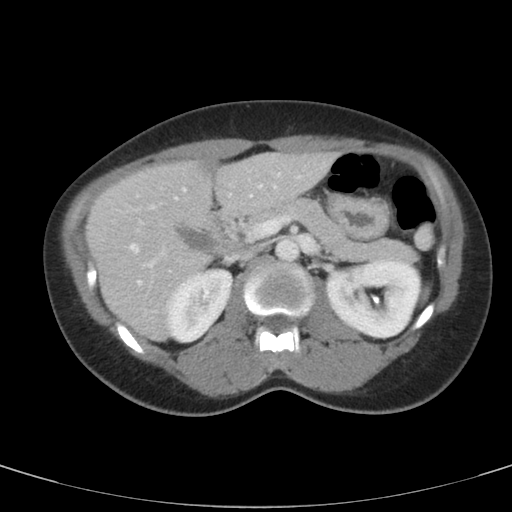
[im 58/77  bone]
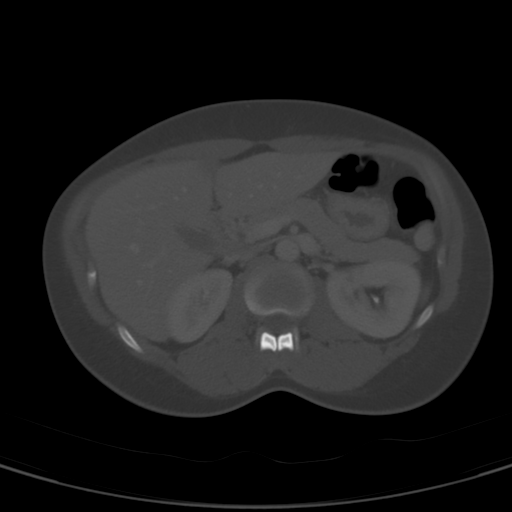
[im 64/77  soft-tissue]
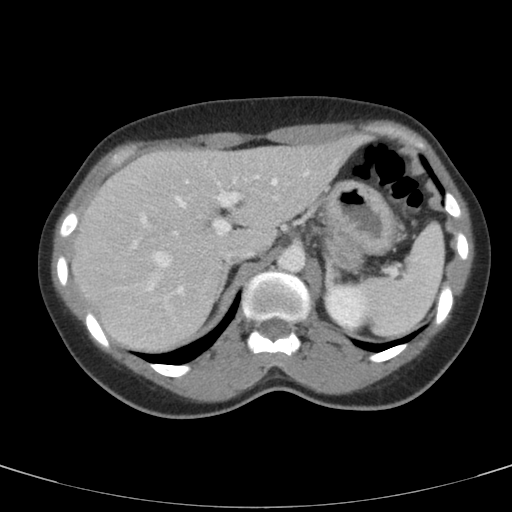
[im 70/77  soft-tissue]
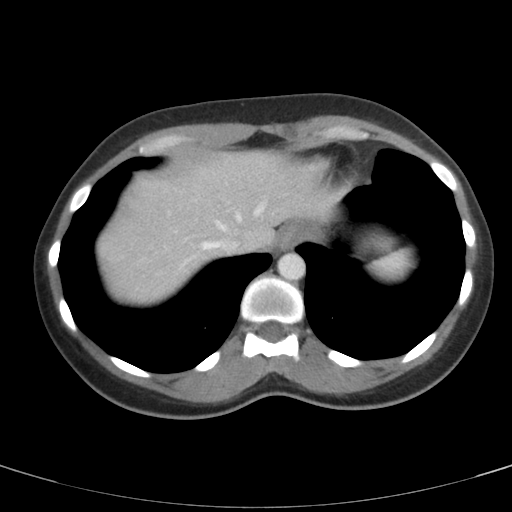

[Series 206: coronals, idose (2) · coronal · 0.45mm/px · 3 of 86 slices shown]
[im 29/86  soft-tissue]
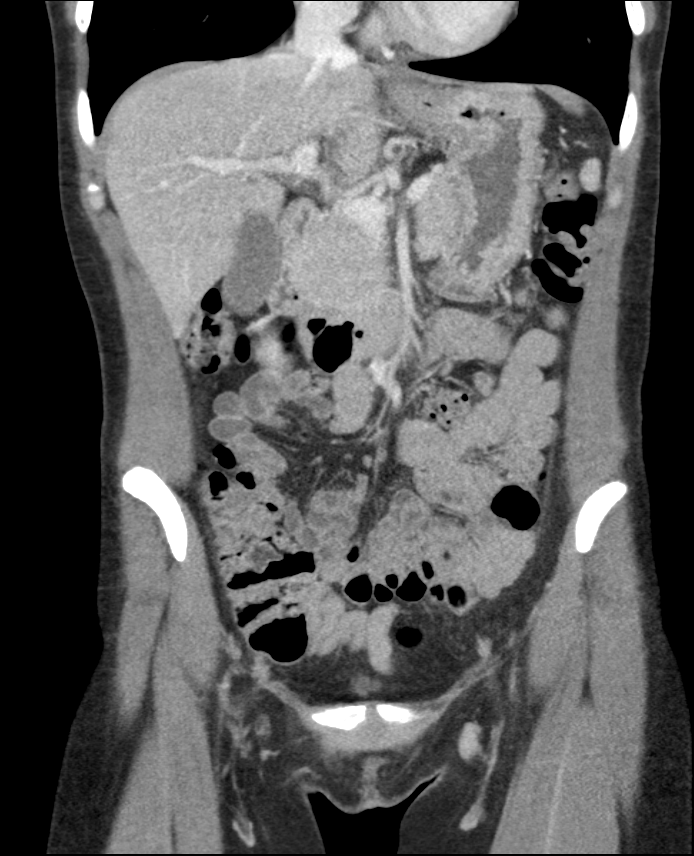
[im 38/86  soft-tissue]
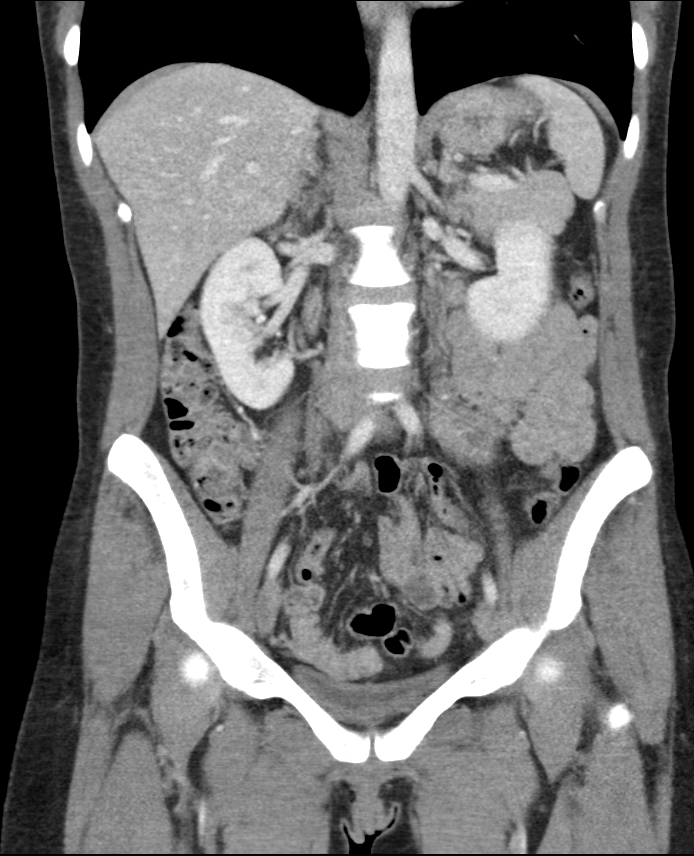
[im 48/86  soft-tissue]
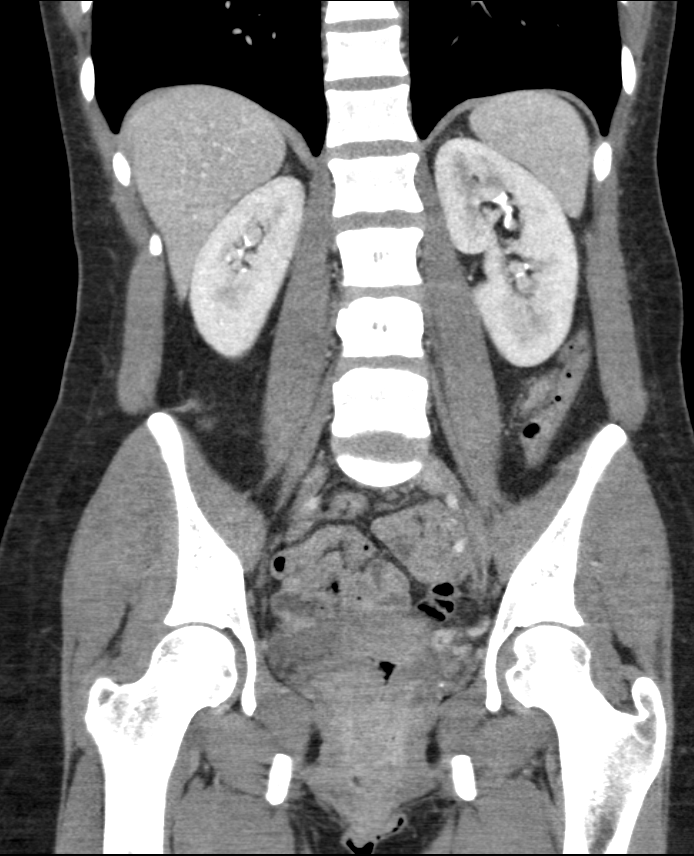

[14 of 46 positions shown; findings below may reference images not displayed]

FINDINGS: LOWER CHEST: Lung bases are clear. Included heart size is normal. No
pericardial effusion.

HEPATOBILIARY: Focal infiltration about the falciform ligament,
liver is otherwise unremarkable. Normal gallbladder.

PANCREAS: Normal.

SPLEEN: Normal.

ADRENALS/URINARY TRACT: Kidneys are orthotopic, demonstrating
symmetric enhancement. No nephrolithiasis, hydronephrosis or solid
renal masses. Early excretion of contrast limits detection of small
nonobstructing nephrolithiasis. Urinary bladder is decompressed and
unremarkable. Normal adrenal glands.

STOMACH/BOWEL: Mild stomach wall thickening greater than expected
for degree of distension. The small and large bowel are normal in
course and caliber without inflammatory changes. Normal appendix.

VASCULAR/LYMPHATIC: Aortoiliac vessels are normal in course and
caliber. No lymphadenopathy by CT size criteria.

REPRODUCTIVE: Normal.

OTHER: No intraperitoneal free fluid or free air.

MUSCULOSKELETAL: Nonacute.
IMPRESSION: Mild stomach wall thickening concerning for gastritis.

## 2018-04-15 ENCOUNTER — Emergency Department (HOSPITAL_COMMUNITY)
Admission: EM | Admit: 2018-04-15 | Discharge: 2018-04-15 | Disposition: A | Discharge status: Home / Self Care | Payer: Medicaid Other | Attending: Emergency Medicine | Admitting: Emergency Medicine

## 2018-04-15 ENCOUNTER — Emergency Department (HOSPITAL_COMMUNITY): Payer: Medicaid Other

## 2018-04-15 ENCOUNTER — Other Ambulatory Visit: Payer: Self-pay

## 2018-04-15 ENCOUNTER — Encounter (HOSPITAL_COMMUNITY): Payer: Self-pay | Admitting: Obstetrics and Gynecology

## 2018-04-15 DIAGNOSIS — Z79899 Other long term (current) drug therapy: Secondary | ICD-10-CM | POA: Insufficient documentation

## 2018-04-15 DIAGNOSIS — F17210 Nicotine dependence, cigarettes, uncomplicated: Secondary | ICD-10-CM | POA: Insufficient documentation

## 2018-04-15 DIAGNOSIS — M25511 Pain in right shoulder: Secondary | ICD-10-CM | POA: Insufficient documentation

## 2018-04-15 DIAGNOSIS — G8929 Other chronic pain: Secondary | ICD-10-CM | POA: Insufficient documentation
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
## 2018-04-15 NOTE — Discharge Instructions (Signed)
Please read instructions below. Apply ice to your shoulder for 20 minutes at a time. You can take ibuprofen/advil every 6 hours as needed for pain. Schedule an appointment with the orthopedic specialist for further evaluation of your shoulder pain. Return to the ER for new or concerning symptoms.
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
## 2018-08-21 ENCOUNTER — Encounter (HOSPITAL_COMMUNITY): Payer: Self-pay | Admitting: Emergency Medicine

## 2018-08-21 ENCOUNTER — Emergency Department (HOSPITAL_COMMUNITY)
Admission: EM | Admit: 2018-08-21 | Discharge: 2018-08-22 | Disposition: A | Discharge status: Home / Self Care | Payer: Medicaid Other | Attending: Emergency Medicine | Admitting: Emergency Medicine

## 2018-08-21 ENCOUNTER — Other Ambulatory Visit: Payer: Self-pay

## 2018-08-21 DIAGNOSIS — Z79899 Other long term (current) drug therapy: Secondary | ICD-10-CM | POA: Insufficient documentation

## 2018-08-21 DIAGNOSIS — F17210 Nicotine dependence, cigarettes, uncomplicated: Secondary | ICD-10-CM | POA: Insufficient documentation

## 2018-08-21 DIAGNOSIS — R509 Fever, unspecified: Secondary | ICD-10-CM | POA: Insufficient documentation

## 2018-08-21 NOTE — ED Triage Notes (Signed)
Pt reports having fever and chills since Sunday. Pt reports taking Tylenol with last dose at 1900. Pt denies any nausea or vomiting.

## 2018-08-22 ENCOUNTER — Emergency Department (HOSPITAL_COMMUNITY)
Admission: EM | Admit: 2018-08-22 | Discharge: 2018-08-22 | Disposition: A | Discharge status: Home / Self Care | Payer: Medicaid Other | Attending: Emergency Medicine | Admitting: Emergency Medicine

## 2018-08-22 ENCOUNTER — Encounter (HOSPITAL_COMMUNITY): Payer: Self-pay

## 2018-08-22 DIAGNOSIS — J029 Acute pharyngitis, unspecified: Secondary | ICD-10-CM | POA: Insufficient documentation

## 2018-08-22 DIAGNOSIS — B9789 Other viral agents as the cause of diseases classified elsewhere: Secondary | ICD-10-CM | POA: Insufficient documentation

## 2018-08-22 DIAGNOSIS — F17210 Nicotine dependence, cigarettes, uncomplicated: Secondary | ICD-10-CM | POA: Insufficient documentation

## 2018-08-22 LAB — GROUP A STREP BY PCR: Group A Strep by PCR: NOT DETECTED

## 2018-08-22 NOTE — ED Provider Notes (Signed)
[facility]-EMERGENCY DEPT Provider Note   CSN: [id number] Arrival date & time: 08/21/18  2038     History   Chief Complaint Chief Complaint  Patient presents with  . Fever    HPI [name] is a 32 y.o. female.  HPI 32 year old female comes in with chief complaint of fever. Patient has history of seizures, polysubstance abuse remotely, alcoholic gastritis.  She reports that she started developing a fever yesterday.  Patient has not checked her temperature, however she has been feeling hot and having chills.  Pt denies nausea, emesis, cough, chest pains, shortness of breath, headaches, neck pain, abdominal pain, uti like symptoms, rash.  No sick contacts or any tick exposures.   Past Medical History:  Diagnosis Date  . Adjustment disorder with emotional disturbance   . Alcoholic gastritis 03/2013    hospitalization  . Hematemesis 03/2013   along with alcoholic gastritis, hospitalization  . Metabolic acidosis 03/2013    hospitaliation due to alcohol abuse, dehyration, nausea/vomiting  . Polysubstance abuse (HCC)   . Pyelonephritis 03/2010   hospitalization  . Seizures (HCC)   . Stomach ulcer    never had any tests to confirm this diagni=oses    Patient Active Problem List   Diagnosis Date Noted  . Active labor 06/09/2014  . Previous cesarean section complicating pregnancy, antepartum 01/31/2014  . Supervision of normal pregnancy 11/08/2013  . Anemia 04/19/2013  . Abdominal pain, epigastric 04/17/2013  . Hematemesis- mild 04/04/2013  . Intractable nausea, vomiting & abdominal pain. 04/04/2013  . Dehydration 04/04/2013  . Hypokalemia 04/04/2013  . Tobacco abuse 04/04/2013  . Alcohol dependence (HCC) 04/04/2013  . Metabolic acidosis, increased anion gap 04/04/2013    Past Surgical History:  Procedure Laterality Date  . BREAST LUMPECTOMY Right    benign pathology  . CESAREAN SECTION  2005  . MOUTH SURGERY       OB History    Gravida    3   Para  2   Term  1   Preterm  1   AB  1   Living  2     SAB  0   TAB  1   Ectopic  0   Multiple  0   Live Births  2            Home Medications    Prior to Admission medications   Medication Sig Start Date End Date Taking? Authorizing Provider  acetaminophen (TYLENOL) 500 MG tablet Take 1,000 mg by mouth every 6 (six) hours as needed for mild pain.    [provider]  cetirizine (ZYRTEC) 10 MG tablet Take 10 mg by mouth daily as needed for allergies.    [provider]  ibuprofen (ADVIL,MOTRIN) 200 MG tablet Take 600 mg by mouth every 6 (six) hours as needed for fever.    [provider]  omeprazole (PRILOSEC) 20 MG capsule Take 1 capsule (20 mg total) by mouth daily. 01/28/17   Rancour, Jeannett Senior, MD  ondansetron (ZOFRAN) 4 MG tablet Take 1 tablet (4 mg total) by mouth every 8 (eight) hours as needed for nausea or vomiting. 01/24/17   Phineas Semen, MD  promethazine (PHENERGAN) 25 MG tablet Take 1 tablet (25 mg total) by mouth every 6 (six) hours as needed for nausea or vomiting. 01/28/17   Rancour, Jeannett Senior, MD  sucralfate (CARAFATE) 1 g tablet Take 1 tablet (1 g total) by mouth 4 (four) times daily -  with meals and at bedtime.  01/28/17   Rancour, Jeannett SeniorStephen, MD  traMADol (ULTRAM) 50 MG tablet Take 1 tablet (50 mg total) by mouth every 6 (six) hours as needed. Patient taking differently: Take 50 mg by mouth every 6 (six) hours as needed for moderate pain.  04/16/16   Leftwich-Kirby, Wilmer FloorLisa A, CNM    Family History Family History  Problem Relation Age of Onset  . Diabetes Mother   . Hypertension Father   . Diabetes Maternal Aunt   . Diabetes Maternal Uncle   . Cancer Maternal Uncle   . Diabetes Maternal Grandmother   . Cancer Maternal Grandmother   . Diabetes Maternal Grandfather     Social History Social History   Tobacco Use  . Smoking status: Current Every Day Smoker    Packs/day: 0.50    Types: Cigarettes    Last attempt to quit:  11/26/2013    Years since quitting: 4.7  . Smokeless tobacco: Never Used  Substance Use Topics  . Alcohol use: Yes    Comment: binge drinking; quit Aug. 2014 very little drinking now  . Drug use: Yes    Types: Marijuana, Cocaine     Allergies   Patient has no known allergies.   Review of Systems Review of Systems  Constitutional: Positive for activity change, chills and fever.  Respiratory: Negative for cough.   Gastrointestinal: Negative for nausea and vomiting.  Allergic/Immunologic: Negative for immunocompromised state.  Neurological: Negative for headaches.     Physical Exam Updated Vital Signs BP 123/88 (BP Location: Left Arm)   Pulse (!) 101   Temp 98.9 F (37.2 C) (Oral)   Resp 19   Ht 5\' 2"  (1.575 m)   Wt 56.7 kg   LMP 08/21/2018   SpO2 100%   BMI 22.86 kg/m   Physical Exam  Constitutional: She is oriented to person, place, and time. She appears well-developed. No distress.  HENT:  Head: Normocephalic and atraumatic.  Eyes: EOM are normal.  Neck: Normal range of motion. Neck supple.  No meningismus  Cardiovascular: Normal rate.  Pulmonary/Chest: Effort normal.  Abdominal: Bowel sounds are normal.  Lymphadenopathy:    She has no cervical adenopathy.  Neurological: She is alert and oriented to person, place, and time.  Skin: Skin is warm and dry.  Nursing note and vitals reviewed.    ED Treatments / Results  Labs (all labs ordered are listed, but only abnormal results are displayed) Labs Reviewed - No data to display  EKG None  Radiology No results found.  Procedures Procedures (including critical care time)  Medications Ordered in ED Medications - No data to display   Initial Impression / Assessment and Plan / ED Course  I have reviewed the triage vital signs and the nursing notes.  Pertinent labs & imaging results that were available during my care of the patient were reviewed by me and considered in my medical decision making (see  chart for details).     32 year old female comes in with chief complaint of fever. Review of system is negative for any specific organ system source of infection. Patient is afebrile here and her vital signs are within normal limit besides slightly elevated heart rate.  Patient's physical exam is nonfocal and reassuring.  We will discharge patient.  Final Clinical Impressions(s) / ED Diagnoses   Final diagnoses:  Fever, unspecified fever cause    ED Discharge Orders    None       Derwood KaplanNanavati, Nghia Mcentee, MD 08/22/18 [id number]

## 2018-08-22 NOTE — ED Provider Notes (Signed)
[facility]-EMERGENCY DEPT Provider Note   CSN: [id number] Arrival date & time: 08/22/18  1654     History   Chief Complaint Chief Complaint  Patient presents with  . Sore Throat    HPI [name] is a 32 y.o. female.  HPI   Pt is a 32 y/o female who presents to the ED today c/o a sore throat that began 4 days ago. Pain is rated at 8/10. Pain worse with swallowing or drinking. States that she also began to have a fever 3 days ago and has had a temp of 100.22F max. States she has had chills, sweats, postnasal drip, nausea. Reports a rare dry cough, but denies rhinorrhea, congestion, vomiting. No CP or SOB. Has tried taking tylenol, motrin, and theraflu with no significant relief.  States she was evaluated in the ED yesterday and told to return if symptoms persisted past 24 hours.  Reviewed note from yesterday.  Patient did not complain of a sore throat yesterday.  Her chief complaint was a fever.  Past Medical History:  Diagnosis Date  . Adjustment disorder with emotional disturbance   . Alcoholic gastritis 03/2013    hospitalization  . Hematemesis 03/2013   along with alcoholic gastritis, hospitalization  . Metabolic acidosis 03/2013    hospitaliation due to alcohol abuse, dehyration, nausea/vomiting  . Polysubstance abuse (HCC)   . Pyelonephritis 03/2010   hospitalization  . Seizures (HCC)   . Stomach ulcer    never had any tests to confirm this diagni=oses    Patient Active Problem List   Diagnosis Date Noted  . Active labor 06/09/2014  . Previous cesarean section complicating pregnancy, antepartum 01/31/2014  . Supervision of normal pregnancy 11/08/2013  . Anemia 04/19/2013  . Abdominal pain, epigastric 04/17/2013  . Hematemesis- mild 04/04/2013  . Intractable nausea, vomiting & abdominal pain. 04/04/2013  . Dehydration 04/04/2013  . Hypokalemia 04/04/2013  . Tobacco abuse 04/04/2013  . Alcohol dependence (HCC) 04/04/2013  . Metabolic  acidosis, increased anion gap 04/04/2013    Past Surgical History:  Procedure Laterality Date  . BREAST LUMPECTOMY Right    benign pathology  . CESAREAN SECTION  2005  . MOUTH SURGERY       OB History    Gravida  3   Para  2   Term  1   Preterm  1   AB  1   Living  2     SAB  0   TAB  1   Ectopic  0   Multiple  0   Live Births  2            Home Medications    Prior to Admission medications   Medication Sig Start Date End Date Taking? Authorizing Provider  acetaminophen (TYLENOL) 500 MG tablet Take 1,000 mg by mouth every 6 (six) hours as needed for mild pain.    [provider]  cetirizine (ZYRTEC) 10 MG tablet Take 10 mg by mouth daily as needed for allergies.    [provider]  ibuprofen (ADVIL,MOTRIN) 200 MG tablet Take 600 mg by mouth every 6 (six) hours as needed for fever.    [provider]  omeprazole (PRILOSEC) 20 MG capsule Take 1 capsule (20 mg total) by mouth daily. 01/28/17   Rancour, Jeannett Senior, MD  ondansetron (ZOFRAN) 4 MG tablet Take 1 tablet (4 mg total) by mouth every 8 (eight) hours as needed for nausea or vomiting. 01/24/17   Phineas Semen, MD  promethazine (PHENERGAN) 25 MG tablet Take 1 tablet (25 mg total) by mouth every 6 (six) hours as needed for nausea or vomiting. 01/28/17   Rancour, Jeannett Senior, MD  sucralfate (CARAFATE) 1 g tablet Take 1 tablet (1 g total) by mouth 4 (four) times daily -  with meals and at bedtime. 01/28/17   Rancour, Jeannett Senior, MD  traMADol (ULTRAM) 50 MG tablet Take 1 tablet (50 mg total) by mouth every 6 (six) hours as needed. Patient taking differently: Take 50 mg by mouth every 6 (six) hours as needed for moderate pain.  04/16/16   Leftwich-Kirby, Wilmer Floor, CNM    Family History Family History  Problem Relation Age of Onset  . Diabetes Mother   . Hypertension Father   . Diabetes Maternal Aunt   . Diabetes Maternal Uncle   . Cancer Maternal Uncle   . Diabetes Maternal Grandmother   .  Cancer Maternal Grandmother   . Diabetes Maternal Grandfather     Social History Social History   Tobacco Use  . Smoking status: Current Every Day Smoker    Packs/day: 0.50    Types: Cigarettes    Last attempt to quit: 11/26/2013    Years since quitting: 4.7  . Smokeless tobacco: Never Used  Substance Use Topics  . Alcohol use: Yes    Comment: binge drinking; quit Aug. 2014 very little drinking now  . Drug use: Yes    Types: Marijuana, Cocaine     Allergies   Patient has no known allergies.   Review of Systems Review of Systems  Constitutional: Positive for chills, diaphoresis and fever.  HENT: Positive for postnasal drip and sore throat. Negative for congestion and rhinorrhea.   Eyes: Negative for visual disturbance.  Respiratory: Positive for cough.   Gastrointestinal: Positive for nausea. Negative for constipation, diarrhea and vomiting.  Genitourinary: Negative for dysuria, frequency and urgency.  Musculoskeletal: Negative for back pain and neck pain.  Skin: Negative for rash.  Neurological: Negative for dizziness, weakness, light-headedness, numbness and headaches.   Physical Exam Updated Vital Signs BP (!) 129/91 (BP Location: Left Arm)   Pulse 87   Temp 99.7 F (37.6 C) (Oral)   Resp 18   LMP 08/21/2018   SpO2 100%   Physical Exam  Constitutional: She appears well-developed and well-nourished.  Non-toxic appearance. No distress.  HENT:  Head: Normocephalic and atraumatic.  Bilateral TMs without erythema or effusion.  No significant pharyngeal erythema.  No tonsillar swelling or exudates.  Uvula midline.  Tolerating secretions with normal voice.  Eyes: Pupils are equal, round, and reactive to light. Conjunctivae and EOM are normal.  Neck: Neck supple.  No nuchal rigidity.  No cervical adenopathy.  Cardiovascular: Normal rate, regular rhythm, normal heart sounds and intact distal pulses.  No murmur heard. Pulmonary/Chest: Effort normal and breath sounds  normal. No respiratory distress. She has no wheezes. She has no rhonchi. She has no rales.  Abdominal: Soft. Bowel sounds are normal. She exhibits no distension. There is no tenderness.  Musculoskeletal: She exhibits no edema.  Neurological: She is alert.  Skin: Skin is warm and dry. Capillary refill takes less than 2 seconds.  Psychiatric: She has a normal mood and affect.  Nursing note and vitals reviewed.  ED Treatments / Results  Labs (all labs ordered are listed, but only abnormal results are displayed) Labs Reviewed  GROUP A STREP BY PCR    EKG None  Radiology No results found.  Procedures Procedures (including critical care time)  Medications Ordered in ED Medications - No data to display   Initial Impression / Assessment and Plan / ED Course  I have reviewed the triage vital signs and the nursing notes.  Pertinent labs & imaging results that were available during my care of the patient were reviewed by me and considered in my medical decision making (see chart for details).   Final Clinical Impressions(s) / ED Diagnoses   Final diagnoses:  Viral pharyngitis   Pt afebrile without tonsillar exudate, negative strep. Presents with no cervical lymphadenopathy, & mild dysphagia; diagnosis of viral pharyngitis. No abx indicated. DC w symptomatic tx for pain  Pt does not appear dehydrated, but did discuss importance of water rehydration. Presentation non concerning for PTA or infxn spread to soft tissue. No trismus or uvula deviation. Specific return precautions discussed. Pt able to drink water in ED without difficulty with intact air way. Recommended PCP follow up.  ED Discharge Orders    None       Rayne DuCouture, Rayne Loiseau S, PA-C 08/22/18 1900    Terrilee FilesButler, Michael C, MD 08/23/18 [phone]

## 2018-08-22 NOTE — ED Triage Notes (Signed)
Pt reports that she has been has having fever x  3 says. Pt reports sore throat 8/10 no redness or swelling is noted. Pt reports poor appetite, and mild nausea. Pt was seen here yesterday but has not had any relief.

## 2018-08-22 NOTE — Discharge Instructions (Signed)
You may alternate taking Tylenol and Ibuprofen as needed for pain control. You may take 400-600 mg of ibuprofen every 6 hours and [phone] mg of Tylenol every 6 hours. Do not exceed 4000 mg of Tylenol daily as this can lead to liver damage. Also, make sure to take Ibuprofen with meals as it can cause an upset stomach. Do not take other NSAIDs while taking Ibuprofen such as (Aleve, Naprosyn, Aspirin, Celebrex, etc) and do not take more than the prescribed dose as this can lead to ulcers and bleeding in your GI tract. You may use warm and cold compresses to help with your symptoms.   Please follow up with your primary doctor within the next 3-4 days for re-evaluation and further treatment of your symptoms.   Please return to the ER sooner if you have any new or worsening symptoms.

## 2018-12-13 ENCOUNTER — Emergency Department (HOSPITAL_COMMUNITY): Payer: Medicaid Other

## 2018-12-13 ENCOUNTER — Encounter (HOSPITAL_COMMUNITY): Payer: Self-pay | Admitting: *Deleted

## 2018-12-13 ENCOUNTER — Emergency Department (HOSPITAL_COMMUNITY)
Admission: EM | Admit: 2018-12-13 | Discharge: 2018-12-13 | Disposition: A | Discharge status: Home / Self Care | Payer: Medicaid Other | Attending: Emergency Medicine | Admitting: Emergency Medicine

## 2018-12-13 DIAGNOSIS — B349 Viral infection, unspecified: Secondary | ICD-10-CM | POA: Insufficient documentation

## 2018-12-13 DIAGNOSIS — R0602 Shortness of breath: Secondary | ICD-10-CM | POA: Insufficient documentation

## 2018-12-13 DIAGNOSIS — D72819 Decreased white blood cell count, unspecified: Secondary | ICD-10-CM | POA: Insufficient documentation

## 2018-12-13 DIAGNOSIS — R059 Cough, unspecified: Secondary | ICD-10-CM

## 2018-12-13 DIAGNOSIS — H7101 Cholesteatoma of attic, right ear: Secondary | ICD-10-CM | POA: Insufficient documentation

## 2018-12-13 DIAGNOSIS — R05 Cough: Secondary | ICD-10-CM | POA: Insufficient documentation

## 2018-12-13 DIAGNOSIS — F17210 Nicotine dependence, cigarettes, uncomplicated: Secondary | ICD-10-CM | POA: Insufficient documentation

## 2018-12-13 LAB — CBC WITH DIFFERENTIAL/PLATELET
Abs Immature Granulocytes: 0.01 10*3/uL (ref 0.00–0.07)
BASOS ABS: 0 10*3/uL (ref 0.0–0.1)
BASOS PCT: 0 %
EOS ABS: 0 10*3/uL (ref 0.0–0.5)
EOS PCT: 0 %
HEMATOCRIT: 33.9 % — AB (ref 36.0–46.0)
Hemoglobin: 11 g/dL — ABNORMAL LOW (ref 12.0–15.0)
IMMATURE GRANULOCYTES: 0 %
LYMPHS ABS: 0.9 10*3/uL (ref 0.7–4.0)
Lymphocytes Relative: 40 %
MCH: 28.6 pg (ref 26.0–34.0)
MCHC: 32.4 g/dL (ref 30.0–36.0)
MCV: 88.3 fL (ref 80.0–100.0)
MONOS PCT: 9 %
Monocytes Absolute: 0.2 10*3/uL (ref 0.1–1.0)
NEUTROS PCT: 51 %
NRBC: 0 % (ref 0.0–0.2)
Neutro Abs: 1.1 10*3/uL — ABNORMAL LOW (ref 1.7–7.7)
PLATELETS: 248 10*3/uL (ref 150–400)
RBC: 3.84 MIL/uL — ABNORMAL LOW (ref 3.87–5.11)
RDW: 18.3 % — AB (ref 11.5–15.5)
WBC: 2.3 10*3/uL — ABNORMAL LOW (ref 4.0–10.5)

## 2018-12-13 LAB — BASIC METABOLIC PANEL
ANION GAP: 13 (ref 5–15)
BUN: 7 mg/dL (ref 6–20)
CALCIUM: 9 mg/dL (ref 8.9–10.3)
CO2: 23 mmol/L (ref 22–32)
CREATININE: 0.82 mg/dL (ref 0.44–1.00)
Chloride: 100 mmol/L (ref 98–111)
GFR calc non Af Amer: >60 mL/min (ref >60)
Glucose, Bld: 85 mg/dL (ref 70–99)
Potassium: 3.7 mmol/L (ref 3.5–5.1)
SODIUM: 136 mmol/L (ref 135–145)

## 2018-12-13 LAB — URINALYSIS, ROUTINE W REFLEX MICROSCOPIC
Bilirubin Urine: NEGATIVE
Glucose, UA: NEGATIVE mg/dL
Hgb urine dipstick: NEGATIVE
KETONES UR: 20 mg/dL — AB
LEUKOCYTES UA: NEGATIVE
Nitrite: NEGATIVE
PROTEIN: NEGATIVE mg/dL
Specific Gravity, Urine: 1.027 (ref 1.005–1.030)
pH: 5 (ref 5.0–8.0)

## 2018-12-13 LAB — POC URINE PREG, ED: Preg Test, Ur: NEGATIVE

## 2018-12-13 MED ORDER — FLUTICASONE PROPIONATE 50 MCG/ACT NA SUSP: 1.0000 | Freq: Every day | NASAL | 2 refills | Status: DC

## 2018-12-13 MED ORDER — BENZONATATE 100 MG PO CAPS: 100.0000 mg | ORAL_CAPSULE | Freq: Three times a day (TID) | ORAL | 0 refills | Status: DC

## 2018-12-13 MED ORDER — IBUPROFEN 200 MG PO TABS
600.0000 mg | ORAL_TABLET | Freq: Once | ORAL | Status: AC
  Administered 2018-12-13: 600 mg via ORAL
  Filled 2018-12-13: qty 1

## 2018-12-13 MED ORDER — DM-GUAIFENESIN ER 30-600 MG PO TB12: 1.0000 | ORAL_TABLET | Freq: Two times a day (BID) | ORAL | 0 refills | Status: AC

## 2018-12-13 MED ORDER — ALBUTEROL SULFATE HFA 108 (90 BASE) MCG/ACT IN AERS
1.0000 | INHALATION_SPRAY | Freq: Once | RESPIRATORY_TRACT | Status: AC
  Administered 2018-12-13: 1 via RESPIRATORY_TRACT
  Filled 2018-12-13: qty 6.7

## 2018-12-13 MED ORDER — AEROCHAMBER PLUS FLO-VU MEDIUM MISC
1.0000 | Freq: Once | Status: DC
  Filled 2018-12-13: qty 1

## 2018-12-13 MED ORDER — ACETAMINOPHEN 325 MG PO TABS
650.0000 mg | ORAL_TABLET | Freq: Once | ORAL | Status: AC | PRN
  Administered 2018-12-13: 650 mg via ORAL
  Filled 2018-12-13: qty 2

## 2018-12-13 MED ORDER — SODIUM CHLORIDE 0.9 % IV BOLUS
1000.0000 mL | Freq: Once | INTRAVENOUS | Status: AC
  Administered 2018-12-13: 1000 mL via INTRAVENOUS

## 2018-12-13 MED ORDER — OPTICHAMBER DIAMOND MISC
Freq: Once | Status: DC
  Filled 2018-12-13: qty 1

## 2018-12-13 NOTE — ED Provider Notes (Signed)
  Face-to-face evaluation   History: She complains of URI symptoms for 4 days, preventing her from being able to work.  She is occasionally producing sputum with cough.  She denies weakness or dizziness.  Physical exam: Alert, calm, cooperative.  Oropharynx moist without lesions.  Neck supple.  Lungs clear.  Patient with cough during exam, nonproductive.  Medical screening examination/treatment/procedure(s) were conducted as a shared visit with non-physician practitioner(s) and myself.  I personally evaluated the patient during the encounter   Mancel BaleWentz, Aithana Kushner, MD 12/14/18 [id number]

## 2018-12-13 NOTE — ED Triage Notes (Signed)
Pt in c/o cough and congestion, states cough is productive, started on Sunday, unsure of fever at home but reports body aches and chills

## 2018-12-13 NOTE — ED Notes (Signed)
Pt given gingerale and husband went to get her soup

## 2018-12-13 NOTE — ED Provider Notes (Signed)
[facility] EMERGENCY DEPARTMENT Provider Note   CSN: [id number] Arrival date & time: 12/13/18  1240     History   Chief Complaint Chief Complaint  Patient presents with  . URI    HPI [name] is a 32 y.o. female.  HPI  Patient is a 32 year old female with a history of polysubstance use, alcohol use, and gastritis presenting for fever, myalgias, cough, and congestion.  Patient positive symptoms began 72 hours ago.  She reports that the cough has become productive of sputum but no hemoptysis.  She reports that she began experiencing myalgias last night.  Patient did not take her temperature at home but felt febrile.  Patient denies any shortness of breath or difficulty breathing.  She does report that her chest hurts when she coughs.  She does report that she had some posttussive nausea but no emesis.  Patient has had poor appetite.  Patient is urinary frequency and urgency.  Denies dysuria.  She reports some left flank pain.  She reports thatthis may feel similar to when she had pyelonephritis. Patient reports that she smokes approximately pack a day, denies history of asthma or other lung disease.  Denies history immunocompromise status.  Past Medical History:  Diagnosis Date  . Adjustment disorder with emotional disturbance   . Alcoholic gastritis 03/2013    hospitalization  . Hematemesis 03/2013   along with alcoholic gastritis, hospitalization  . Metabolic acidosis 03/2013    hospitaliation due to alcohol abuse, dehyration, nausea/vomiting  . Polysubstance abuse (HCC)   . Pyelonephritis 03/2010   hospitalization  . Seizures (HCC)   . Stomach ulcer    never had any tests to confirm this diagni=oses    Patient Active Problem List   Diagnosis Date Noted  . Active labor 06/09/2014  . Previous cesarean section complicating pregnancy, antepartum 01/31/2014  . Supervision of normal pregnancy 11/08/2013  . Anemia 04/19/2013  . Abdominal pain,  epigastric 04/17/2013  . Hematemesis- mild 04/04/2013  . Intractable nausea, vomiting & abdominal pain. 04/04/2013  . Dehydration 04/04/2013  . Hypokalemia 04/04/2013  . Tobacco abuse 04/04/2013  . Alcohol dependence (HCC) 04/04/2013  . Metabolic acidosis, increased anion gap 04/04/2013    Past Surgical History:  Procedure Laterality Date  . BREAST LUMPECTOMY Right    benign pathology  . CESAREAN SECTION  2005  . MOUTH SURGERY       OB History    Gravida  3   Para  2   Term  1   Preterm  1   AB  1   Living  2     SAB  0   TAB  1   Ectopic  0   Multiple  0   Live Births  2            Home Medications    Prior to Admission medications   Medication Sig Start Date End Date Taking? Authorizing Provider  omeprazole (PRILOSEC) 20 MG capsule Take 1 capsule (20 mg total) by mouth daily. Patient not taking: Reported on 12/13/2018 01/28/17   Glynn Octave, MD  sucralfate (CARAFATE) 1 g tablet Take 1 tablet (1 g total) by mouth 4 (four) times daily -  with meals and at bedtime. Patient not taking: Reported on 12/13/2018 01/28/17   Glynn Octave, MD    Family History Family History  Problem Relation Age of Onset  . Diabetes Mother   . Hypertension Father   . Diabetes Maternal Aunt   .  Diabetes Maternal Uncle   . Cancer Maternal Uncle   . Diabetes Maternal Grandmother   . Cancer Maternal Grandmother   . Diabetes Maternal Grandfather     Social History Social History   Tobacco Use  . Smoking status: Current Every Day Smoker    Packs/day: 0.50    Types: Cigarettes    Last attempt to quit: 11/26/2013    Years since quitting: 5.0  . Smokeless tobacco: Never Used  Substance Use Topics  . Alcohol use: Yes    Comment: binge drinking; quit Aug. 2014 very little drinking now  . Drug use: Yes    Types: Marijuana, Cocaine     Allergies   Patient has no known allergies.   Review of Systems Review of Systems  Constitutional: Negative for chills and  fever.  HENT: Positive for congestion and rhinorrhea. Negative for sinus pain and sore throat.   Eyes: Negative for visual disturbance.  Respiratory: Positive for shortness of breath. Negative for cough, chest tightness and wheezing.   Cardiovascular: Negative for chest pain, palpitations and leg swelling.  Gastrointestinal: Negative for abdominal pain, nausea and vomiting.  Genitourinary: Negative for dysuria and flank pain.  Musculoskeletal: Positive for myalgias. Negative for back pain.  Skin: Negative for rash.  Neurological: Negative for dizziness, syncope, light-headedness and headaches.     Physical Exam Updated Vital Signs BP 120/86 (BP Location: Right Arm)   Pulse (!) 109   Temp 100 F (37.8 C) (Oral)   Resp 20   LMP 11/26/2018   SpO2 100%   Physical Exam Vitals signs and nursing note reviewed.  Constitutional:      General: She is not in acute distress.    Appearance: She is well-developed.  HENT:     Head: Normocephalic and atraumatic.     Comments: Right TM with solid structure anterior aspect of TM.  No effusion or erythema of right TM. Left tympanic membrane without erythema or edema. No effusion.  Eyes:     Conjunctiva/sclera: Conjunctivae normal.     Pupils: Pupils are equal, round, and reactive to light.  Neck:     Musculoskeletal: Normal range of motion and neck supple.  Cardiovascular:     Rate and Rhythm: Regular rhythm. Tachycardia present.     Heart sounds: S1 normal and S2 normal. No murmur.  Pulmonary:     Effort: Pulmonary effort is normal.     Breath sounds: Normal breath sounds. No wheezing or rales.  Abdominal:     General: There is no distension.     Palpations: Abdomen is soft.     Tenderness: There is no abdominal tenderness. There is no guarding.     Comments: +TTP of left flank.   Musculoskeletal: Normal range of motion.        General: No deformity.  Lymphadenopathy:     Cervical: No cervical adenopathy.  Skin:    General: Skin is  warm and dry.     Findings: No erythema or rash.  Neurological:     Mental Status: She is alert.     Comments: Cranial nerves grossly intact. Patient moves extremities symmetrically and with good coordination.  Psychiatric:        Behavior: Behavior normal.        Thought Content: Thought content normal.        Judgment: Judgment normal.      ED Treatments / Results  Labs (all labs ordered are listed, but only abnormal results are displayed) Labs Reviewed  URINALYSIS, ROUTINE W REFLEX MICROSCOPIC - Abnormal; Notable for the following components:      Result Value   APPearance HAZY (*)    Ketones, ur 20 (*)    All other components within normal limits  CBC WITH DIFFERENTIAL/PLATELET - Abnormal; Notable for the following components:   WBC 2.3 (*)    RBC 3.84 (*)    Hemoglobin 11.0 (*)    HCT 33.9 (*)    RDW 18.3 (*)    Neutro Abs 1.1 (*)    All other components within normal limits  BASIC METABOLIC PANEL  POC URINE PREG, ED    EKG None  Radiology Dg Chest 2 View  Result Date: 12/13/2018 CLINICAL DATA:  Fever, cough. EXAM: CHEST - 2 VIEW COMPARISON:  Radiographs of August 29, 2008. FINDINGS: The heart size and mediastinal contours are within normal limits. Both lungs are clear. The visualized skeletal structures are unremarkable. IMPRESSION: No active cardiopulmonary disease. Electronically Signed   By: Lupita Raider, M.D.   On: 12/13/2018 13:09    Procedures Procedures (including critical care time)  Medications Ordered in ED Medications  sodium chloride 0.9 % bolus 1,000 mL (1,000 mLs Intravenous New Bag/Given 12/13/18 1405)  ibuprofen (ADVIL,MOTRIN) tablet 600 mg (has no administration in time range)  acetaminophen (TYLENOL) tablet 650 mg (650 mg Oral Given 12/13/18 1250)     Initial Impression / Assessment and Plan / ED Course  I have reviewed the triage vital signs and the nursing notes.  Pertinent labs & imaging results that were available during my  care of the patient were reviewed by me and considered in my medical decision making (see chart for details).  Clinical Course as of Dec 13 1541  Wed Dec 13, 2018  1352 Temp 100.9 on my eval.   [AM]  1541 Likely postviral response. Not suspicious of acute HIV at this time. Discussed with attending physician.   WBC(!): 2.3 [AM]    Clinical Course User Index [AM] Elisha Ponder, PA-C    Patient nontoxic-appearing but febrile neutropenia to 100.9.  This is after antipyretics.  Normal lung exam.  Patient has benign abdomen.  Patient did have tenderness to palpation of the left flank.  This includes influenza, other viral process, pneumonia, pyelonephritis.  Do not suspect pulmonary embolism.,  Tachycardia likely related to fever, tachycardia and shortness of breath.  Patient left flank pain is lower than expected with lung affected by PE.  Patient not on hormones, or with with any recent hospitalizations or surgery.   Chest x-ray shows no acute abnormality.  Lung exam not consistent with pneumonia.  CBC is demonstrating leukopenia but no neutropenia.  Stable anemia.  I discussed this with the patient this is possibly a post viral response, however recommended recheck by primary care provider in 1 month.  Case discussed and patient independently evaluated by Dr. Mancel Bale. Recommended PCP follow up for CBC.   We will treat with symptomatic therapy.  Return precautions given for any shortness of breath, worsening fevers are resolving with antipyretics, or any new or worsening symptoms.  Patient is in understanding and agrees with plan of care.  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  Will refer to ENT.  Final Clinical Impressions(s) / ED Diagnoses   Final diagnoses:  Viral syndrome  Cough  Leukopenia, unspecified type    ED Discharge Orders         Ordered    fluticasone (FLONASE) 50 MCG/ACT  nasal spray  Daily     12/13/18 1612     dextromethorphan-guaiFENesin (MUCINEX DM) 30-600 MG 12hr tablet  2 times daily     12/13/18 1612    benzonatate (TESSALON) 100 MG capsule  Every 8 hours     12/13/18 1612           Elisha Ponder, PA-C 12/13/18 1615    Mancel Bale, MD 12/14/18 [phone]

## 2018-12-13 NOTE — Discharge Instructions (Signed)
Please read and follow all provided instructions.  Your diagnoses today include:  1. Viral syndrome   2. Cough   3. Leukopenia, unspecified type     You appear to have an upper respiratory infection (URI). An upper respiratory tract infection, or cold, is a viral infection of the air passages leading to the lungs. It should improve gradually after 5-7 days. You may have a lingering cough that lasts for 2- 4 weeks after the infection.  Tests performed today include: Vital signs. See below for your results today.   Medications prescribed:   Take any prescribed medications only as directed. Treatment for your infection is aimed at treating the symptoms. There are no medications, such as antibiotics, that will cure your infection.   You are prescribed Tessalon Perles.  You may take these every 8 hours as needed to suppress cough.  These are toxic to children overdose, so please keep them away from children.  You prescribe Mucinex DM.  This can be taken twice a day to help suppress cough and loosen the mucus in your lungs.  You are prescribed Flonase.  Please place 1 spray in each nostril daily in the morning.  Use your inhaler every 4-6 hours as needed for coughing spells.   Home care instructions:  Follow any educational materials contained in this packet.   Your illness is contagious and can be spread to others, especially during the first 3 or 4 days. It cannot be cured by antibiotics or other medicines. Take basic precautions such as washing your hands often, covering your mouth when you cough or sneeze, and avoiding public places where you could spread your illness to others.   Please continue drinking plenty of fluids.  Use over-the-counter medicines as needed as directed on packaging for symptom relief.  You may also use ibuprofen or tylenol as directed on packaging for pain or fever.  Do not take multiple medicines containing Tylenol or acetaminophen to avoid taking too much of this  medication.  Follow-up instructions: Please follow-up with your primary care provider in the next 3 days for further evaluation of your symptoms if you are not feeling better.   Return instructions:  Please return to the Emergency Department if you experience worsening symptoms.  RETURN IMMEDIATELY IF you develop shortness of breath, chest pain, confusion or altered mental status, a new rash, become dizzy, faint, or poorly responsive, or are unable to be cared for at home. Please return if you have persistent vomiting and cannot keep down fluids or develop a fever that is not controlled by tylenol or motrin.   Please return if you have any other emergent concerns.  Additional Information:  Your vital signs today were: BP 111/74    Pulse 88    Temp 98.9 F (37.2 C) (Oral)    Resp 18    LMP 11/26/2018    SpO2 99%  If your blood pressure (BP) was elevated above 135/85 this visit, please have this repeated by your doctor within one month. --------------

## 2020-07-06 ENCOUNTER — Emergency Department
Admission: EM | Admit: 2020-07-06 | Discharge: 2020-07-06 | Disposition: A | Discharge status: Home / Self Care | Payer: Medicaid Other | Attending: Emergency Medicine | Admitting: Emergency Medicine

## 2020-07-06 ENCOUNTER — Encounter: Payer: Self-pay | Admitting: Emergency Medicine

## 2020-07-06 ENCOUNTER — Other Ambulatory Visit: Payer: Self-pay

## 2020-07-06 ENCOUNTER — Emergency Department: Payer: Medicaid Other

## 2020-07-06 DIAGNOSIS — Z20822 Contact with and (suspected) exposure to covid-19: Secondary | ICD-10-CM | POA: Insufficient documentation

## 2020-07-06 DIAGNOSIS — F17210 Nicotine dependence, cigarettes, uncomplicated: Secondary | ICD-10-CM | POA: Insufficient documentation

## 2020-07-06 DIAGNOSIS — J069 Acute upper respiratory infection, unspecified: Secondary | ICD-10-CM | POA: Insufficient documentation

## 2020-07-06 DIAGNOSIS — J029 Acute pharyngitis, unspecified: Secondary | ICD-10-CM | POA: Insufficient documentation

## 2020-07-06 LAB — SARS CORONAVIRUS 2 BY RT PCR (HOSPITAL ORDER, PERFORMED IN ~~LOC~~ HOSPITAL LAB): SARS Coronavirus 2: NEGATIVE

## 2020-07-06 LAB — GROUP A STREP BY PCR: Group A Strep by PCR: NOT DETECTED

## 2020-07-06 MED ORDER — PREDNISONE 20 MG PO TABS: 20.0000 mg | ORAL_TABLET | Freq: Two times a day (BID) | ORAL | 0 refills | Status: AC

## 2020-07-06 MED ORDER — BENZONATATE 100 MG PO CAPS: ORAL_CAPSULE | ORAL | 0 refills | Status: DC

## 2020-07-06 MED ORDER — DEXAMETHASONE SODIUM PHOSPHATE 10 MG/ML IJ SOLN
10.0000 mg | Freq: Once | INTRAMUSCULAR | Status: AC
  Administered 2020-07-06: 10 mg via INTRAMUSCULAR
  Filled 2020-07-06: qty 1

## 2020-07-06 NOTE — ED Provider Notes (Signed)
[facility] Emergency Department Provider Note ____________________________________________  Time seen: 1510  I have reviewed the triage vital signs and the nursing notes.  HISTORY  Chief Complaint  Cough and Sore Throat  HPI [name] is a 34 y.o. female presents herself to the ED with a 2-week complaint of intermittent sore throat, cough, congestion.   Patient denies any fever, chills, sweats, nausea, vomiting, chest pain.  She also denies any sick contacts, travel or other high risk exposures.  She has not taken any medications in the interim for symptom relief.  Past Medical History:  Diagnosis Date  . Adjustment disorder with emotional disturbance   . Alcoholic gastritis 03/2013    hospitalization  . Hematemesis 03/2013   along with alcoholic gastritis, hospitalization  . Metabolic acidosis 03/2013    hospitaliation due to alcohol abuse, dehyration, nausea/vomiting  . Polysubstance abuse (HCC)   . Pyelonephritis 03/2010   hospitalization  . Seizures (HCC)   . Stomach ulcer    never had any tests to confirm this diagni=oses    Patient Active Problem List   Diagnosis Date Noted  . Active labor 06/09/2014  . Previous cesarean section complicating pregnancy, antepartum 01/31/2014  . Supervision of normal pregnancy 11/08/2013  . Anemia 04/19/2013  . Abdominal pain, epigastric 04/17/2013  . Hematemesis- mild 04/04/2013  . Intractable nausea, vomiting & abdominal pain. 04/04/2013  . Dehydration 04/04/2013  . Hypokalemia 04/04/2013  . Tobacco abuse 04/04/2013  . Alcohol dependence (HCC) 04/04/2013  . Metabolic acidosis, increased anion gap 04/04/2013    Past Surgical History:  Procedure Laterality Date  . BREAST LUMPECTOMY Right    benign pathology  . CESAREAN SECTION  2005  . MOUTH SURGERY      Prior to Admission medications   Medication Sig Start Date End Date Taking? Authorizing Provider  benzonatate (TESSALON PERLES) 100 MG capsule  Take 1-2 tabs TID prn cough 07/06/20   Maedell Hedger, Charlesetta Ivory, PA-C  predniSONE (DELTASONE) 20 MG tablet Take 1 tablet (20 mg total) by mouth 2 (two) times daily with a meal for 5 days. 07/06/20 07/11/20  Zaxton Angerer, Charlesetta Ivory, PA-C  fluticasone (FLONASE) 50 MCG/ACT nasal spray Place 1 spray into both nostrils daily. 12/13/18 07/06/20  Aviva Kluver B, PA-C  omeprazole (PRILOSEC) 20 MG capsule Take 1 capsule (20 mg total) by mouth daily. Patient not taking: Reported on 12/13/2018 01/28/17 07/06/20  Glynn Octave, MD  sucralfate (CARAFATE) 1 g tablet Take 1 tablet (1 g total) by mouth 4 (four) times daily -  with meals and at bedtime. Patient not taking: Reported on 12/13/2018 01/28/17 07/06/20  Glynn Octave, MD    Allergies Patient has no known allergies.  Family History  Problem Relation Age of Onset  . Diabetes Mother   . Hypertension Father   . Diabetes Maternal Aunt   . Diabetes Maternal Uncle   . Cancer Maternal Uncle   . Diabetes Maternal Grandmother   . Cancer Maternal Grandmother   . Diabetes Maternal Grandfather     Social History Social History   Tobacco Use  . Smoking status: Current Every Day Smoker    Packs/day: 0.50    Types: Cigarettes    Last attempt to quit: 11/26/2013    Years since quitting: 6.6  . Smokeless tobacco: Never Used  Substance Use Topics  . Alcohol use: Yes    Comment: binge drinking; quit Aug. 2014 very little drinking now  . Drug use: Yes    Types: Marijuana,  Cocaine    Review of Systems  Constitutional: Negative for fever. Eyes: Negative for visual changes. ENT: Positive for sore throat. Cardiovascular: Negative for chest pain. Respiratory: Negative for shortness of breath.  Reports cough as above. Gastrointestinal: Negative for abdominal pain, vomiting and diarrhea. Musculoskeletal: Negative for back pain. Skin: Negative for rash. Neurological: Negative for headaches, focal weakness or  numbness. ____________________________________________  PHYSICAL EXAM:  VITAL SIGNS: ED Triage Vitals  Enc Vitals Group     BP 07/06/20 1404 114/71     Pulse Rate 07/06/20 1404 88     Resp 07/06/20 1404 16     Temp 07/06/20 1404 98.7 F (37.1 C)     Temp Source 07/06/20 1404 Oral     SpO2 07/06/20 1404 98 %     Weight 07/06/20 1405 160 lb (72.6 kg)     Height 07/06/20 1405 5\' 2"  (1.575 m)     Head Circumference --      Peak Flow --      Pain Score 07/06/20 1405 8     Pain Loc --      Pain Edu? --      Excl. in GC? --     Constitutional: Alert and oriented. Well appearing and in no distress. Head: Normocephalic and atraumatic. Eyes: Conjunctivae are normal. PERRL. Normal extraocular movements Ears: Canals clear. TMs intact bilaterally. Nose: No congestion/rhinorrhea/epistaxis. Mouth/Throat: Mucous membranes are moist.  Uvula is midline and tonsils are flat.  No oropharyngeal lesions are appreciated.  Mild oropharyngeal erythema is noted. Neck: Supple. No thyromegaly. Hematological/Lymphatic/Immunological: No cervical lymphadenopathy. Cardiovascular: Normal rate, regular rhythm. Normal distal pulses. Respiratory: Normal respiratory effort. No wheezes/rales/rhonchi. Gastrointestinal: Soft and nontender. No distention. ____________________________________________   LABS (pertinent positives/negatives) Labs Reviewed  GROUP A STREP BY PCR  SARS CORONAVIRUS 2 BY RT PCR (HOSPITAL ORDER, PERFORMED IN [facility])  ____________________________________________  RADIOLOGY  CXR  Negative _____________________________________________  PROCEDURES  Decadron 10 mg IM  Procedures ____________________________________________  INITIAL IMPRESSION / ASSESSMENT AND PLAN / ED COURSE  DDX: strep pharyngitis, URI, CAP, COVID  Patient with ED evaluation of sore throat, cough, and nasal drainage for the last 2 weeks.  Patient's exam is overall benign x-ray evidence of  any acute pulmonary process.  Viral testing was negative and strep test was also negative at this time.  Patient is discharged with prescriptions for Tessalon Perles, and prednisone, for likely viral etiology.  Return precautions have been reviewed.  09/06/20 was evaluated in Emergency Department on 07/06/2020 for the symptoms described in the history of present illness. She was evaluated in the context of the global COVID-19 pandemic, which necessitated consideration that the patient might be at risk for infection with the SARS-CoV-2 virus that causes COVID-19. Institutional protocols and algorithms that pertain to the evaluation of patients at risk for COVID-19 are in a state of rapid change based on information released by regulatory bodies including the CDC and federal and state organizations. These policies and algorithms were followed during the patient's care in the ED. ____________________________________________  FINAL CLINICAL IMPRESSION(S) / ED DIAGNOSES  Final diagnoses:  Sore throat  Viral URI with cough      Jaleea Alesi, 09/06/2020, PA-C 07/06/20 2326    09/06/20, MD 07/06/20 2328

## 2020-07-06 NOTE — Discharge Instructions (Signed)
Your exam is most consistent with a viral bronchitis. Chest x-ray was negative for any pneumonia, rapid strep test and Covid test. Take the steroid as directed and the cough medicine as needed. Continue to take over-the-counter allergy medicine and nasal sprays for additional relief. Rest, hydrate, and follow-up with your primary provider for ongoing symptoms. Return to the ED as needed.

## 2020-07-06 NOTE — ED Triage Notes (Signed)
Pt to ED via POV c/o sore throat, cough, and drainage x 2 weeks. Pt denies fevers. Pt is in NAD.

## 2020-07-06 NOTE — ED Notes (Signed)
Pt states the symptoms (cough, sore throat, chest congestion, shortness of breath at night) began 2 weeks ago, but getting progressively worse.  Pt is ambulatory to the room and in no acute distress currently.

## 2020-07-06 NOTE — ED Notes (Signed)
Pt verbalized understanding of discharge instructions. NAD at this time. 

## 2020-11-06 ENCOUNTER — Inpatient Hospital Stay (HOSPITAL_COMMUNITY)
Admission: EM | Admit: 2020-11-06 | Discharge: 2020-11-06 | Disposition: A | Discharge status: Home / Self Care | Payer: Medicaid Other | Attending: Obstetrics and Gynecology | Admitting: Obstetrics and Gynecology

## 2020-11-06 ENCOUNTER — Other Ambulatory Visit: Payer: Self-pay

## 2020-11-06 ENCOUNTER — Encounter (HOSPITAL_COMMUNITY): Payer: Self-pay | Admitting: Emergency Medicine

## 2020-11-06 DIAGNOSIS — F17210 Nicotine dependence, cigarettes, uncomplicated: Secondary | ICD-10-CM | POA: Insufficient documentation

## 2020-11-06 DIAGNOSIS — N939 Abnormal uterine and vaginal bleeding, unspecified: Secondary | ICD-10-CM | POA: Insufficient documentation

## 2020-11-06 LAB — CBC
HCT: 28.5 % — ABNORMAL LOW (ref 36.0–46.0)
Hemoglobin: 9 g/dL — ABNORMAL LOW (ref 12.0–15.0)
MCH: 27.4 pg (ref 26.0–34.0)
MCHC: 31.6 g/dL (ref 30.0–36.0)
MCV: 86.6 fL (ref 80.0–100.0)
Platelets: 347 10*3/uL (ref 150–400)
RBC: 3.29 MIL/uL — ABNORMAL LOW (ref 3.87–5.11)
RDW: 19 % — ABNORMAL HIGH (ref 11.5–15.5)
WBC: 6.5 10*3/uL (ref 4.0–10.5)
nRBC: 0 % (ref 0.0–0.2)

## 2020-11-06 LAB — I-STAT BETA HCG BLOOD, ED (MC, WL, AP ONLY): I-stat hCG, quantitative: <5 m[IU]/mL (ref <5)

## 2020-11-06 MED ORDER — MEGESTROL ACETATE 40 MG PO TABS: 40.0000 mg | ORAL_TABLET | Freq: Two times a day (BID) | ORAL | 1 refills | Status: AC

## 2020-11-06 MED ORDER — POLYSACCHARIDE IRON COMPLEX 150 MG PO CAPS: 150.0000 mg | ORAL_CAPSULE | ORAL | 3 refills | Status: DC

## 2020-11-06 MED ORDER — POLYSACCHARIDE IRON COMPLEX 150 MG PO CAPS
150.0000 mg | ORAL_CAPSULE | Freq: Every day | ORAL | Status: DC
  Administered 2020-11-06: 150 mg via ORAL
  Filled 2020-11-06 (×2): qty 1

## 2020-11-06 MED ORDER — MEGESTROL ACETATE 40 MG PO TABS
40.0000 mg | ORAL_TABLET | Freq: Every day | ORAL | Status: DC
  Administered 2020-11-06: 40 mg via ORAL
  Filled 2020-11-06: qty 1

## 2020-11-06 NOTE — ED Notes (Signed)
Dr. Clarene Duke to assess patient for appropriateness of transport to [facility]

## 2020-11-06 NOTE — ED Triage Notes (Signed)
Emergency Medicine Provider OB Triage Evaluation Note  [name] is a 34 y.o. female, [id number], at Unknown gestation who presents to the emergency department with complaints of heavy vaginal bleeding, irregular periods.  PT has been having irregular periods since March. She has never been evaluated by GYN. She has been bleeding for the past 2 weeks, very heavy over the past few days with soaking through a pad every hour. Endorses fatigue, no syncope. No anticoagulant use, not on birth control.   Review of  Systems  Positive: vaginal bleeding, pelvic cramping, fatigue Negative: syncope, fever  Physical Exam  BP 118/89   Pulse (!) 103   Temp 98.3 F (36.8 C) (Oral)   Resp 16   Ht 5\' 2"  (1.575 m)   Wt 63.5 kg   SpO2 99%   BMI 25.61 kg/m  General: Awake, no distress  HEENT: Atraumatic  Resp: Normal effort  Abd: Nondistended MSK: Moves all extremities without difficulty Neuro: Speech clear, normal gait Skin: no pallor  Medical Decision Making  Pt evaluated for GYN concern and is stable for transfer to [facility]. Pt is in agreement with plan for transfer.  8:51 AM Discussed with [facility] APP, Sam, who accepts patient in transfer.  Clinical Impression  No diagnosis found.     Armend Hochstatter, , MD 11/06/20 [phone]

## 2020-11-06 NOTE — ED Triage Notes (Signed)
Pt reports heavy vaginal bleeding for the past few days, states she is soaking through a night time pad every hour. She reports vaginal bleeding has been ongoing x2 weeks but recently became heavy. Also endorses feeling generally weak now. Ambulatory to triage with nad.

## 2020-11-06 NOTE — ED Notes (Signed)
Transport contacted to take patient over to [facility] 

## 2020-11-06 NOTE — Discharge Instructions (Signed)
Abnormal Uterine Bleeding Abnormal uterine bleeding means bleeding more than usual from your uterus. It can include:  Bleeding between periods.  Bleeding after sex.  Bleeding that is heavier than normal.  Periods that last longer than usual.  Bleeding after you have stopped having your period (menopause). There are many problems that may cause this. You should see a doctor for any kind of bleeding that is not normal. Treatment depends on the cause of the bleeding. Follow these instructions at home:  Watch your condition for any changes.  Do not use tampons, douche, or have sex, if your doctor tells you not to.  Change your pads often.  Get regular well-woman exams. Make sure they include a pelvic exam and cervical cancer screening.  Keep all follow-up visits as told by your doctor. This is important. Contact a doctor if:  The bleeding lasts more than one week.  You feel dizzy at times.  You feel like you are going to throw up (nauseous).  You throw up. Get help right away if:  You pass out.  You have to change pads every hour.  You have belly (abdominal) pain.  You have a fever.  You get sweaty.  You get weak.  You passing large blood clots from your vagina. Summary  Abnormal uterine bleeding means bleeding more than usual from your uterus.  There are many problems that may cause this. You should see a doctor for any kind of bleeding that is not normal.  Treatment depends on the cause of the bleeding. This information is not intended to replace advice given to you by your health care provider. Make sure you discuss any questions you have with your health care provider. Document Revised: 12/07/2016 Document Reviewed: 12/07/2016 Elsevier Patient Education  2020 [employer].   Intrauterine Device Information An intrauterine device (IUD) is a medical device that is inserted in the uterus to prevent pregnancy. It is a small, T-shaped device that has one or  two nylon strings hanging down from it. The strings hang out of the lower part of the uterus (cervix) to allow for future IUD removal. There are two types of IUDs available:  Hormone IUD. This type of IUD is made of plastic and contains the hormone progestin (synthetic progesterone). A hormone IUD may last 3-5 years.  Copper IUD. This type of IUD has copper wire wrapped around it. A copper IUD may last up to 10 years. How is an IUD inserted? An IUD is inserted through the vagina and placed into the uterus with a minor medical procedure. The exact procedure for IUD insertion may vary among health care providers and hospitals. How does an IUD work? Synthetic progesterone in a hormonal IUD prevents pregnancy by:  Thickening cervical mucus to prevent sperm from entering the uterus.  Thinning the uterine lining to prevent a fertilized egg from being implanted there. Copper in a copper IUD prevents pregnancy by making the uterus and fallopian tubes produce a fluid that kills sperm. What are the advantages of an IUD? Advantages of either type of IUD  It is highly effective in preventing pregnancy.  It is reversible. You can become pregnant shortly after the IUD is removed.  It is low-maintenance and can stay in place for a long time.  There are no estrogen-related side effects.  It can be used when breastfeeding.  It is not associated with weight gain.  It can be inserted right after childbirth, an abortion, or a miscarriage. Advantages of a hormone  IUD  If it is inserted within 7 days of your period starting, it works right after it is inserted. If the hormone IUD is inserted at any other time in your cycle, you will need to use a backup method of birth control for 7 days after insertion.  It can make menstrual periods lighter.  It can reduce menstrual cramping.  It can be used for 3-5 years. Advantages of a copper IUD  It works right after it is inserted.  It can be used as a  form of emergency birth control if it is inserted within 5 days after having unprotected sex.  It does not interfere with your body's natural hormones.  It can be used for 10 years. What are the disadvantages of an IUD?  An IUD may cause irregular menstrual bleeding for a period of time after insertion.  You may have pain during insertion and have cramping and vaginal bleeding after insertion.  An IUD may cut the uterus (uterine perforation) when it is inserted. This is rare.  An IUD may cause pelvic inflammatory disease (PID), which is an infection in the uterus and fallopian tubes. This is rare, and it usually happens during the first 20 days after the IUD is inserted.  A copper IUD can make your menstrual flow heavier and more painful. How is an IUD removed?  You will lie on your back with your knees bent and your feet in footrests (stirrups).  A device will be inserted into your vagina to spread apart the vaginal walls (speculum). This will allow your health care provider to see the strings attached to the IUD.  Your health care provider will use a small instrument (forceps) to grasp the IUD strings and pull firmly until the IUD is removed. You may have some discomfort when the IUD is removed. Your health care provider may recommend taking over-the-counter pain relievers, such as ibuprofen, before the procedure. You may also have minor spotting for a few days after the procedure. The exact procedure for IUD removal may vary among health care providers and hospitals. Is the IUD right for me? Your health care provider will make sure you are a good candidate for an IUD and will discuss the advantages, disadvantages, and possible side effects with you. Summary  An intrauterine device (IUD) is a medical device that is inserted in the uterus to prevent pregnancy. It is a small, T-shaped device that has one or two nylon strings hanging down from it.  A hormone IUD contains the hormone  progestin (synthetic progesterone). A copper IUD has copper wire wrapped around it.  Synthetic progesterone in a hormone IUD prevents pregnancy by thickening cervical mucus and thinning the walls of the uterus. Copper in a copper IUD prevents pregnancy by making the uterus and fallopian tubes produce a fluid that kills sperm.  A hormone IUD can be left in place for 3-5 years. A copper IUD can be left in place for up to 10 years.  An IUD is inserted and removed by a health care provider. You may feel some pain during insertion and removal. Your health care provider may recommend taking over-the-counter pain medicine, such as ibuprofen, before an IUD procedure. This information is not intended to replace advice given to you by your health care provider. Make sure you discuss any questions you have with your health care provider. Document Revised: 11/25/2017 Document Reviewed: 01/11/2017 Elsevier Patient Education  2020 [employer].

## 2020-11-06 NOTE — MAU Note (Signed)
Sent from [facility] ED for evaluation of VB.  Reports VB has been going on since March, but VB has gotten worse.  Reports has been bleeding for past 2 weeks.  Unsure of LMP.

## 2020-11-06 NOTE — MAU Provider Note (Signed)
History     CSN: [id number]  Arrival date and time: 11/06/20 7711  First Provider Initiated Contact with Patient 11/06/20 1001      Chief Complaint  Patient presents with  . Vaginal Bleeding   HPI [name] is a 34 y.o. GYN patient who presents to MAU from [facility] for evaluation of heavy vaginal bleeding. This is a recurrent problem, onset March 2021 and worsening in the past two weeks. Patient endorses occasional dizziness, denies weakness, syncope, no difficulty with ADLS.  Patient is remote from use of contraceptives. She previously used Depo but discontinued due to heavy vaginal bleeding.   OB History    Gravida  3   Para  2   Term  1   Preterm  1   AB  1   Living  2     SAB  0   TAB  1   Ectopic  0   Multiple  0   Live Births  2           Past Medical History:  Diagnosis Date  . Adjustment disorder with emotional disturbance   . Alcoholic gastritis 03/2013    hospitalization  . Hematemesis 03/2013   along with alcoholic gastritis, hospitalization  . Metabolic acidosis 03/2013    hospitaliation due to alcohol abuse, dehyration, nausea/vomiting  . Polysubstance abuse (HCC)   . Pyelonephritis 03/2010   hospitalization  . Seizures (HCC)   . Stomach ulcer    never had any tests to confirm this diagni=oses    Past Surgical History:  Procedure Laterality Date  . BREAST LUMPECTOMY Right    benign pathology  . CESAREAN SECTION  2005  . MOUTH SURGERY      Family History  Problem Relation Age of Onset  . Diabetes Mother   . Hypertension Father   . Diabetes Maternal Aunt   . Diabetes Maternal Uncle   . Cancer Maternal Uncle   . Diabetes Maternal Grandmother   . Cancer Maternal Grandmother   . Diabetes Maternal Grandfather     Social History   Tobacco Use  . Smoking status: Current Every Day Smoker    Packs/day: 0.50    Types: Cigarettes    Last attempt to quit: 11/26/2013    Years since quitting: 6.9  . Smokeless tobacco: Never  Used  Substance Use Topics  . Alcohol use: Yes    Comment: binge drinking; quit Aug. 2014 very little drinking now  . Drug use: Yes    Types: Marijuana, Cocaine    Allergies: No Known Allergies  Medications Prior to Admission  Medication Sig Dispense Refill Last Dose  . benzonatate (TESSALON PERLES) 100 MG capsule Take 1-2 tabs TID prn cough 30 capsule 0     Review of Systems  Gastrointestinal: Negative for abdominal pain.  Genitourinary: Positive for vaginal bleeding.  All other systems reviewed and are negative.  Physical Exam   Blood pressure 113/66, pulse 89, temperature 98.3 F (36.8 C), temperature source Oral, resp. rate 20, height 5\' 2"  (1.575 m), weight 64.8 kg, SpO2 100 %, currently breastfeeding.  Physical Exam Vitals and nursing note reviewed. Exam conducted with a chaperone present.  Constitutional:      Appearance: Normal appearance.  Cardiovascular:     Rate and Rhythm: Normal rate.     Pulses: Normal pulses.     Heart sounds: Normal heart sounds.  Pulmonary:     Effort: Pulmonary effort is normal.     Breath sounds: Normal breath  sounds.  Abdominal:     General: Abdomen is flat. Bowel sounds are normal.     Tenderness: There is no abdominal tenderness. There is no right CVA tenderness or left CVA tenderness.  Genitourinary:    General: Normal vulva.     Comments: Small amount of dark red blood visible in vault. Removed with faux swab x 2. No active bleeding noted Skin:    General: Skin is warm.     Capillary Refill: Capillary refill takes less than 2 seconds.  Neurological:     General: No focal deficit present.     Mental Status: She is alert.     MAU Course  Procedures: speculum exam  Patient Vitals for the past 24 hrs:  BP Temp Temp src Pulse Resp SpO2 Height Weight  11/06/20 1121 (!) 100/57 -- -- 89 18 -- -- --  11/06/20 0941 113/66 98.3 F (36.8 C) Oral 89 20 100 % -- --  11/06/20 0935 -- -- -- -- -- -- 5\' 2"  (1.575 m) 64.8 kg  11/06/20  0806 118/89 98.3 F (36.8 C) Oral (!) 103 16 99 % 5\' 2"  (1.575 m) 63.5 kg   Results for orders placed or performed during the hospital encounter of 11/06/20 (from the past 24 hour(s))  CBC     Status: Abnormal   Collection Time: 11/06/20  8:16 AM  Result Value Ref Range   WBC 6.5 4.0 - 10.5 K/uL   RBC 3.29 (L) 3.87 - 5.11 MIL/uL   Hemoglobin 9.0 (L) 12.0 - 15.0 g/dL   HCT 13/11/21 (L) 36 - 46 %   MCV 86.6 80.0 - 100.0 fL   MCH 27.4 26.0 - 34.0 pg   MCHC 31.6 30.0 - 36.0 g/dL   RDW 13/11/21 (H) 50.0 - 37.0 %   Platelets 347 150 - 400 K/uL   nRBC 0.0 0.0 - 0.2 %  I-Stat beta hCG blood, ED     Status: None   Collection Time: 11/06/20  8:31 AM  Result Value Ref Range   I-stat hCG, quantitative <5.0 <5 mIU/mL   Comment 3           Meds ordered this encounter  Medications  . megestrol (MEGACE) tablet 40 mg  . iron polysaccharides (NIFEREX) capsule 150 mg  . megestrol (MEGACE) 40 MG tablet    Sig: Take 1 tablet (40 mg total) by mouth 2 (two) times daily.    Dispense:  60 tablet    Refill:  1    Order Specific Question:   Supervising Provider    Answer:   ERVIN, MICHAEL L [[id number]]  . iron polysaccharides (NIFEREX) 150 MG capsule    Sig: Take 1 capsule (150 mg total) by mouth every other day.    Dispense:  30 capsule    Refill:  3    Order Specific Question:   Supervising Provider    Answer:   89.1 L [[id number]]    Assessment and Plan  --34 y.o. [name] non-pregnant patient --Abnormal uterine bleeding --Initiate Megace and PO Fe --Discharge home in stable condition  F/U: --Outpatient pelvic ultrasound --Lives in [place], desires f/u with [facility] --Patient to investigate IUD as intervention for AUB  [facility], CNM 11/06/2020, 2:12 PM

## 2020-11-18 ENCOUNTER — Telehealth: Payer: Self-pay | Admitting: *Deleted

## 2020-11-18 NOTE — Telephone Encounter (Signed)
-  Pt called regarding discharge instructions for obtaining follow-up appointment.  Pt states she understood that she is to wait for a call from the office but has not been contacted yet.  RNCM provided telephone number [phone]) and location of office to call.

## 2020-12-08 ENCOUNTER — Encounter: Payer: Medicaid Other | Admitting: Obstetrics & Gynecology

## 2021-05-22 ENCOUNTER — Emergency Department (HOSPITAL_COMMUNITY): Payer: Self-pay

## 2021-05-22 ENCOUNTER — Other Ambulatory Visit: Payer: Self-pay

## 2021-05-22 ENCOUNTER — Emergency Department (HOSPITAL_COMMUNITY)
Admission: EM | Admit: 2021-05-22 | Discharge: 2021-05-22 | Disposition: A | Discharge status: Home / Self Care | Payer: Self-pay | Attending: Emergency Medicine | Admitting: Emergency Medicine

## 2021-05-22 ENCOUNTER — Encounter (HOSPITAL_COMMUNITY): Payer: Self-pay

## 2021-05-22 DIAGNOSIS — R109 Unspecified abdominal pain: Secondary | ICD-10-CM | POA: Insufficient documentation

## 2021-05-22 DIAGNOSIS — Z5321 Procedure and treatment not carried out due to patient leaving prior to being seen by health care provider: Secondary | ICD-10-CM | POA: Insufficient documentation

## 2021-05-22 LAB — URINALYSIS, ROUTINE W REFLEX MICROSCOPIC
Bilirubin Urine: NEGATIVE
Glucose, UA: NEGATIVE mg/dL
Ketones, ur: 5 mg/dL — AB
Nitrite: NEGATIVE
Protein, ur: 30 mg/dL — AB
Specific Gravity, Urine: 1.03 (ref 1.005–1.030)
pH: 6 (ref 5.0–8.0)

## 2021-05-22 LAB — CBC WITH DIFFERENTIAL/PLATELET
Abs Immature Granulocytes: 0.01 10*3/uL (ref 0.00–0.07)
Basophils Absolute: 0.1 10*3/uL (ref 0.0–0.1)
Basophils Relative: 1 %
Eosinophils Absolute: 0.1 10*3/uL (ref 0.0–0.5)
Eosinophils Relative: 1 %
HCT: 31.3 % — ABNORMAL LOW (ref 36.0–46.0)
Hemoglobin: 9.7 g/dL — ABNORMAL LOW (ref 12.0–15.0)
Immature Granulocytes: 0 %
Lymphocytes Relative: 29 %
Lymphs Abs: 1.6 10*3/uL (ref 0.7–4.0)
MCH: 25.7 pg — ABNORMAL LOW (ref 26.0–34.0)
MCHC: 31 g/dL (ref 30.0–36.0)
MCV: 82.8 fL (ref 80.0–100.0)
Monocytes Absolute: 0.8 10*3/uL (ref 0.1–1.0)
Monocytes Relative: 15 %
Neutro Abs: 3 10*3/uL (ref 1.7–7.7)
Neutrophils Relative %: 54 %
Platelets: 364 10*3/uL (ref 150–400)
RBC: 3.78 MIL/uL — ABNORMAL LOW (ref 3.87–5.11)
RDW: 19.5 % — ABNORMAL HIGH (ref 11.5–15.5)
WBC: 5.6 10*3/uL (ref 4.0–10.5)
nRBC: 0 % (ref 0.0–0.2)

## 2021-05-22 LAB — COMPREHENSIVE METABOLIC PANEL
ALT: 29 U/L (ref 0–44)
AST: 44 U/L — ABNORMAL HIGH (ref 15–41)
Albumin: 4.2 g/dL (ref 3.5–5.0)
Alkaline Phosphatase: 79 U/L (ref 38–126)
Anion gap: 10 (ref 5–15)
BUN: 5 mg/dL — ABNORMAL LOW (ref 6–20)
CO2: 25 mmol/L (ref 22–32)
Calcium: 9 mg/dL (ref 8.9–10.3)
Chloride: 101 mmol/L (ref 98–111)
Creatinine, Ser: 0.61 mg/dL (ref 0.44–1.00)
GFR, Estimated: >60 mL/min (ref >60)
Glucose, Bld: 105 mg/dL — ABNORMAL HIGH (ref 70–99)
Potassium: 3.6 mmol/L (ref 3.5–5.1)
Sodium: 136 mmol/L (ref 135–145)
Total Bilirubin: 0.5 mg/dL (ref 0.3–1.2)
Total Protein: 7.3 g/dL (ref 6.5–8.1)

## 2021-05-22 LAB — LIPASE, BLOOD: Lipase: 22 U/L (ref 11–51)

## 2021-05-22 LAB — I-STAT BETA HCG BLOOD, ED (MC, WL, AP ONLY): I-stat hCG, quantitative: <5 m[IU]/mL (ref <5)

## 2021-05-22 NOTE — Progress Notes (Signed)
Called pt's name in waiting room for CT with no answer

## 2021-05-22 NOTE — ED Notes (Signed)
Patient states since her results are on mychart she is going to urgent care and will let them pull her results since that is faster

## 2021-05-22 NOTE — ED Provider Notes (Signed)
Emergency Medicine Provider Triage Evaluation Note  [name] , [name] 35 y.o. female  was evaluated in triage.  Pt complains of right flank pain.  Radiates into the abdomen.  No prior history of kidney stone.  Worse with movement as well as urinating.  Not associated with food intake.  Prior C-section however still has gallbladder and appendix.  Has noted either an early menstrual cycle versus blood in her urine.  No dysuria or increased frequency.  States she had something similar previously and she was diagnosed with pyelonephritis need hospitalization at [facility].  No cough, chest pain, shortness of breath. Denies changes of pregnancy  Review of Systems  Positive: Flank pain, abdominal pain Negative: Fever, chills, emesis, cough, chest pain, shortness of breath  Physical Exam  There were no vitals taken for this visit. Gen:   Awake, no distress   Resp:  Normal effort  MSK:   Moves extremities without difficulty. No midline spine tenderness ABD:  Positive CVA tap on the right.  Diffuse tenderness to abdomen however negative Murphy sign, negative McBurney point Other:    Medical Decision Making  Medically screening exam initiated at 2:25 PM.  Appropriate orders placed.  [name] was informed that the remainder of the evaluation will be completed by another provider, this initial triage assessment does not replace that evaluation, and the importance of remaining in the ED until their evaluation is complete.   Right flank pain, abdominal pain  Labs and imaging ordered>> stable   Deborah Esch A, PA-C 05/22/21 1431    Deborah Kaplan, MD 05/22/21 1504

## 2021-05-22 NOTE — ED Triage Notes (Signed)
Pt complains of right flank pain.  Radiates into the abdomen.  No prior history of kidney stone.

## 2021-05-23 LAB — URINE CULTURE

## 2022-08-08 ENCOUNTER — Encounter (HOSPITAL_COMMUNITY): Payer: Self-pay | Admitting: *Deleted

## 2022-08-08 ENCOUNTER — Emergency Department (HOSPITAL_COMMUNITY): Payer: No Typology Code available for payment source

## 2022-08-08 ENCOUNTER — Inpatient Hospital Stay (HOSPITAL_COMMUNITY)
Admission: EM | Admit: 2022-08-08 | Discharge: 2022-08-11 | DRG: 101 | Disposition: A | Discharge status: Home / Self Care | Payer: No Typology Code available for payment source | Attending: Internal Medicine | Admitting: Internal Medicine

## 2022-08-08 ENCOUNTER — Other Ambulatory Visit: Payer: Self-pay

## 2022-08-08 ENCOUNTER — Observation Stay (HOSPITAL_COMMUNITY): Payer: No Typology Code available for payment source

## 2022-08-08 DIAGNOSIS — F17210 Nicotine dependence, cigarettes, uncomplicated: Secondary | ICD-10-CM | POA: Diagnosis present

## 2022-08-08 DIAGNOSIS — R319 Hematuria, unspecified: Secondary | ICD-10-CM | POA: Diagnosis present

## 2022-08-08 DIAGNOSIS — R7401 Elevation of levels of liver transaminase levels: Secondary | ICD-10-CM

## 2022-08-08 DIAGNOSIS — D649 Anemia, unspecified: Secondary | ICD-10-CM | POA: Diagnosis present

## 2022-08-08 DIAGNOSIS — B349 Viral infection, unspecified: Secondary | ICD-10-CM

## 2022-08-08 DIAGNOSIS — Z23 Encounter for immunization: Secondary | ICD-10-CM

## 2022-08-08 DIAGNOSIS — F1029 Alcohol dependence with unspecified alcohol-induced disorder: Secondary | ICD-10-CM | POA: Diagnosis not present

## 2022-08-08 DIAGNOSIS — N390 Urinary tract infection, site not specified: Secondary | ICD-10-CM

## 2022-08-08 DIAGNOSIS — Z8711 Personal history of peptic ulcer disease: Secondary | ICD-10-CM

## 2022-08-08 DIAGNOSIS — Z79899 Other long term (current) drug therapy: Secondary | ICD-10-CM

## 2022-08-08 DIAGNOSIS — R569 Unspecified convulsions: Principal | ICD-10-CM

## 2022-08-08 DIAGNOSIS — F1010 Alcohol abuse, uncomplicated: Secondary | ICD-10-CM

## 2022-08-08 DIAGNOSIS — E871 Hypo-osmolality and hyponatremia: Secondary | ICD-10-CM

## 2022-08-08 DIAGNOSIS — A599 Trichomoniasis, unspecified: Secondary | ICD-10-CM

## 2022-08-08 DIAGNOSIS — Z833 Family history of diabetes mellitus: Secondary | ICD-10-CM

## 2022-08-08 DIAGNOSIS — K760 Fatty (change of) liver, not elsewhere classified: Secondary | ICD-10-CM | POA: Diagnosis present

## 2022-08-08 DIAGNOSIS — F10239 Alcohol dependence with withdrawal, unspecified: Secondary | ICD-10-CM | POA: Diagnosis present

## 2022-08-08 DIAGNOSIS — Z20822 Contact with and (suspected) exposure to covid-19: Secondary | ICD-10-CM | POA: Diagnosis present

## 2022-08-08 DIAGNOSIS — R7989 Other specified abnormal findings of blood chemistry: Secondary | ICD-10-CM | POA: Diagnosis present

## 2022-08-08 DIAGNOSIS — Z8249 Family history of ischemic heart disease and other diseases of the circulatory system: Secondary | ICD-10-CM

## 2022-08-08 DIAGNOSIS — R9431 Abnormal electrocardiogram [ECG] [EKG]: Secondary | ICD-10-CM

## 2022-08-08 DIAGNOSIS — E876 Hypokalemia: Secondary | ICD-10-CM | POA: Diagnosis present

## 2022-08-08 DIAGNOSIS — R509 Fever, unspecified: Secondary | ICD-10-CM

## 2022-08-08 DIAGNOSIS — F1020 Alcohol dependence, uncomplicated: Secondary | ICD-10-CM | POA: Diagnosis present

## 2022-08-08 DIAGNOSIS — Z809 Family history of malignant neoplasm, unspecified: Secondary | ICD-10-CM

## 2022-08-08 DIAGNOSIS — F1290 Cannabis use, unspecified, uncomplicated: Secondary | ICD-10-CM | POA: Diagnosis present

## 2022-08-08 DIAGNOSIS — W19XXXA Unspecified fall, initial encounter: Secondary | ICD-10-CM | POA: Diagnosis present

## 2022-08-08 LAB — LIPASE, BLOOD: Lipase: 21 U/L (ref 11–51)

## 2022-08-08 LAB — MENINGITIS/ENCEPHALITIS PANEL (CSF)

## 2022-08-08 LAB — PHOSPHORUS: Phosphorus: 2.6 mg/dL (ref 2.5–4.6)

## 2022-08-08 LAB — CBC WITH DIFFERENTIAL/PLATELET
Abs Immature Granulocytes: 0.02 10*3/uL (ref 0.00–0.07)
Basophils Absolute: 0 10*3/uL (ref 0.0–0.1)
Basophils Relative: 0 %
Eosinophils Absolute: 0 10*3/uL (ref 0.0–0.5)
Eosinophils Relative: 0 %
HCT: 32.3 % — ABNORMAL LOW (ref 36.0–46.0)
Hemoglobin: 10.8 g/dL — ABNORMAL LOW (ref 12.0–15.0)
Immature Granulocytes: 0 %
Lymphocytes Relative: 17 %
Lymphs Abs: 1.3 10*3/uL (ref 0.7–4.0)
MCH: 26.3 pg (ref 26.0–34.0)
MCHC: 33.4 g/dL (ref 30.0–36.0)
MCV: 78.8 fL — ABNORMAL LOW (ref 80.0–100.0)
Monocytes Absolute: 0.9 10*3/uL (ref 0.1–1.0)
Monocytes Relative: 11 %
Neutro Abs: 5.4 10*3/uL (ref 1.7–7.7)
Neutrophils Relative %: 72 %
Platelets: 277 10*3/uL (ref 150–400)
RBC: 4.1 MIL/uL (ref 3.87–5.11)
RDW: 21.5 % — ABNORMAL HIGH (ref 11.5–15.5)
WBC: 7.6 10*3/uL (ref 4.0–10.5)
nRBC: 0 % (ref 0.0–0.2)

## 2022-08-08 LAB — MAGNESIUM: Magnesium: 1.8 mg/dL (ref 1.7–2.4)

## 2022-08-08 LAB — CSF CELL COUNT WITH DIFFERENTIAL
RBC Count, CSF: 4 /mm3 — ABNORMAL HIGH
Tube #: 3
WBC, CSF: 0 /mm3 (ref 0–5)

## 2022-08-08 LAB — RESP PANEL BY RT-PCR (FLU A&B, COVID) ARPGX2
Influenza A by PCR: NEGATIVE
Influenza B by PCR: NEGATIVE
SARS Coronavirus 2 by RT PCR: NEGATIVE

## 2022-08-08 LAB — COMPREHENSIVE METABOLIC PANEL
ALT: 69 U/L — ABNORMAL HIGH (ref 0–44)
AST: 63 U/L — ABNORMAL HIGH (ref 15–41)
Albumin: 4.5 g/dL (ref 3.5–5.0)
Alkaline Phosphatase: 78 U/L (ref 38–126)
Anion gap: 16 — ABNORMAL HIGH (ref 5–15)
BUN: 7 mg/dL (ref 6–20)
CO2: 25 mmol/L (ref 22–32)
Calcium: 9.2 mg/dL (ref 8.9–10.3)
Chloride: 89 mmol/L — ABNORMAL LOW (ref 98–111)
Creatinine, Ser: 0.81 mg/dL (ref 0.44–1.00)
GFR, Estimated: >60 mL/min (ref >60)
Glucose, Bld: 97 mg/dL (ref 70–99)
Potassium: 2.9 mmol/L — ABNORMAL LOW (ref 3.5–5.1)
Sodium: 130 mmol/L — ABNORMAL LOW (ref 135–145)
Total Bilirubin: 0.6 mg/dL (ref 0.3–1.2)
Total Protein: 7.7 g/dL (ref 6.5–8.1)

## 2022-08-08 LAB — HIV ANTIBODY (ROUTINE TESTING W REFLEX): HIV Screen 4th Generation wRfx: NONREACTIVE

## 2022-08-08 LAB — CBG MONITORING, ED: Glucose-Capillary: 109 mg/dL — ABNORMAL HIGH (ref 70–99)

## 2022-08-08 LAB — PROTEIN AND GLUCOSE, CSF
Glucose, CSF: 69 mg/dL (ref 40–70)
Total  Protein, CSF: 30 mg/dL (ref 15–45)

## 2022-08-08 LAB — ETHANOL: Alcohol, Ethyl (B): <10 mg/dL (ref <10)

## 2022-08-08 LAB — I-STAT BETA HCG BLOOD, ED (MC, WL, AP ONLY): I-stat hCG, quantitative: <5 m[IU]/mL (ref <5)

## 2022-08-08 MED ORDER — POLYETHYLENE GLYCOL 3350 17 G PO PACK: 17.0000 g | PACK | Freq: Every day | ORAL | Status: DC | PRN

## 2022-08-08 MED ORDER — VANCOMYCIN HCL IN DEXTROSE 1-5 GM/200ML-% IV SOLN
1000.0000 mg | Freq: Once | INTRAVENOUS | Status: DC
  Filled 2022-08-08: qty 200

## 2022-08-08 MED ORDER — ACETAMINOPHEN 650 MG RE SUPP
650.0000 mg | Freq: Once | RECTAL | Status: AC
  Administered 2022-08-08: 650 mg via RECTAL
  Filled 2022-08-08: qty 1

## 2022-08-08 MED ORDER — LIDOCAINE HCL (PF) 1 % IJ SOLN: 10.0000 mL | INTRAMUSCULAR | Status: DC

## 2022-08-08 MED ORDER — VANCOMYCIN HCL 1.5 G IV SOLR
1500.0000 mg | Freq: Once | INTRAVENOUS | Status: DC
  Filled 2022-08-08: qty 30

## 2022-08-08 MED ORDER — LIDOCAINE-EPINEPHRINE (PF) 2 %-1:200000 IJ SOLN
10.0000 mL | Freq: Once | INTRAMUSCULAR | Status: AC
  Administered 2022-08-08: 10 mL
  Filled 2022-08-08: qty 20

## 2022-08-08 MED ORDER — ONDANSETRON HCL 4 MG/2ML IJ SOLN
4.0000 mg | Freq: Once | INTRAMUSCULAR | Status: AC
  Administered 2022-08-08: 4 mg via INTRAVENOUS
  Filled 2022-08-08: qty 2

## 2022-08-08 MED ORDER — MIDAZOLAM HCL 2 MG/2ML IJ SOLN: 2.0000 mg | INTRAMUSCULAR | Status: DC

## 2022-08-08 MED ORDER — SODIUM CHLORIDE 0.9 % IV SOLN
2.0000 g | Freq: Once | INTRAVENOUS | Status: AC
  Administered 2022-08-08: 2 g via INTRAVENOUS
  Filled 2022-08-08: qty 20

## 2022-08-08 MED ORDER — DEXTROSE 5 % IV SOLN
10.0000 mg/kg | Freq: Once | INTRAVENOUS | Status: DC
  Filled 2022-08-08: qty 12.7

## 2022-08-08 MED ORDER — LORAZEPAM 2 MG/ML IJ SOLN: 2.0000 mg | INTRAMUSCULAR | Status: DC | PRN

## 2022-08-08 MED ORDER — DEXTROSE 5 % IV SOLN
10.0000 mg/kg | Freq: Three times a day (TID) | INTRAVENOUS | Status: DC
  Administered 2022-08-08 – 2022-08-09 (×3): 635 mg via INTRAVENOUS
  Filled 2022-08-08 (×6): qty 12.7

## 2022-08-08 MED ORDER — VANCOMYCIN HCL 750 MG/150ML IV SOLN
750.0000 mg | Freq: Three times a day (TID) | INTRAVENOUS | Status: DC
Start: 2022-08-09 — End: 2022-08-09
  Administered 2022-08-08 – 2022-08-09 (×2): 750 mg via INTRAVENOUS
  Filled 2022-08-08 (×3): qty 150

## 2022-08-08 MED ORDER — POTASSIUM CHLORIDE 10 MEQ/100ML IV SOLN
10.0000 meq | INTRAVENOUS | Status: AC
  Administered 2022-08-08: 10 meq via INTRAVENOUS
  Filled 2022-08-08: qty 100

## 2022-08-08 MED ORDER — POTASSIUM CHLORIDE 10 MEQ/100ML IV SOLN
10.0000 meq | INTRAVENOUS | Status: DC
  Administered 2022-08-08 (×2): 10 meq via INTRAVENOUS
  Filled 2022-08-08 (×2): qty 100

## 2022-08-08 MED ORDER — PNEUMOCOCCAL 20-VAL CONJ VACC 0.5 ML IM SUSY
0.5000 mL | PREFILLED_SYRINGE | INTRAMUSCULAR | Status: DC
  Filled 2022-08-08: qty 0.5

## 2022-08-08 MED ORDER — CHLORHEXIDINE GLUCONATE 0.12% ORAL RINSE (MEDLINE KIT)
15.0000 mL | Freq: Two times a day (BID) | OROMUCOSAL | Status: DC
  Administered 2022-08-08 – 2022-08-11 (×6): 15 mL via OROMUCOSAL

## 2022-08-08 MED ORDER — DIPHENHYDRAMINE HCL 50 MG/ML IJ SOLN
12.5000 mg | Freq: Once | INTRAMUSCULAR | Status: DC
Start: 2022-08-08 — End: 2022-08-08

## 2022-08-08 MED ORDER — ACETAMINOPHEN 650 MG RE SUPP: 650.0000 mg | Freq: Four times a day (QID) | RECTAL | Status: DC | PRN

## 2022-08-08 MED ORDER — SODIUM CHLORIDE 0.9% FLUSH
3.0000 mL | Freq: Two times a day (BID) | INTRAVENOUS | Status: DC
  Administered 2022-08-08 – 2022-08-11 (×5): 3 mL via INTRAVENOUS

## 2022-08-08 MED ORDER — DEXAMETHASONE SODIUM PHOSPHATE 10 MG/ML IJ SOLN
10.0000 mg | Freq: Once | INTRAMUSCULAR | Status: AC
  Administered 2022-08-08: 10 mg via INTRAVENOUS
  Filled 2022-08-08: qty 1

## 2022-08-08 MED ORDER — ACETAMINOPHEN 325 MG PO TABS
650.0000 mg | ORAL_TABLET | Freq: Four times a day (QID) | ORAL | Status: DC | PRN
  Administered 2022-08-09 – 2022-08-11 (×2): 650 mg via ORAL
  Filled 2022-08-08 (×3): qty 2

## 2022-08-08 MED ORDER — ACETAMINOPHEN 500 MG PO TABS: 1000.0000 mg | ORAL_TABLET | ORAL | Status: AC

## 2022-08-08 MED ORDER — ORAL CARE MOUTH RINSE
15.0000 mL | OROMUCOSAL | Status: DC
  Administered 2022-08-08 – 2022-08-10 (×11): 15 mL via OROMUCOSAL

## 2022-08-08 MED ORDER — LORAZEPAM 2 MG/ML IJ SOLN
0.5000 mg | Freq: Once | INTRAMUSCULAR | Status: AC
  Administered 2022-08-08: 0.5 mg via INTRAVENOUS
  Filled 2022-08-08: qty 1

## 2022-08-08 MED ORDER — LACTATED RINGERS IV BOLUS
1000.0000 mL | Freq: Once | INTRAVENOUS | Status: AC
  Administered 2022-08-08: 1000 mL via INTRAVENOUS

## 2022-08-08 MED ORDER — SODIUM CHLORIDE 0.9 % IV SOLN: INTRAVENOUS | Status: DC

## 2022-08-08 MED ORDER — SODIUM CHLORIDE 0.9 % IV SOLN: 2.0000 g | Freq: Two times a day (BID) | INTRAVENOUS | Status: DC

## 2022-08-08 MED ORDER — DROPERIDOL 2.5 MG/ML IJ SOLN
1.2500 mg | Freq: Once | INTRAMUSCULAR | Status: AC
  Administered 2022-08-08: 1.25 mg via INTRAVENOUS
  Filled 2022-08-08: qty 2

## 2022-08-08 MED ORDER — ENOXAPARIN SODIUM 40 MG/0.4ML IJ SOSY
40.0000 mg | PREFILLED_SYRINGE | INTRAMUSCULAR | Status: DC
  Administered 2022-08-08 – 2022-08-10 (×3): 40 mg via SUBCUTANEOUS
  Filled 2022-08-08 (×3): qty 0.4

## 2022-08-08 MED ORDER — VANCOMYCIN HCL 1500 MG/300ML IV SOLN
1500.0000 mg | Freq: Once | INTRAVENOUS | Status: AC
  Administered 2022-08-08: 1500 mg via INTRAVENOUS
  Filled 2022-08-08: qty 300

## 2022-08-08 MED ORDER — VANCOMYCIN HCL IN DEXTROSE 750-5 MG/150ML-% IV SOLN
750.0000 mg | Freq: Three times a day (TID) | INTRAVENOUS | Status: DC
  Filled 2022-08-08 (×3): qty 150

## 2022-08-08 NOTE — ED Provider Notes (Signed)
[facility] EMERGENCY DEPARTMENT Provider Note   CSN: [id number] Arrival date & time: 08/08/22  1309     History  Chief Complaint  Patient presents with   Seizures   Fever    [name] is a 36 y.o. female.  36 year old female with a remote history of a single seizure, alcohol use, and marijuana use who presents emergency department with nausea, vomiting, and possible seizure.  Patient states for the past several days she has been having significant nausea and vomiting.  Describes it as nonbloody nonbilious.  Says that she has also had epigastric abdominal pain with it.  Also reports fevers and chills and extreme fatigue.  Says that she was getting better today but then went outside and came back inside and was told by her mother that she had a seizure.  She does not remember the episode.  Per EMS it was less than 30 seconds of shaking.  No tongue bite or bladder and bladder incontinence.  Says that she has not had a headache or significant neck pain.  Does have a remote history of a seizure but cannot recall what caused it.  Does drink alcohol approximately 3-4 times per week and 2-3 drinks per session.  Unsure of alcohol withdrawal history.  Denies any dysuria or frequency.  Says that she does smoke marijuana several times a week.  No history of abdominal surgeries.  Additional history obtained per the patient's mother [name] who is an Therapist, sports. Says [name] was sitting at the table and noticed that the table was shaking. Says that she had very bad shaking. Says eyes were open and fixed but she wasn't responding. Says that it lasted approximately 20 seconds. Afterwards sat there for a minute and [name] asked what she was saying then she started to recognize who her mother was. Mother says that it happened again. Says L arm was extended and stiff and right arm was flexed. Says that it finished. Happened a third time then she was brought to the hospital. Says the 3rd time  she fell forward onto the door. Then had a 4th episode. Where she tightened up and fell again to the ground. Each episode lasted approx 20 seconds.  All episodes happened within an approximately 5-minute span.    Seizures Fever  Past Medical History:  Diagnosis Date   Abdominal pain, epigastric 04/17/2013   Adjustment disorder with emotional disturbance    Alcoholic gastritis 12/3084    hospitalization   Dehydration 04/04/2013   Hematemesis 03/2013   along with alcoholic gastritis, hospitalization   Hematemesis- mild 04/04/2013   Intractable nausea, vomiting & abdominal pain. 05/02/8468   Metabolic acidosis 05/2951    hospitaliation due to alcohol abuse, dehyration, nausea/vomiting   Metabolic acidosis, increased anion gap 04/04/2013   Polysubstance abuse ([place])    Pyelonephritis 03/2010   hospitalization   Seizures ([place])    Stomach ulcer    never had any tests to confirm this diagni=oses      Home Medications Prior to Admission medications   Medication Sig Start Date End Date Taking? Authorizing Provider  iron polysaccharides (NIFEREX) 150 MG capsule Take 1 capsule (150 mg total) by mouth every other day. 11/06/20   Darlina Rumpf, CNM  fluticasone (FLONASE) 50 MCG/ACT nasal spray Place 1 spray into both nostrils daily. 12/13/18 07/06/20  Langston Masker B, PA-C  omeprazole (PRILOSEC) 20 MG capsule Take 1 capsule (20 mg total) by mouth daily. Patient not taking: Reported on 12/13/2018 01/28/17 07/06/20  Rancour, Annie Main, MD  sucralfate (CARAFATE) 1 g tablet Take 1 tablet (1 g total) by mouth 4 (four) times daily -  with meals and at bedtime. Patient not taking: Reported on 12/13/2018 01/28/17 07/06/20  Ezequiel Essex, MD      Allergies    Patient has no known allergies.    Review of Systems   Review of Systems  Constitutional:  Positive for fever.  Neurological:  Positive for seizures.    Physical Exam Updated Vital Signs BP 119/62 (BP Location: Right Arm)   Pulse 61   Temp  98.9 F (37.2 C) (Oral)   Resp 17   Ht 5' 2" (1.575 m)   Wt 64.2 kg   SpO2 99%   BMI 25.89 kg/m  Physical Exam Vitals and nursing note reviewed.  Constitutional:      General: She is not in acute distress.    Appearance: She is well-developed. She is ill-appearing.     Comments: Having rigors during exam.  HENT:     Head: Normocephalic and atraumatic.     Right Ear: External ear normal.     Left Ear: External ear normal.     Nose: Nose normal.     Mouth/Throat:     Mouth: Mucous membranes are moist.     Pharynx: Oropharynx is clear.  Eyes:     Conjunctiva/sclera: Conjunctivae normal.  Neck:     Comments: No meningismus Cardiovascular:     Rate and Rhythm: Normal rate and regular rhythm.     Heart sounds: No murmur heard. Pulmonary:     Effort: Pulmonary effort is normal. No respiratory distress.     Breath sounds: Normal breath sounds.  Abdominal:     Palpations: Abdomen is soft.     Tenderness: There is no abdominal tenderness.  Musculoskeletal:        General: No swelling.     Cervical back: Normal range of motion and neck supple. No rigidity.  Skin:    General: Skin is warm and dry.     Capillary Refill: Capillary refill takes less than 2 seconds.  Neurological:     Mental Status: She is alert.     Comments: MENTAL STATUS: AAOx3 CRANIAL NERVES: II: Pupils equal and reactive 4 mm BL, no RAPD III, IV, VI: EOM intact, no gaze preference or deviation, no nystagmus. V: normal sensation to light touch in V1, V2, and V3 segments bilaterally VII: no facial weakness or asymmetry, no nasolabial fold flattening VIII: normal hearing to speech and finger friction IX, X: normal palatal elevation, no uvular deviation XI: 5/5 head turn and 5/5 shoulder shrug bilaterally XII: midline tongue protrusion MOTOR: 5/5 strength in R shoulder flexion, elbow flexion and extension, and grip strength. 5/5 strength in L shoulder flexion, elbow flexion and extension, and grip strength.   5/5 strength in R hip and knee flexion, knee extension, ankle plantar and dorsiflexion. 5/5 strength in L hip and knee flexion, knee extension, ankle plantar and dorsiflexion. SENSORY: Normal sensation to light touch in all extremities COORD: Normal finger to nose and heel to shin, no dysmetria, having rigors during the exam which complicates evaluation for tremor STATION: no truncal ataxia   Psychiatric:        Mood and Affect: Mood normal.     ED Results / Procedures / Treatments   Labs (all labs ordered are listed, but only abnormal results are displayed) Labs Reviewed  COMPREHENSIVE METABOLIC PANEL - Abnormal; Notable for the following components:  Result Value   Sodium 130 (*)    Potassium 2.9 (*)    Chloride 89 (*)    AST 63 (*)    ALT 69 (*)    Anion gap 16 (*)    All other components within normal limits  CBC WITH DIFFERENTIAL/PLATELET - Abnormal; Notable for the following components:   Hemoglobin 10.8 (*)    HCT 32.3 (*)    MCV 78.8 (*)    RDW 21.5 (*)    All other components within normal limits  CSF CELL COUNT WITH DIFFERENTIAL - Abnormal; Notable for the following components:   Appearance, CSF CLEAR (*)    RBC Count, CSF 4 (*)    All other components within normal limits  COMPREHENSIVE METABOLIC PANEL - Abnormal; Notable for the following components:   Potassium 3.2 (*)    Calcium 8.3 (*)    Total Protein 6.3 (*)    Albumin 3.4 (*)    All other components within normal limits  CBC - Abnormal; Notable for the following components:   RBC 3.40 (*)    Hemoglobin 9.0 (*)    HCT 27.5 (*)    RDW 21.8 (*)    All other components within normal limits  CBG MONITORING, ED - Abnormal; Notable for the following components:   Glucose-Capillary 109 (*)    All other components within normal limits  RESP PANEL BY RT-PCR (FLU A&B, COVID) ARPGX2  CULTURE, BLOOD (SINGLE)  CSF CULTURE W GRAM STAIN  MAGNESIUM  PHOSPHORUS  ETHANOL  LIPASE, BLOOD  PROTEIN AND GLUCOSE,  CSF  HIV ANTIBODY (ROUTINE TESTING W REFLEX)  MENINGITIS/ENCEPHALITIS PANEL (CSF)  URINALYSIS, ROUTINE W REFLEX MICROSCOPIC  RAPID URINE DRUG SCREEN, HOSP PERFORMED  LYME DISEASE DNA BY PCR([facility])  MISC [facility] TEST (SEND OUT)  URINALYSIS, ROUTINE W REFLEX MICROSCOPIC  I-STAT BETA HCG BLOOD, ED ([facility], [facility], [facility] ONLY)    EKG EKG Interpretation  Date/Time:  Sunday August 08 2022 15:57:04 EDT Ventricular Rate:  83 PR Interval:  138 QRS Duration: 100 QT Interval:  493 QTC Calculation: 580 R Axis:   85 Text Interpretation: Interpretation limited secondary to artifact Sinus rhythm Biatrial enlargement Nonspecific T abnrm, anterolateral leads Prolonged QT interval Baseline wander in lead(s) V5 Confirmed by Margaretmary Eddy [phone]) on 08/08/2022 4:51:58 PM  Radiology DG CHEST PORT 1 VIEW  Result Date: 08/09/2022 CLINICAL DATA:  Seizures. EXAM: PORTABLE CHEST 1 VIEW COMPARISON:  July 06, 2020 FINDINGS: EKG leads and EG leads project over the chest. Cardiomediastinal contours and hilar structures are normal. Lungs are clear. On limited assessment no acute skeletal findings. IMPRESSION: No acute cardiopulmonary disease. Electronically Signed   By: Zetta Bills M.D.   On: 08/09/2022 09:26   Overnight EEG with video  Result Date: 08/09/2022 Deborah Havens, MD     08/09/2022 10:10 AM Patient Name: [name] MRN: [id number] Epilepsy Attending: Lora [name] Referring Physician/Provider: Kerney Elbe, MD Duration: 08/08/2022 1901 to 08/09/2022 1000 Patient history: 36 y.o. female with medical history significant of distant history of seizure, alcohol use, anemia presenting after seizures at home.  On EEG to evaluate for seizure. Level of alertness: Awake, asleep AEDs during EEG study: None Technical aspects: This EEG study was done with scalp electrodes positioned according to the 10-20 International system of electrode placement. Electrical activity was reviewed with band pass filter of  1-70Hz, sensitivity of 7 uV/mm, display speed of 33m/sec with a 60Hz notched filter applied as appropriate. EEG data were recorded continuously  and digitally stored.  Video monitoring was available and reviewed as appropriate. Description: The posterior dominant rhythm consists of 9-10 Hz activity of moderate voltage (25-35 uV) seen predominantly in posterior head regions, symmetric and reactive to eye opening and eye closing. Sleep was characterized by vertex waves, sleep spindles (12 to 14 Hz), maximal frontocentral region. Hyperventilation and photic stimulation were not performed.   IMPRESSION: This study is within normal limits. No seizures or epileptiform discharges were seen throughout the recording. A normal interictal EEG does not exclude nor support the diagnosis of epilepsy. [name]   CT Head Wo Contrast  Result Date: 08/08/2022 CLINICAL DATA:  Seizure. EXAM: CT HEAD WITHOUT CONTRAST TECHNIQUE: Contiguous axial images were obtained from the base of the skull through the vertex without intravenous contrast. RADIATION DOSE REDUCTION: This exam was performed according to the departmental dose-optimization program which includes automated exposure control, adjustment of the mA and/or kV according to patient size and/or use of iterative reconstruction technique. COMPARISON:  January 05, 2012 FINDINGS: Brain: No evidence of acute infarction, hemorrhage, hydrocephalus, extra-axial collection or mass lesion/mass effect. Vascular: No hyperdense vessel or unexpected calcification. Skull: Normal. Negative for fracture or focal lesion. Sinuses/Orbits: No acute finding. Other: None. IMPRESSION: Normal study.  No cause for symptoms identified. Electronically Signed   By: Dorise Bullion III M.D.   On: 08/08/2022 15:48    Procedures Procedures   Medications Ordered in ED Medications  acetaminophen (TYLENOL) tablet 1,000 mg ( Oral Canceled Entry 08/08/22 1459)  potassium chloride 10 mEq in 100 mL IVPB  (0 mEq Intravenous Stopped 08/08/22 2049)  chlorhexidine gluconate (MEDLINE KIT) (PERIDEX) 0.12 % solution 15 mL (15 mLs Mouth Rinse Given 08/08/22 2327)  Oral care mouth rinse (15 mLs Mouth Rinse Given 08/09/22 0553)  LORazepam (ATIVAN) injection 2 mg (has no administration in time range)  enoxaparin (LOVENOX) injection 40 mg (40 mg Subcutaneous Given 08/08/22 2250)  sodium chloride flush (NS) 0.9 % injection 3 mL (3 mLs Intravenous Given 08/08/22 2257)  acetaminophen (TYLENOL) tablet 650 mg (has no administration in time range)    Or  acetaminophen (TYLENOL) suppository 650 mg (has no administration in time range)  polyethylene glycol (MIRALAX / GLYCOLAX) packet 17 g (has no administration in time range)  pneumococcal 20-valent conjugate vaccine (PREVNAR 20) injection 0.5 mL (has no administration in time range)  ondansetron (ZOFRAN) injection 4 mg (4 mg Intravenous Given 08/09/22 0339)  potassium chloride 10 mEq in 100 mL IVPB (10 mEq Intravenous New Bag/Given 08/09/22 1042)  LORazepam (ATIVAN) injection 0.5 mg (0.5 mg Intravenous Given 08/08/22 1446)  lactated ringers bolus 1,000 mL (1,000 mLs Intravenous New Bag/Given 08/08/22 1909)  droperidol (INAPSINE) 2.5 MG/ML injection 1.25 mg (1.25 mg Intravenous Given 08/08/22 1459)  dexamethasone (DECADRON) injection 10 mg (10 mg Intravenous Given 08/08/22 1450)  cefTRIAXone (ROCEPHIN) 2 g in sodium chloride 0.9 % 100 mL IVPB (0 g Intravenous Stopped 08/08/22 1726)  lidocaine-EPINEPHrine (XYLOCAINE W/EPI) 2 %-1:200000 (PF) injection 10 mL (10 mLs Other Given by Other 08/08/22 1725)  acetaminophen (TYLENOL) suppository 650 mg (650 mg Rectal Given 08/08/22 1455)  vancomycin (VANCOREADY) IVPB 1500 mg/300 mL (0 mg Intravenous Stopped 08/08/22 2043)  ondansetron (ZOFRAN) injection 4 mg (4 mg Intravenous Given 08/08/22 2049)  promethazine (PHENERGAN) 12.5 mg in sodium chloride 0.9 % 50 mL IVPB (12.5 mg Intravenous New Bag/Given 08/09/22 1019)    ED Course/ Medical  Decision Making/ A&P Clinical Course as of 08/09/22 [place] Aug 08, 2022  1421  Consulted neurology.  [RP]  1425 Ordering antibiotics to cover for possible meningitis. [RP]  5374 Dr Cheral Marker from neurology recommends admitting the patient to medicine for EEG.  They have also ordered an MRI. [RP]  8270 Dr Trilby Drummer from internal medicine was consulted regarding admission and will come evaluate the patient.  [RP]    Clinical Course User Index [RP] Fransico Meadow, MD                           Medical Decision Making Amount and/or Complexity of Data Reviewed Labs: ordered. Radiology: ordered.  Risk OTC drugs. Prescription drug management. Decision regarding hospitalization.   36 year old female with a remote history of a single seizure, alcohol use, and marijuana use who presents emergency department with nausea, vomiting, and seizure like activity  Initial DDx: Seizures, CNS infection, epileptic seizure triggered by viral syndrome, alcohol withdrawal, ICH/TBI, cardiogenic syncope, viral gastroenteritis, COVID  Initially the differential is very broad for the patient.  Reports obtained by the patient's mother do appear to be consistent with possible seizures though there are several features of the history that are not typical.  For seizures within a 5-minute span lasting 20 seconds apart with return to baseline is highly unusual for seizure-like activity.  Additionally there is no bowel or bladder incontinence.  She also was noted to be walking while shaking in his tremulous on my exam which is more consistent with rigors.  It is possible that the patient could have been having severe rigors and then had a syncopal episode due to her severe dehydration or other cardiogenic cause of syncope.  Also on the differential is alcohol withdrawal syndromes though the patient does not report enough alcohol use that would typically trigger a withdrawal seizure.  With her fevers it is possible that  there is CNS infection as well which will need to be covered by antibiotics.  Plan:  Labs Electrolytes Urinalysis Chest x-ray Head CT COVID IV antibiotics Lumbar puncture   ED Summary:  Patient was empirically treated for meningitis with vancomycin, ceftriaxone, and acyclovir.  Feel that this is unlikely but it is possible that she could have this diagnosis given the fact that she does not have a diagnosed history of epilepsy.  CT head was obtained which did not show any acute abnormality.  Lumbar puncture was also performed and we are awaiting the results at this time.  Neurology was consulted who also felt that it was unclear if the patient was having seizures or not so she was put in for EEG as well as MRI.  With holding seizure medication at this time as it could potentially mask seizure-like activity which would be helpful to capture on EEG.  Patient was given fluids and a small dose of Ativan for her tremors and possible alcohol withdrawal.  EKG was obtained that did not show any cardiogenic cause of syncope and did not develop any signs of arrhythmia on telemetry.  Patient was then admitted to medicine for further management and evaluation of her seizure-like activity.  Consults: - Neuology    Additional history obtained from family Records reviewed Care Everywhere/External Records  I independently visualized the following imaging with scope of interpretation limited to determining acute life threatening conditions related to emergency care: CT head, which revealed no acute abnormality  Final Clinical Impression(s) / ED Diagnoses Final diagnoses:  Seizure-like activity ([place])  Viral illness  Fever, unspecified  Alcohol abuse    Rx /  DC Orders ED Discharge Orders     None         Fransico Meadow, MD 08/09/22 1048

## 2022-08-08 NOTE — H&P (Addendum)
History and Physical   [name] UKG:[id number] DOB: February 28, 1986 DOA: 08/08/2022  PCP: Pcp, No   Patient coming from: Home  Chief Complaint: Seizure  HPI: [name] is a 36 y.o. female with medical history significant of distant history of seizure, alcohol use, anemia presenting after seizures at home.  Patient reportedly had several episodes of seizure-like activity at home earlier today witnessed by her mother who is a Engineer, civil (consulting).  She recently was on a cruise about a week ago and since returning home she has had several days of fevers to around 100.1, sore throat, nausea, vomiting, cough with some associated epigastric pain, chills and fatigue.  Also reports headaches.  Reportedly her child has pneumonia.  Patient had been feeling better today but when she had gone to do something and exiting she knew she was waking up being told she had seizures.  Family reports that patient had 4 total shaking episodes lasting 20 seconds each all within around 5 minutes.  They were described as tonic-clonic with generalized shaking and stiffening in nature.  She reportedly would be confused right afterwards, round some and may be speak some and then have another episode immediately afterwards.  Patient does drink alcohol and had heavier alcohol use on her cruise but not very much since.  Typically she drinks 2-3 drinks 3-4 times a week.  Reports maybe is having some withdrawal type symptoms of tremor in the past.  She does have distant history of seizure in around 2013.  No seizure since that time not on any antiepileptics.  Notably there is no reported incontinence nor tongue biting.   She denies chest pain, shortness of breath, constipation, diarrhea.  ED Course: Vital signs in the ED significant for temperature of 100.1, respirate in the teens to 20s, blood pressure in the 100s to 130s systolic.  Lab work-up included CMP with sodium 130, potassium 2.9, chloride 89, AST 63, ALT 69.  CBC with  hemoglobin stable at 10.8.  Lipase normal.  Respiratory no flu and COVID-negative.  Ethanol level negative.  UDS and urinalysis pending. Labs and blood cultures pending.  LP has been performed in the ED and CSF studies pending include culture, Gram stain, cell count, protein, glucose, HSV, VZV, autoimmune encephalitis panel.  CT head showed no acute abnormality.  MR brain is pending.  Phos and magnesium were normal.  Patient received 30 mEq of IV potassium, Tylenol, vancomycin, ceftriaxone, acyclovir, Decadron a liter of fluids and 125 cc an hour fluids.  Neurology was consulted recommended the above LP, EEG, MRI, hold off on AEDs for now to try to capture any seizure-like activities as her story was not typical.  Review of Systems: As per HPI otherwise all other systems reviewed and are negative.  Past Medical History:  Diagnosis Date   Abdominal pain, epigastric 04/17/2013   Adjustment disorder with emotional disturbance    Alcoholic gastritis 03/2013    hospitalization   Dehydration 04/04/2013   Hematemesis 03/2013   along with alcoholic gastritis, hospitalization   Hematemesis- mild 04/04/2013   Intractable nausea, vomiting & abdominal pain. 04/04/2013   Metabolic acidosis 03/2013    hospitaliation due to alcohol abuse, dehyration, nausea/vomiting   Metabolic acidosis, increased anion gap 04/04/2013   Polysubstance abuse (HCC)    Pyelonephritis 03/2010   hospitalization   Seizures (HCC)    Stomach ulcer    never had any tests to confirm this diagni=oses    Past Surgical History:  Procedure Laterality Date  BREAST LUMPECTOMY Right    benign pathology   CESAREAN SECTION  2005   MOUTH SURGERY      Social History  reports that she has been smoking cigarettes. She has been smoking an average of .25 packs per day. She has never used smokeless tobacco. She reports current alcohol use. She reports current drug use. Drugs: Marijuana and Cocaine.  No Known Allergies  Family History  Problem  Relation Age of Onset   Diabetes Mother    Hypertension Father    Diabetes Maternal Aunt    Diabetes Maternal Uncle    Cancer Maternal Uncle    Diabetes Maternal Grandmother    Cancer Maternal Grandmother    Diabetes Maternal Grandfather   Reviewed on admission  Prior to Admission medications   Medication Sig Start Date End Date Taking? Authorizing Provider  iron polysaccharides (NIFEREX) 150 MG capsule Take 1 capsule (150 mg total) by mouth every other day. 11/06/20   Calvert Cantor, CNM  fluticasone (FLONASE) 50 MCG/ACT nasal spray Place 1 spray into both nostrils daily. 12/13/18 07/06/20  Aviva Kluver B, PA-C  omeprazole (PRILOSEC) 20 MG capsule Take 1 capsule (20 mg total) by mouth daily. Patient not taking: Reported on 12/13/2018 01/28/17 07/06/20  Glynn Octave, MD  sucralfate (CARAFATE) 1 g tablet Take 1 tablet (1 g total) by mouth 4 (four) times daily -  with meals and at bedtime. Patient not taking: Reported on 12/13/2018 01/28/17 07/06/20  Glynn Octave, MD    Physical Exam: Vitals:   08/08/22 1700 08/08/22 1715 08/08/22 1734 08/08/22 1815  BP: 130/89 108/72  101/61  Pulse: 65 73  80  Resp: (!) 26 20  (!) 22  Temp:   99.9 F (37.7 C)   TempSrc:   Oral   SpO2: 95% 97%  96%  Weight:      Height:        Physical Exam Constitutional:      General: She is not in acute distress.    Appearance: Normal appearance.  HENT:     Head: Normocephalic and atraumatic.     Mouth/Throat:     Mouth: Mucous membranes are moist.     Pharynx: Oropharynx is clear.  Eyes:     Extraocular Movements: Extraocular movements intact.     Pupils: Pupils are equal, round, and reactive to light.  Cardiovascular:     Rate and Rhythm: Normal rate and regular rhythm.     Pulses: Normal pulses.     Heart sounds: Normal heart sounds.  Pulmonary:     Effort: Pulmonary effort is normal. No respiratory distress.     Breath sounds: Normal breath sounds.  Abdominal:     General: Bowel  sounds are normal. There is no distension.     Palpations: Abdomen is soft.     Tenderness: There is no abdominal tenderness.  Musculoskeletal:        General: No swelling or deformity.  Skin:    General: Skin is warm and dry.  Neurological:     General: No focal deficit present.     Mental Status: Mental status is at baseline.    Labs on Admission: I have personally reviewed following labs and imaging studies  CBC: Recent Labs  Lab 08/08/22 1334  WBC 7.6  NEUTROABS 5.4  HGB 10.8*  HCT 32.3*  MCV 78.8*  PLT 277    Basic Metabolic Panel: Recent Labs  Lab 08/08/22 1334  NA 130*  K 2.9*  CL 89*  CO2 25  GLUCOSE 97  BUN 7  CREATININE 0.81  CALCIUM 9.2  MG 1.8  PHOS 2.6    GFR: Estimated Creatinine Clearance: 84.9 mL/min (by C-G formula based on SCr of 0.81 mg/dL).  Liver Function Tests: Recent Labs  Lab 08/08/22 1334  AST 63*  ALT 69*  ALKPHOS 78  BILITOT 0.6  PROT 7.7  ALBUMIN 4.5    Urine analysis:    Component Value Date/Time   COLORURINE YELLOW 05/22/2021 1430   APPEARANCEUR HAZY (A) 05/22/2021 1430   LABSPEC 1.030 05/22/2021 1430   PHURINE 6.0 05/22/2021 1430   GLUCOSEU NEGATIVE 05/22/2021 1430   HGBUR SMALL (A) 05/22/2021 1430   BILIRUBINUR NEGATIVE 05/22/2021 1430   KETONESUR 5 (A) 05/22/2021 1430   PROTEINUR 30 (A) 05/22/2021 1430   UROBILINOGEN 0.2 07/24/2014 1506   NITRITE NEGATIVE 05/22/2021 1430   LEUKOCYTESUR TRACE (A) 05/22/2021 1430    Radiological Exams on Admission: CT Head Wo Contrast  Result Date: 08/08/2022 CLINICAL DATA:  Seizure. EXAM: CT HEAD WITHOUT CONTRAST TECHNIQUE: Contiguous axial images were obtained from the base of the skull through the vertex without intravenous contrast. RADIATION DOSE REDUCTION: This exam was performed according to the departmental dose-optimization program which includes automated exposure control, adjustment of the mA and/or kV according to patient size and/or use of iterative reconstruction  technique. COMPARISON:  January 05, 2012 FINDINGS: Brain: No evidence of acute infarction, hemorrhage, hydrocephalus, extra-axial collection or mass lesion/mass effect. Vascular: No hyperdense vessel or unexpected calcification. Skull: Normal. Negative for fracture or focal lesion. Sinuses/Orbits: No acute finding. Other: None. IMPRESSION: Normal study.  No cause for symptoms identified. Electronically Signed   By: Gerome Sam III M.D.   On: 08/08/2022 15:48    EKG: Independently reviewed.  Sinus rhythm at 83 bpm.  Significant baseline wander.  Some nonspecific T wave abnormalities likely influenced by baseline wander.  QTc prolonged at 580.  Assessment/Plan Principal Problem:   Seizure (HCC) Active Problems:   Hypokalemia   Alcohol dependence (HCC)   Anemia   Hyponatremia   Transaminitis   Prolonged QT interval   Seizure Rule out meningitis > Patient presenting after several seizure episodes at home all lasting 20 seconds in all occurring within 5 minutes total.  Episodes consisted of generalized shaking activity and was followed by confusion with some talking before repeat episode. > Complicated by patient having recent viral syndrome and temperature to 100.1 in the ED.  Concern for possible meningitis needing to be ruled out.  LP has been obtained in the ED and labs are pending.  Patient has received vancomycin, ceftriaxone, acyclovir as well as Decadron in the ED. >  patient does have history of alcohol use but only has had minimal alcohol since she got home from a cruise week ago where she lived more heavily.  No clear history of withdrawal but has had some shaking possibly in the past. >  Neurology was consulted recommended the above LP, EEG, MRI, hold off on AEDs for now to try to capture any seizure-like activities as her story was not typical. - Monitor on telemetry - Appreciate neurology recommendations - MRI - EEG - As needed Ativan for any breakthrough seizures - Follow-up LP  studies: Culture, Gram stain, cell count, protein, glucose, HSV, VZV, autoimmune encephalitis panel - Continue with ceftriaxone, vancomycin, acyclovir for now - Follow-up Lyme labs and blood cultures as well as urinalysis and urine cultures. - Can redose Decadron if results come back positive for evidence of  strep pneumo  Hyponatremia Hypokalemia > Mild hyponatremia at 130 and hypokalemia at 2.9 in ED. > Has received 30 mill equivalents IV potassium.  Mag and phosphorus were normal. - We will continue with IV fluids for hyponatremia in the setting of nausea and vomiting - We will add 30 mEq of potassium -Renal function and electrolytes  Transaminitis > Mild transaminitis with AST of 63 and ALT of 69.  Has had some intermittent transaminitis in the past per chart review. -Trend LFTs  Prolonged QT > QTc 580 in ED, avoiding antiemetics and other QT prolonging meds if able. - Recheck EKG - Trial of IV benadryl for Nausea  Anemia > Heme globin stable at 10.8 -Trend CBC  Alcohol use > Unclear if any true history of withdrawal, however watching more closely considering presenting with possible seizures. - CIWA without Ativan for now  DVT prophylaxis: Lovenox Code Status:   Full Family Communication:  None on admission.  Mother was updated earlier as she, the patient to the ED and was present during patient's episodes. Disposition Plan:   Patient is from:  Home  Anticipated DC to:  Home  Anticipated DC date:  1 to 3 days  Anticipated DC barriers: None  Consults called:  Neurology, consulted in the ED, will see the patient. Admission status:  Observation, telemetry  Severity of Illness: The appropriate patient status for this patient is OBSERVATION. Observation status is judged to be reasonable and necessary in order to provide the required intensity of service to ensure the patient's safety. The patient's presenting symptoms, physical exam findings, and initial radiographic and  laboratory data in the context of their medical condition is felt to place them at decreased risk for further clinical deterioration. Furthermore, it is anticipated that the patient will be medically stable for discharge from the hospital within 2 midnights of admission.    Synetta Fail MD [facility]  How to contact the [facility] Attending or Consulting provider 7A - 7P or covering provider during after hours 7P -7A, for this patient?   Check the care team in [facility] and look for a) attending/consulting [facility] provider listed and b) the [facility] team listed Log into [email or web address] and use [place]'s universal password to access. If you do not have the password, please contact the hospital operator. Locate the [facility] provider you are looking for under [facility] and page to a number that you can be directly reached. If you still have difficulty reaching the provider, please page the [facility] (Director on Call) for the Hospitalists listed on amion for assistance.  08/08/2022, 8:36 PM

## 2022-08-08 NOTE — Progress Notes (Signed)
Per RN they are setting up for LP, check back in 1 hour.

## 2022-08-08 NOTE — Progress Notes (Signed)
LTM EEG hooked up and running - no initial skin breakdown - push button tested - neuro notified. [facility] monitoring.  

## 2022-08-08 NOTE — ED Notes (Signed)
ED TO INPATIENT HANDOFF REPORT  ED Nurse Name and Phone #: [phone]  S Name/Age/Gender [name] 36 y.o. female Room/Bed: 014C/014C  Code Status   Code Status: Full Code  Home/SNF/Other Home Patient oriented to: self, place, time, and situation Is this baseline? Yes   Triage Complete: Triage complete  Chief Complaint Seizure [facility]) [R56.9]  Triage Note Pt here via [facility] from home  for seizures.  Hx of only 1 seizure previously - pt is on no seizure meds.  Temp of 100.1 with chills, sore throat, cough, vomiting for several days.  States her child has pneumonia (no covid).    Per family, pt started shaking and was lowered to the floor.  4 -30 sec "full body" seizures.  Per ems, pt was post ictal.  VS stable.     Pt ao x 4.  Returned from [place] on Monday  Pt does drink alcohol daily and was feeling good enough to drink 2 shots this am.   Allergies No Known Allergies  Level of Care/Admitting Diagnosis ED Disposition     ED Disposition  Admit   Condition  --   [place]: [facility] [[id number]]  Level of Care: Telemetry Medical [104]  May place patient in observation at [facility] or [place] if equivalent level of care is available:: No  Covid Evaluation: Asymptomatic - no recent exposure (last 10 days) testing not required  Diagnosis: Seizure [facility]) [[id number]]  Admitting Physician: Marcelyn Bruins [[id number]]  Attending Physician: Marcelyn Bruins [[id number]]          B Medical/Surgery History Past Medical History:  Diagnosis Date   Abdominal pain, epigastric 04/17/2013   Adjustment disorder with emotional disturbance    Alcoholic gastritis 12/9620    hospitalization   Dehydration 04/04/2013   Hematemesis 03/2013   along with alcoholic gastritis, hospitalization   Hematemesis- mild 04/04/2013   Intractable nausea, vomiting & abdominal pain. 02/05/7988   Metabolic acidosis 01/1193    hospitaliation due to alcohol abuse,  dehyration, nausea/vomiting   Metabolic acidosis, increased anion gap 04/04/2013   Polysubstance abuse ([place])    Pyelonephritis 03/2010   hospitalization   Seizures ([place])    Stomach ulcer    never had any tests to confirm this diagni=oses   Past Surgical History:  Procedure Laterality Date   BREAST LUMPECTOMY Right    benign pathology   CESAREAN SECTION  2005   MOUTH SURGERY       A IV Location/Drains/Wounds Patient Lines/Drains/Airways Status     Active Line/Drains/Airways     Name Placement date Placement time Site Days   Peripheral IV 08/08/22 22 G Left;Posterior Wrist 08/08/22  --  Wrist  less than 1   Peripheral IV 08/08/22 20 G Posterior;Right Forearm 08/08/22  1511  Forearm  less than 1            Intake/Output Last 24 hours  Intake/Output Summary (Last 24 hours) at 08/08/2022 1910 Last data filed at 08/08/2022 1831 Gross per 24 hour  Intake 213.1 ml  Output --  Net 213.1 ml    Labs/Imaging Results for orders placed or performed during the hospital encounter of 08/08/22 (from the past 48 hour(s))  CBG monitoring, ED     Status: Abnormal   Collection Time: 08/08/22  1:23 PM  Result Value Ref Range   Glucose-Capillary 109 (H) 70 - 99 mg/dL    Comment: Glucose reference range applies only to samples taken after fasting for  at least 8 hours.  Comprehensive metabolic panel     Status: Abnormal   Collection Time: 08/08/22  1:34 PM  Result Value Ref Range   Sodium 130 (L) 135 - 145 mmol/L   Potassium 2.9 (L) 3.5 - 5.1 mmol/L   Chloride 89 (L) 98 - 111 mmol/L   CO2 25 22 - 32 mmol/L   Glucose, Bld 97 70 - 99 mg/dL    Comment: Glucose reference range applies only to samples taken after fasting for at least 8 hours.   BUN 7 6 - 20 mg/dL   Creatinine, Ser 0.81 0.44 - 1.00 mg/dL   Calcium 9.2 8.9 - 10.3 mg/dL   Total Protein 7.7 6.5 - 8.1 g/dL   Albumin 4.5 3.5 - 5.0 g/dL   AST 63 (H) 15 - 41 U/L   ALT 69 (H) 0 - 44 U/L   Alkaline Phosphatase 78 38 - 126 U/L    Total Bilirubin 0.6 0.3 - 1.2 mg/dL   GFR, Estimated >60 >60 mL/min    Comment: (NOTE) Calculated using the CKD-EPI Creatinine Equation (2021)    Anion gap 16 (H) 5 - 15    Comment: Performed at [facility], [place] [address] [place], [place] [address]  CBC with Differential/Platelet     Status: Abnormal   Collection Time: 08/08/22  1:34 PM  Result Value Ref Range   WBC 7.6 4.0 - 10.5 K/uL   RBC 4.10 3.87 - 5.11 MIL/uL   Hemoglobin 10.8 (L) 12.0 - 15.0 g/dL   HCT 32.3 (L) 36.0 - 46.0 %   MCV 78.8 (L) 80.0 - 100.0 fL   MCH 26.3 26.0 - 34.0 pg   MCHC 33.4 30.0 - 36.0 g/dL   RDW 21.5 (H) 11.5 - 15.5 %   Platelets 277 150 - 400 K/uL    Comment: REPEATED TO VERIFY   nRBC 0.0 0.0 - 0.2 %   Neutrophils Relative % 72 %   Neutro Abs 5.4 1.7 - 7.7 K/uL   Lymphocytes Relative 17 %   Lymphs Abs 1.3 0.7 - 4.0 K/uL   Monocytes Relative 11 %   Monocytes Absolute 0.9 0.1 - 1.0 K/uL   Eosinophils Relative 0 %   Eosinophils Absolute 0.0 0.0 - 0.5 K/uL   Basophils Relative 0 %   Basophils Absolute 0.0 0.0 - 0.1 K/uL   WBC Morphology MORPHOLOGY UNREMARKABLE    RBC Morphology MORPHOLOGY UNREMARKABLE    Smear Review MORPHOLOGY UNREMARKABLE    Immature Granulocytes 0 %   Abs Immature Granulocytes 0.02 0.00 - 0.07 K/uL    Comment: Performed at [facility], [place] [address] [place], [place] [address]  Magnesium     Status: None   Collection Time: 08/08/22  1:34 PM  Result Value Ref Range   Magnesium 1.8 1.7 - 2.4 mg/dL    Comment: Performed at [facility] [address] [place], [place] [address]  Phosphorus     Status: None   Collection Time: 08/08/22  1:34 PM  Result Value Ref Range   Phosphorus 2.6 2.5 - 4.6 mg/dL    Comment: Performed at [facility] [address], [place], [place] [address]  Lipase, blood     Status: None   Collection Time: 08/08/22  1:34 PM  Result Value Ref Range   Lipase 21 11 - 51 U/L    Comment: Performed at [facility] [address] [place],  [address]  Ethanol     Status: None   Collection Time: 08/08/22  1:38 PM  Result Value Ref Range   Alcohol, Ethyl (B) <10 <10 mg/dL    Comment: (NOTE) Lowest detectable limit for serum alcohol is 10 mg/dL.  For medical purposes only. Performed at [facility], [place] [address], [place], [place] [address]   I-Stat beta hCG blood, ED     Status: None   Collection Time: 08/08/22  1:51 PM  Result Value Ref Range   I-stat hCG, quantitative <5.0 <5 mIU/mL   Comment 3            Comment:   GEST. AGE      CONC.  (mIU/mL)   <=1 WEEK        5 - 50     2 WEEKS       50 - 500     3 WEEKS       100 - 10,000     4 WEEKS     1,000 - 30,000        FEMALE AND NON-PREGNANT FEMALE:     LESS THAN 5 mIU/mL   Resp Panel by RT-PCR (Flu A&B, Covid) Anterior Nasal Swab     Status: None   Collection Time: 08/08/22  3:03 PM   Specimen: Anterior Nasal Swab  Result Value Ref Range   SARS Coronavirus 2 by RT PCR NEGATIVE NEGATIVE    Comment: (NOTE) SARS-CoV-2 target nucleic acids are NOT DETECTED.  The SARS-CoV-2 RNA is generally detectable in upper respiratory specimens during the acute phase of infection. The lowest concentration of SARS-CoV-2 viral copies this assay can detect is 138 copies/mL. A negative result does not preclude SARS-Cov-2 infection and should not be used as the sole basis for treatment or other patient management decisions. A negative result may occur with  improper specimen collection/handling, submission of specimen other than nasopharyngeal swab, presence of viral mutation(s) within the areas targeted by this assay, and inadequate number of viral copies(<138 copies/mL). A negative result must be combined with clinical observations, patient history, and epidemiological information. The expected result is Negative.  Fact Sheet for Patients:  EntrepreneurPulse.com.au  Fact Sheet for Healthcare Providers:   IncredibleEmployment.be  This test is no t yet approved or cleared by the [place] FDA and  has been authorized for detection and/or diagnosis of SARS-CoV-2 by FDA under an Emergency Use Authorization (EUA). This EUA will remain  in effect (meaning this test can be used) for the duration of the COVID-19 declaration under Section 564(b)(1) of the Act, 21 U.S.C.section 360bbb-3(b)(1), unless the authorization is terminated  or revoked sooner.       Influenza A by PCR NEGATIVE NEGATIVE   Influenza B by PCR NEGATIVE NEGATIVE    Comment: (NOTE) The Xpert Xpress SARS-CoV-2/FLU/RSV plus assay is intended as an aid in the diagnosis of influenza from Nasopharyngeal swab specimens and should not be used as a sole basis for treatment. Nasal washings and aspirates are unacceptable for Xpert Xpress SARS-CoV-2/FLU/RSV testing.  Fact Sheet for Patients: EntrepreneurPulse.com.au  Fact Sheet for Healthcare Providers: IncredibleEmployment.be  This test is not yet approved or cleared by the [place] FDA and has been authorized for detection and/or diagnosis of SARS-CoV-2 by FDA under an Emergency Use Authorization (EUA). This EUA will remain in effect (meaning this test can be used) for the duration of the COVID-19 declaration under Section 564(b)(1) of the Act, 21 U.S.C. section 360bbb-3(b)(1), unless the authorization is terminated  or revoked.  Performed at [facility], [place] [address] [place], [place] [address]    CT Head Wo Contrast  Result Date: 08/08/2022 CLINICAL DATA:  Seizure. EXAM: CT HEAD WITHOUT CONTRAST TECHNIQUE: Contiguous axial images were obtained from the base of the skull through the vertex without intravenous contrast. RADIATION DOSE REDUCTION: This exam was performed according to the departmental dose-optimization program which includes automated exposure control, adjustment of the mA and/or kV  according to patient size and/or use of iterative reconstruction technique. COMPARISON:  January 05, 2012 FINDINGS: Brain: No evidence of acute infarction, hemorrhage, hydrocephalus, extra-axial collection or mass lesion/mass effect. Vascular: No hyperdense vessel or unexpected calcification. Skull: Normal. Negative for fracture or focal lesion. Sinuses/Orbits: No acute finding. Other: None. IMPRESSION: Normal study.  No cause for symptoms identified. Electronically Signed   By: Dorise Bullion III M.D.   On: 08/08/2022 15:48    Pending Labs Unresulted Labs (From admission, onward)     Start     Ordered   08/15/22 0500  Creatinine, serum  (enoxaparin (LOVENOX)    CrCl >/= 30 ml/min)  Weekly,   R     Comments: while on enoxaparin therapy    08/08/22 1827   08/09/22 0500  Comprehensive metabolic panel  Tomorrow morning,   R        08/08/22 1827   08/09/22 0500  CBC  Tomorrow morning,   R        08/08/22 1827   08/08/22 1823  HIV Antibody (routine testing w rflx)  (HIV Antibody (Routine testing w reflex) panel)  Once,   R        08/08/22 1827   08/08/22 1736  CSF cell count with differential  ONCE - STAT,   STAT       Question:  Are there also cytology or pathology orders on this specimen?  Answer:  Yes   08/08/22 1736   08/08/22 1736  Protein and glucose, CSF  ONCE - STAT,   STAT        08/08/22 1736   08/08/22 1736  CSF culture w Gram Stain  ONCE - URGENT,   URGENT       Question:  Are there also cytology or pathology orders on this specimen?  Answer:  Yes   08/08/22 1736   08/08/22 1736  HSV 1/2 PCR, CSF (reference lab) Cerebrospinal Fluid  ONCE - STAT,   URGENT        08/08/22 1736   08/08/22 1736  Lyme disease dna by pcr(borrelia burg)  ONCE - STAT,   URGENT        08/08/22 1736   08/08/22 1736  VZV PCR, CSF  ONCE - STAT,   URGENT        08/08/22 1736   08/08/22 1736  Autoimmune Encephalitis Panel (Miscellaneous [facility] Send out)  Once,   URGENT       Comments:  [email or web address]   Question:  Test name / description:  Answer:  Test ID: ENC2. Encephalopathy, Autoimmune Evaluation, Spinal Fluid   08/08/22 1736   08/08/22 1426  Culture, blood (single)  (Undifferentiated -> Now sepsis confirmed (treatment and sepsis specific nursing orders))  ONCE - STAT,   STAT        08/08/22 1425   08/08/22 1338  Urinalysis, Routine w reflex microscopic  Once,   URGENT        08/08/22 1338   08/08/22 1338  Rapid urine drug screen (hospital performed)  Once,  STAT        08/08/22 1338            Vitals/Pain Today's Vitals   08/08/22 1715 08/08/22 1734 08/08/22 1734 08/08/22 1815  BP: 108/72   101/61  Pulse: 73   80  Resp: 20   (!) 22  Temp:   99.9 F (37.7 C)   TempSrc:   Oral   SpO2: 97%   96%  Weight:      Height:      PainSc:  7       Isolation Precautions Airborne and Contact precautions  Medications Medications  acetaminophen (TYLENOL) tablet 1,000 mg ( Oral Canceled Entry 08/08/22 1459)  acyclovir (ZOVIRAX) 635 mg in dextrose 5 % 100 mL IVPB (0 mg Intravenous Stopped 08/08/22 1831)  0.9 %  sodium chloride infusion ( Intravenous New Bag/Given 08/08/22 1733)  vancomycin (VANCOREADY) IVPB 1500 mg/300 mL (1,500 mg Intravenous New Bag/Given 08/08/22 1731)    Followed by  vancomycin (VANCOCIN) IVPB 750 mg/150 ml premix (has no administration in time range)  potassium chloride 10 mEq in 100 mL IVPB (10 mEq Intravenous New Bag/Given 08/08/22 1854)  lidocaine (PF) (XYLOCAINE) 1 % injection 10 mL (10 mLs Infiltration Not Given 08/08/22 1855)  midazolam (VERSED) injection 2 mg (has no administration in time range)  cefTRIAXone (ROCEPHIN) 2 g in sodium chloride 0.9 % 100 mL IVPB (has no administration in time range)  chlorhexidine gluconate (MEDLINE KIT) (PERIDEX) 0.12 % solution 15 mL (has no administration in time range)  Oral care mouth rinse (has no administration in time range)  LORazepam (ATIVAN) injection 2  mg (has no administration in time range)  enoxaparin (LOVENOX) injection 40 mg (has no administration in time range)  sodium chloride flush (NS) 0.9 % injection 3 mL (has no administration in time range)  acetaminophen (TYLENOL) tablet 650 mg (has no administration in time range)    Or  acetaminophen (TYLENOL) suppository 650 mg (has no administration in time range)  polyethylene glycol (MIRALAX / GLYCOLAX) packet 17 g (has no administration in time range)  potassium chloride 10 mEq in 100 mL IVPB (has no administration in time range)  LORazepam (ATIVAN) injection 0.5 mg (0.5 mg Intravenous Given 08/08/22 1446)  lactated ringers bolus 1,000 mL (1,000 mLs Intravenous New Bag/Given 08/08/22 1909)  droperidol (INAPSINE) 2.5 MG/ML injection 1.25 mg (1.25 mg Intravenous Given 08/08/22 1459)  dexamethasone (DECADRON) injection 10 mg (10 mg Intravenous Given 08/08/22 1450)  cefTRIAXone (ROCEPHIN) 2 g in sodium chloride 0.9 % 100 mL IVPB (0 g Intravenous Stopped 08/08/22 1726)  lidocaine-EPINEPHrine (XYLOCAINE W/EPI) 2 %-1:200000 (PF) injection 10 mL (10 mLs Other Given by Other 08/08/22 1725)  acetaminophen (TYLENOL) suppository 650 mg (650 mg Rectal Given 08/08/22 1455)    Mobility walks     Focused Assessments        R Recommendations: See Admitting Provider Note  Report given to:   Additional Notes:

## 2022-08-08 NOTE — ED Notes (Signed)
Seizure pads set up around the patient's bed and suction set up at bedside for seizure precautions.

## 2022-08-08 NOTE — Progress Notes (Signed)
Pharmacy Antibiotic Note  [name] is a 36 y.o. female for which pharmacy has been consulted for acyclovir and vancomycin dosing for meningitis.  Patient with a history of single seizure, alcohol use, and marijuana use. Patient presenting with seizure and fever.  SCr 0.81 WBC 7.6; T 100.1 F; HR 73; RR 21  Plan: Acyclovir 10mg /kg q8h unless change in renal function --NS at 157ml/hr for renal ADE prevention per protocol after IV sepsis bundle fluids are completed Vancomycin 1500mg  once then 750 mg q8h per vancomycin nomogram dosing unless change in renal function Rocephin x 1 dose ordered per EDP - f/u maintenance orders from admitting MD Trend WBC, Fever, Renal function, & Clinical course F/u cultures, clinical course, WBC, fever De-escalate when able Levels at steady state  Height: 5\' 2"  (157.5 cm) Weight: 63.5 kg (140 lb) IBW/kg (Calculated) : 50.1  Temp (24hrs), Avg:100.1 F (37.8 C), Min:100.1 F (37.8 C), Max:100.1 F (37.8 C)  Recent Labs  Lab 08/08/22 1334  WBC 7.6  CREATININE 0.81    Estimated Creatinine Clearance: 84.9 mL/min (by C-G formula based on SCr of 0.81 mg/dL).    No Known Allergies  Antimicrobials this admission: vancomycin 8/13 >>  rocephin 8/13 >>  acyclovir 8/13 >>   Microbiology results: Pending  Thank you for allowing pharmacy to be a part of this patient's care.  9/13, PharmD, BCPS 08/08/2022 3:05 PM ED Clinical Pharmacist -  [phone]
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
## 2022-08-08 NOTE — ED Triage Notes (Addendum)
Pt here via [facility] from home  for seizures.  Hx of only 1 seizure previously - pt is on no seizure meds.  Temp of 100.1 with chills, sore throat, cough, vomiting for several days.  States her child has pneumonia (no covid).    Per family, pt started shaking and was lowered to the floor.  4 -30 sec "full body" seizures.  Per ems, pt was post ictal.  VS stable.     Pt ao x 4.  Returned from [place] on Monday  Pt does drink alcohol daily and was feeling good enough to drink 2 shots this am.

## 2022-08-08 NOTE — Plan of Care (Signed)

## 2022-08-09 ENCOUNTER — Observation Stay (HOSPITAL_COMMUNITY): Payer: No Typology Code available for payment source

## 2022-08-09 ENCOUNTER — Inpatient Hospital Stay (HOSPITAL_COMMUNITY): Payer: No Typology Code available for payment source

## 2022-08-09 DIAGNOSIS — F1290 Cannabis use, unspecified, uncomplicated: Secondary | ICD-10-CM | POA: Diagnosis present

## 2022-08-09 DIAGNOSIS — Z20822 Contact with and (suspected) exposure to covid-19: Secondary | ICD-10-CM | POA: Diagnosis present

## 2022-08-09 DIAGNOSIS — R9431 Abnormal electrocardiogram [ECG] [EKG]: Secondary | ICD-10-CM | POA: Diagnosis present

## 2022-08-09 DIAGNOSIS — Z8249 Family history of ischemic heart disease and other diseases of the circulatory system: Secondary | ICD-10-CM | POA: Diagnosis not present

## 2022-08-09 DIAGNOSIS — Z8711 Personal history of peptic ulcer disease: Secondary | ICD-10-CM | POA: Diagnosis not present

## 2022-08-09 DIAGNOSIS — Z23 Encounter for immunization: Secondary | ICD-10-CM | POA: Diagnosis not present

## 2022-08-09 DIAGNOSIS — D649 Anemia, unspecified: Secondary | ICD-10-CM | POA: Diagnosis present

## 2022-08-09 DIAGNOSIS — A599 Trichomoniasis, unspecified: Secondary | ICD-10-CM | POA: Diagnosis present

## 2022-08-09 DIAGNOSIS — F1029 Alcohol dependence with unspecified alcohol-induced disorder: Secondary | ICD-10-CM | POA: Diagnosis not present

## 2022-08-09 DIAGNOSIS — W19XXXA Unspecified fall, initial encounter: Secondary | ICD-10-CM | POA: Diagnosis present

## 2022-08-09 DIAGNOSIS — F10239 Alcohol dependence with withdrawal, unspecified: Secondary | ICD-10-CM | POA: Diagnosis present

## 2022-08-09 DIAGNOSIS — R509 Fever, unspecified: Secondary | ICD-10-CM

## 2022-08-09 DIAGNOSIS — F17210 Nicotine dependence, cigarettes, uncomplicated: Secondary | ICD-10-CM | POA: Diagnosis present

## 2022-08-09 DIAGNOSIS — Z79899 Other long term (current) drug therapy: Secondary | ICD-10-CM | POA: Diagnosis not present

## 2022-08-09 DIAGNOSIS — N390 Urinary tract infection, site not specified: Secondary | ICD-10-CM | POA: Diagnosis present

## 2022-08-09 DIAGNOSIS — Z833 Family history of diabetes mellitus: Secondary | ICD-10-CM | POA: Diagnosis not present

## 2022-08-09 DIAGNOSIS — R569 Unspecified convulsions: Secondary | ICD-10-CM | POA: Diagnosis present

## 2022-08-09 DIAGNOSIS — N3001 Acute cystitis with hematuria: Secondary | ICD-10-CM

## 2022-08-09 DIAGNOSIS — R319 Hematuria, unspecified: Secondary | ICD-10-CM | POA: Diagnosis present

## 2022-08-09 DIAGNOSIS — Z809 Family history of malignant neoplasm, unspecified: Secondary | ICD-10-CM | POA: Diagnosis not present

## 2022-08-09 DIAGNOSIS — E876 Hypokalemia: Secondary | ICD-10-CM | POA: Diagnosis present

## 2022-08-09 DIAGNOSIS — R7989 Other specified abnormal findings of blood chemistry: Secondary | ICD-10-CM | POA: Diagnosis present

## 2022-08-09 DIAGNOSIS — K760 Fatty (change of) liver, not elsewhere classified: Secondary | ICD-10-CM | POA: Diagnosis present

## 2022-08-09 DIAGNOSIS — E871 Hypo-osmolality and hyponatremia: Secondary | ICD-10-CM | POA: Diagnosis present

## 2022-08-09 LAB — COMPREHENSIVE METABOLIC PANEL
ALT: 44 U/L (ref 0–44)
AST: 36 U/L (ref 15–41)
Albumin: 3.4 g/dL — ABNORMAL LOW (ref 3.5–5.0)
Alkaline Phosphatase: 63 U/L (ref 38–126)
Anion gap: 13 (ref 5–15)
BUN: 7 mg/dL (ref 6–20)
CO2: 22 mmol/L (ref 22–32)
Calcium: 8.3 mg/dL — ABNORMAL LOW (ref 8.9–10.3)
Chloride: 100 mmol/L (ref 98–111)
Creatinine, Ser: 0.71 mg/dL (ref 0.44–1.00)
GFR, Estimated: >60 mL/min (ref >60)
Glucose, Bld: 75 mg/dL (ref 70–99)
Potassium: 3.2 mmol/L — ABNORMAL LOW (ref 3.5–5.1)
Sodium: 135 mmol/L (ref 135–145)
Total Bilirubin: 0.5 mg/dL (ref 0.3–1.2)
Total Protein: 6.3 g/dL — ABNORMAL LOW (ref 6.5–8.1)

## 2022-08-09 LAB — URINALYSIS, ROUTINE W REFLEX MICROSCOPIC
Bilirubin Urine: NEGATIVE
Glucose, UA: NEGATIVE mg/dL
Ketones, ur: 20 mg/dL — AB
Nitrite: NEGATIVE
Protein, ur: 30 mg/dL — AB
RBC / HPF: >50 RBC/hpf — ABNORMAL HIGH (ref 0–5)
Specific Gravity, Urine: 1.026 (ref 1.005–1.030)
pH: 6 (ref 5.0–8.0)

## 2022-08-09 LAB — LIPASE, BLOOD: Lipase: 25 U/L (ref 11–51)

## 2022-08-09 LAB — CBC
HCT: 27.5 % — ABNORMAL LOW (ref 36.0–46.0)
Hemoglobin: 9 g/dL — ABNORMAL LOW (ref 12.0–15.0)
MCH: 26.5 pg (ref 26.0–34.0)
MCHC: 32.7 g/dL (ref 30.0–36.0)
MCV: 80.9 fL (ref 80.0–100.0)
Platelets: 275 10*3/uL (ref 150–400)
RBC: 3.4 MIL/uL — ABNORMAL LOW (ref 3.87–5.11)
RDW: 21.8 % — ABNORMAL HIGH (ref 11.5–15.5)
WBC: 8.1 10*3/uL (ref 4.0–10.5)
nRBC: 0 % (ref 0.0–0.2)

## 2022-08-09 LAB — RAPID URINE DRUG SCREEN, HOSP PERFORMED
Amphetamines: NOT DETECTED
Barbiturates: NOT DETECTED
Benzodiazepines: NOT DETECTED
Cocaine: NOT DETECTED
Opiates: NOT DETECTED
Tetrahydrocannabinol: POSITIVE — AB

## 2022-08-09 MED ORDER — BISACODYL 10 MG RE SUPP
10.0000 mg | Freq: Every day | RECTAL | Status: DC
  Administered 2022-08-09: 10 mg via RECTAL
  Filled 2022-08-09 (×2): qty 1

## 2022-08-09 MED ORDER — POTASSIUM CHLORIDE 10 MEQ/100ML IV SOLN
10.0000 meq | INTRAVENOUS | Status: AC
  Administered 2022-08-09 (×4): 10 meq via INTRAVENOUS
  Filled 2022-08-09 (×4): qty 100

## 2022-08-09 MED ORDER — SODIUM CHLORIDE 0.9 % IV SOLN
1.0000 g | INTRAVENOUS | Status: DC
  Administered 2022-08-09 – 2022-08-10 (×2): 1 g via INTRAVENOUS
  Filled 2022-08-09 (×2): qty 10

## 2022-08-09 MED ORDER — METRONIDAZOLE 500 MG/100ML IV SOLN
500.0000 mg | Freq: Two times a day (BID) | INTRAVENOUS | Status: DC
  Administered 2022-08-09 – 2022-08-11 (×4): 500 mg via INTRAVENOUS
  Filled 2022-08-09 (×4): qty 100

## 2022-08-09 MED ORDER — ONDANSETRON HCL 4 MG/2ML IJ SOLN
4.0000 mg | Freq: Four times a day (QID) | INTRAMUSCULAR | Status: DC | PRN
  Administered 2022-08-09 (×2): 4 mg via INTRAVENOUS
  Filled 2022-08-09 (×2): qty 2

## 2022-08-09 MED ORDER — PROMETHAZINE HCL 25 MG/ML IJ SOLN
12.5000 mg | Freq: Once | INTRAMUSCULAR | Status: AC
  Administered 2022-08-09: 12.5 mg via INTRAVENOUS
  Filled 2022-08-09: qty 0.5

## 2022-08-09 MED ORDER — GADOBUTROL 1 MMOL/ML IV SOLN
6.5000 mL | Freq: Once | INTRAVENOUS | Status: AC | PRN
Start: 2022-08-09 — End: 2022-08-09
  Administered 2022-08-09: 6.5 mL via INTRAVENOUS

## 2022-08-09 NOTE — Progress Notes (Addendum)
PROGRESS NOTE    [name]  NWG:[id number] DOB: January 28, 1986 DOA: 08/08/2022 PCP: Pcp, No    Brief Narrative:  [name] is a 36 y.o. female with medical history significant of distant history of seizure, alcohol use, anemia presented to the hospital with witnessed seizure-like activity at home.  Patient had recently been on a cruise a week back and had fever sore throat nausea vomiting chills and fatigue for travel.  At home patient had total of 4 episodes of shaking  lasting 20 seconds each all within around 5 minutes.  Episodes were described as tonic-clonic with generalized shaking and stiffening in nature.  Patient was confused after the episode.  Patient had heavier alcohol use on her cruise but not very much since. Typically she drinks 2-3 drinks 3-4 times a week.  Patient has a histor of seizure in around 2013.  No seizure since that time not on any antiepileptics.   In the ED, vitals were significant for temperature of 100.1 F.  Labs showed sodium of 130, potassium 2.9, chloride 89, AST 63, ALT 69.  CBC with hemoglobin stable at 10.8.  flu and COVID-negative.  Ethanol level negative.  Lumbar puncture was performed in the ED patient was empirically started on vancomycin, ceftriaxone, acyclovir, Decadron, IV fluids.   Neurology was consulted and recommended lumbar puncture, easy, MRI and hold off on AED.  Patient was then considered for admission to the hospital.    Assessment and Plan: Principal Problem:   Seizure [facility]) Active Problems:   Hypokalemia   Alcohol dependence (HCC)   Anemia   Hyponatremia   Transaminitis   Prolonged QT interval   UTI (urinary tract infection)   Trichomoniasis   Seizure/febrile episode. Seizure episode with recent febrile episode.  Blood cultures negative in less than 12 hours.  HIV negative.  Lumbar puncture was performed in the ED and meningitis/encephalitis panel is negative.  Autoimmune colitis panel, Lyme disease DNA pending.  Urine  drug screen was positive for THC.  Unlikely to be meningitis. Patient had received vancomycin, ceftriaxone, acyclovir as well as Decadron in the ED. We will discontinue vancomycin and acyclovir.  Check MRI brain, video EEG.  Temperature max of 100.1 F.  Latest temperature of 98.7 F.  Urinalysis shows 11-20 white cells.  Trichomonas is present.  We will add on Rocephin and metronidazole.  Chest x-ray without any infiltrate.  Follow neurology recommendations on seizure.  Nausea, vomiting upper abdominal pain.  We will get right upper quadrant ultrasound.  Add lipase.  Currently on clears.  History of Alcohol use during vacation.  Possible UTI/trichomoniasis.  We will get gonococcal/chlamydia probe.  Send urine for culture and sensitivity.  Continue Rocephin   Hyponatremia Sodium level of 130 on presentation.  Improved with fluids.  Latest sodium of 135.  Hypokalemia Potassium level of 2.9 on presentation.  Replenished.  Potassium at 3.2 today.  We will continue to replenish with IV potassium 40 mEq.  Check levels in AM.    Elevated LFT. Initial AST of 63 and ALT of 69.  Has had some intermittent transaminitis in the past per chart review.  Improved at this time.  Prolonged QT > QTc 580 in ED, avoiding antiemetics and other QT prolonging meds if able.  We will give 1 dose of Phenergan today due to improved potassium level.   Anemia Mild.  Hemoglobin of 10.8.  Continue to monitor during hospitalization.  History of alcohol use  On CIWA without Ativan at this time.  Need to monitor.   DVT prophylaxis: enoxaparin (LOVENOX) injection 40 mg Start: 08/08/22 2200   Code Status:     Code Status: Full Code  Disposition: Home  Status is: Observation  The patient will require care spanning > 2 midnights and should be moved to inpatient because: Febrile episode, seizure work-up, poor oral intolerance, IV fluids, IV antibiotics   Family Communication: Spoke with the patient's mother at bedside.    Consultants:  Neurology  Procedures:  Lumbar puncture  Antimicrobials:  Rocephin IV Metronidazole  Anti-infectives (From admission, onward)    Start     Dose/Rate Route Frequency Ordered Stop   08/10/22 0300  cefTRIAXone (ROCEPHIN) 2 g in sodium chloride 0.9 % 100 mL IVPB  Status:  Discontinued        2 g 200 mL/hr over 30 Minutes Intravenous Every 12 hours 08/08/22 1827 08/09/22 0851   08/09/22 1445  metroNIDAZOLE (FLAGYL) IVPB 500 mg        500 mg 100 mL/hr over 60 Minutes Intravenous Every 12 hours 08/09/22 1349 08/16/22 1444   08/09/22 1445  cefTRIAXone (ROCEPHIN) 1 g in sodium chloride 0.9 % 100 mL IVPB        1 g 200 mL/hr over 30 Minutes Intravenous Every 24 hours 08/09/22 1349 08/12/22 1444   08/09/22 0015  vancomycin (VANCOREADY) IVPB 750 mg/150 mL  Status:  Discontinued        750 mg 150 mL/hr over 60 Minutes Intravenous Every 8 hours 08/08/22 2322 08/09/22 0851   08/08/22 2315  vancomycin (VANCOCIN) IVPB 750 mg/150 ml premix  Status:  Discontinued       See Hyperspace for full Linked Orders Report.   750 mg 150 mL/hr over 60 Minutes Intravenous Every 8 hours 08/08/22 1504 08/08/22 2322   08/08/22 1515  vancomycin (VANCOREADY) IVPB 1500 mg/300 mL       See Hyperspace for full Linked Orders Report.   1,500 mg 150 mL/hr over 120 Minutes Intravenous  Once 08/08/22 1504 08/08/22 2043   08/08/22 1500  Vancomycin (VANCOCIN) 1,500 mg in sodium chloride 0.9 % 500 mL IVPB  Status:  Discontinued        1,500 mg 250 mL/hr over 120 Minutes Intravenous  Once 08/08/22 1455 08/08/22 1504   08/08/22 1500  acyclovir (ZOVIRAX) 635 mg in dextrose 5 % 100 mL IVPB  Status:  Discontinued        10 mg/kg  63.5 kg 112.7 mL/hr over 60 Minutes Intravenous Every 8 hours 08/08/22 1459 08/09/22 0851   08/08/22 1430  cefTRIAXone (ROCEPHIN) 2 g in sodium chloride 0.9 % 100 mL IVPB        2 g 200 mL/hr over 30 Minutes Intravenous  Once 08/08/22 1425 08/08/22 1726   08/08/22 1430  vancomycin  (VANCOCIN) IVPB 1000 mg/200 mL premix  Status:  Discontinued        1,000 mg 200 mL/hr over 60 Minutes Intravenous  Once 08/08/22 1425 08/08/22 1455   08/08/22 1430  acyclovir (ZOVIRAX) 635 mg in dextrose 5 % 100 mL IVPB  Status:  Discontinued        10 mg/kg  63.5 kg 112.7 mL/hr over 60 Minutes Intravenous  Once 08/08/22 1425 08/08/22 1456       Subjective: Today, patient was seen and examined at bedside.  Patient complains of headache nausea vomiting abdominal pain.  Denies any joint pain.  Denies any urinary urgency or frequency has mild cough and feels like because of cough and vomiting has  upper abdominal pain.  Has not had a bowel movement.  Objective: Vitals:   08/08/22 2319 08/09/22 0345 08/09/22 0937 08/09/22 1134  BP: 126/78 106/67 119/62 130/77  Pulse: 62 (!) 54 61 (!) 55  Resp: 18 20 17 19   Temp: 99.3 F (37.4 C) 98.7 F (37.1 C) 98.9 F (37.2 C) (!) 100.5 F (38.1 C)  TempSrc: Oral Oral Oral Oral  SpO2: 100% 98% 99% 100%  Weight:      Height:        Intake/Output Summary (Last 24 hours) at 08/09/2022 1407 Last data filed at 08/09/2022 0955 Gross per 24 hour  Intake 864.41 ml  Output 300 ml  Net 564.41 ml   Filed Weights   08/08/22 1325 08/08/22 2209  Weight: 63.5 kg 64.2 kg    Physical Examination: Body mass index is 25.89 kg/m.  General:  Average built, not in obvious distress HENT:   No scleral pallor or icterus noted. Oral mucosa is moist.  Chest:  Clear breath sounds.  Diminished breath sounds bilaterally. No crackles or wheezes.  CVS: S1 &S2 heard. No murmur.  Regular rate and rhythm. Abdomen: Soft, nonspecific tenderness noted over the epigastric region, nondistended.  Bowel sounds are heard.   Extremities: No cyanosis, clubbing or edema.  Peripheral pulses are palpable. Psych: Alert, awake and oriented, normal mood CNS:  No cranial nerve deficits.  Power equal in all extremities.   Skin: Warm and dry.  No rashes noted.  Data Reviewed:    CBC: Recent Labs  Lab 08/08/22 1334 08/09/22 0709  WBC 7.6 8.1  NEUTROABS 5.4  --   HGB 10.8* 9.0*  HCT 32.3* 27.5*  MCV 78.8* 80.9  PLT 277 275    Basic Metabolic Panel: Recent Labs  Lab 08/08/22 1334 08/09/22 0709  NA 130* 135  K 2.9* 3.2*  CL 89* 100  CO2 25 22  GLUCOSE 97 75  BUN 7 7  CREATININE 0.81 0.71  CALCIUM 9.2 8.3*  MG 1.8  --   PHOS 2.6  --     Liver Function Tests: Recent Labs  Lab 08/08/22 1334 08/09/22 0709  AST 63* 36  ALT 69* 44  ALKPHOS 78 63  BILITOT 0.6 0.5  PROT 7.7 6.3*  ALBUMIN 4.5 3.4*     Radiology Studies: DG CHEST PORT 1 VIEW  Result Date: 08/09/2022 CLINICAL DATA:  Seizures. EXAM: PORTABLE CHEST 1 VIEW COMPARISON:  July 06, 2020 FINDINGS: EKG leads and EG leads project over the chest. Cardiomediastinal contours and hilar structures are normal. Lungs are clear. On limited assessment no acute skeletal findings. IMPRESSION: No acute cardiopulmonary disease. Electronically Signed   By: July 08, 2020 M.D.   On: 08/09/2022 09:26   Overnight EEG with video  Result Date: 08/09/2022 08/11/2022, MD     08/09/2022 10:10 AM Patient Name: ZYKERRIA TANTON MRN: Lucina Mellow Epilepsy Attending: [id number] Referring Physician/Provider: Charlsie Quest, MD Duration: 08/08/2022 1901 to 08/09/2022 1000 Patient history: 36 y.o. female with medical history significant of distant history of seizure, alcohol use, anemia presenting after seizures at home.  On EEG to evaluate for seizure. Level of alertness: Awake, asleep AEDs during EEG study: None Technical aspects: This EEG study was done with scalp electrodes positioned according to the 10-20 International system of electrode placement. Electrical activity was reviewed with band pass filter of 1-70Hz , sensitivity of 7 uV/mm, display speed of 39mm/sec with a 60Hz  notched filter applied as appropriate. EEG data were recorded continuously and digitally  stored.  Video monitoring was available and  reviewed as appropriate. Description: The posterior dominant rhythm consists of 9-10 Hz activity of moderate voltage (25-35 uV) seen predominantly in posterior head regions, symmetric and reactive to eye opening and eye closing. Sleep was characterized by vertex waves, sleep spindles (12 to 14 Hz), maximal frontocentral region. Hyperventilation and photic stimulation were not performed.   IMPRESSION: This study is within normal limits. No seizures or epileptiform discharges were seen throughout the recording. A normal interictal EEG does not exclude nor support the diagnosis of epilepsy. Charlsie Quest   CT Head Wo Contrast  Result Date: 08/08/2022 CLINICAL DATA:  Seizure. EXAM: CT HEAD WITHOUT CONTRAST TECHNIQUE: Contiguous axial images were obtained from the base of the skull through the vertex without intravenous contrast. RADIATION DOSE REDUCTION: This exam was performed according to the departmental dose-optimization program which includes automated exposure control, adjustment of the mA and/or kV according to patient size and/or use of iterative reconstruction technique. COMPARISON:  January 05, 2012 FINDINGS: Brain: No evidence of acute infarction, hemorrhage, hydrocephalus, extra-axial collection or mass lesion/mass effect. Vascular: No hyperdense vessel or unexpected calcification. Skull: Normal. Negative for fracture or focal lesion. Sinuses/Orbits: No acute finding. Other: None. IMPRESSION: Normal study.  No cause for symptoms identified. Electronically Signed   By: Gerome Sam III M.D.   On: 08/08/2022 15:48      LOS: 0 days    Joycelyn Das, MD [facility] Available via Epic secure chat 7am-7pm After these hours, please refer to coverage provider listed on [email or web address] 08/09/2022, 2:07 PM

## 2022-08-09 NOTE — Progress Notes (Signed)
Subjective: NAEO. Mother at bedside  ROS: negative except above  Examination  Vital signs in last 24 hours: Temp:  [98.7 F (37.1 C)-100.1 F (37.8 C)] 98.9 F (37.2 C) (08/14 0937) Pulse Rate:  [40-86] 61 (08/14 0937) Resp:  [17-29] 17 (08/14 0937) BP: (101-130)/(51-89) 119/62 (08/14 0937) SpO2:  [95 %-100 %] 99 % (08/14 0937) Weight:  [63.5 kg-64.2 kg] 64.2 kg (08/13 2209)  General: lying in bed, NAD CVS: pulse-normal rate and rhythm RS: breathing comfortably Extremities: normal   Neuro: MS: Alert, oriented, follows commands CN: pupils equal and reactive,  EOMI, face symmetric, tongue midline, normal sensation over face, Motor: 5/5 strength in all 4 extremities Reflexes: 2+ bilaterally over patella, biceps, plantars: flexor Coordination: normal Gait: not tested  Basic Metabolic Panel: Recent Labs  Lab 08/08/22 1334 08/09/22 0709  NA 130* 135  K 2.9* 3.2*  CL 89* 100  CO2 25 22  GLUCOSE 97 75  BUN 7 7  CREATININE 0.81 0.71  CALCIUM 9.2 8.3*  MG 1.8  --   PHOS 2.6  --     CBC: Recent Labs  Lab 08/08/22 1334 08/09/22 0709  WBC 7.6 8.1  NEUTROABS 5.4  --   HGB 10.8* 9.0*  HCT 32.3* 27.5*  MCV 78.8* 80.9  PLT 277 275    Coagulation Studies: No results for input(s): "LABPROT", "INR" in the last 72 hours.  Imaging  CT head wo contrast 08/08/2022: Normal study.  No cause for symptoms identified.  ASSESSMENT AND PLAN:  36 y.o. female with a past medical history of a single seizure at the age of 7, alcohol use, and marijuana use who presents emergency department with nausea, vomiting, and multiple seizure-like spells.    Seizure-like episodes -No evidence of ictal-interictal activity overnight  Recommendations: -We will continue LTM EEG overnight. -We will plan for HV and photic stimulation tomorrow -We will avoid starting antiepileptics unless any ictal-interictal activity on EEG -Dispo with patient mother at bedside that if EEG is completely normal  for 72 hours then its unlikely that patient has epilepsy.  Also discussed the diagnosis of possible nonepileptic events -Continue seizure precautions -As needed IV Ativan 2 mg for clinical seizure-like activity  I have spent a total of  38  minutes with the patient reviewing hospital notes,  test results, labs and examining the patient as well as establishing an assessment and plan that was discussed personally with the patient.  > 50% of time was spent in direct patient care.       Lindie Spruce Epilepsy [facility] For questions after 5pm please refer to AMION to reach the Neurologist on call

## 2022-08-09 NOTE — Progress Notes (Signed)
Performed maintenance, all under 10.  No visible skin breakdown

## 2022-08-09 NOTE — Procedures (Signed)
Patient Name: SHAKEERA RIGHTMYER  MRN: [id number]  Epilepsy Attending: Charlsie Quest  Referring Physician/Provider: Caryl Pina, MD  Duration: 08/08/2022 1901 to 08/09/2022 1901  Patient history: 36 y.o. female with medical history significant of distant history of seizure, alcohol use, anemia presenting after seizures at home.  On EEG to evaluate for seizure.  Level of alertness: Awake, asleep  AEDs during EEG study: None  Technical aspects: This EEG study was done with scalp electrodes positioned according to the 10-20 International system of electrode placement. Electrical activity was reviewed with band pass filter of 1-70Hz , sensitivity of 7 uV/mm, display speed of 12mm/sec with a 60Hz  notched filter applied as appropriate. EEG data were recorded continuously and digitally stored.  Video monitoring was available and reviewed as appropriate.  Description: The posterior dominant rhythm consists of 9-10 Hz activity of moderate voltage (25-35 uV) seen predominantly in posterior head regions, symmetric and reactive to eye opening and eye closing. Sleep was characterized by vertex waves, sleep spindles (12 to 14 Hz), maximal frontocentral region. Hyperventilation and photic stimulation were not performed.     IMPRESSION: This study is within normal limits. No seizures or epileptiform discharges were seen throughout the recording.  A normal interictal EEG does not exclude nor support the diagnosis of epilepsy.   Damare Serano 

## 2022-08-09 NOTE — Progress Notes (Signed)
  Transition of Care [facility]) Screening Note   Patient Details  Name: [name] Date of Birth: 1986-07-21   Transition of Care [facility]) CM/SW Contact:    Kermit Balo, RN Phone Number: 08/09/2022, 2:47 PM   Pt is from home with parent.  Transition of Care Department [facility]) has reviewed patient. We will continue to monitor patient advancement through interdisciplinary progression rounds. If new patient transition needs arise, please place a TOC consult.

## 2022-08-09 NOTE — Progress Notes (Signed)
TRH night cross cover note:   I was notified by RN regarding patient's complaint of nausea.  I reviewed most recent EKG, noting QTc of 457 ms.  I subsequently placed order for prn IV Zofran.     Newton Pigg, DO Hospitalist

## 2022-08-10 DIAGNOSIS — R569 Unspecified convulsions: Secondary | ICD-10-CM | POA: Diagnosis not present

## 2022-08-10 DIAGNOSIS — E871 Hypo-osmolality and hyponatremia: Secondary | ICD-10-CM | POA: Diagnosis not present

## 2022-08-10 DIAGNOSIS — D649 Anemia, unspecified: Secondary | ICD-10-CM | POA: Diagnosis not present

## 2022-08-10 DIAGNOSIS — E876 Hypokalemia: Secondary | ICD-10-CM | POA: Diagnosis not present

## 2022-08-10 LAB — CBC
HCT: 29 % — ABNORMAL LOW (ref 36.0–46.0)
Hemoglobin: 9.1 g/dL — ABNORMAL LOW (ref 12.0–15.0)
MCH: 26.1 pg (ref 26.0–34.0)
MCHC: 31.4 g/dL (ref 30.0–36.0)
MCV: 83.1 fL (ref 80.0–100.0)
Platelets: 251 10*3/uL (ref 150–400)
RBC: 3.49 MIL/uL — ABNORMAL LOW (ref 3.87–5.11)
RDW: 21.4 % — ABNORMAL HIGH (ref 11.5–15.5)
WBC: 7 10*3/uL (ref 4.0–10.5)
nRBC: 0 % (ref 0.0–0.2)

## 2022-08-10 LAB — COMPREHENSIVE METABOLIC PANEL
ALT: 39 U/L (ref 0–44)
AST: 31 U/L (ref 15–41)
Albumin: 3.2 g/dL — ABNORMAL LOW (ref 3.5–5.0)
Alkaline Phosphatase: 54 U/L (ref 38–126)
Anion gap: 8 (ref 5–15)
BUN: <5 mg/dL — ABNORMAL LOW (ref 6–20)
CO2: 24 mmol/L (ref 22–32)
Calcium: 8.4 mg/dL — ABNORMAL LOW (ref 8.9–10.3)
Chloride: 104 mmol/L (ref 98–111)
Creatinine, Ser: 0.64 mg/dL (ref 0.44–1.00)
GFR, Estimated: >60 mL/min (ref >60)
Glucose, Bld: 88 mg/dL (ref 70–99)
Potassium: 3 mmol/L — ABNORMAL LOW (ref 3.5–5.1)
Sodium: 136 mmol/L (ref 135–145)
Total Bilirubin: 0.6 mg/dL (ref 0.3–1.2)
Total Protein: 5.8 g/dL — ABNORMAL LOW (ref 6.5–8.1)

## 2022-08-10 LAB — MAGNESIUM: Magnesium: 1.9 mg/dL (ref 1.7–2.4)

## 2022-08-10 MED ORDER — POTASSIUM CHLORIDE 10 MEQ/100ML IV SOLN
10.0000 meq | INTRAVENOUS | Status: DC
  Administered 2022-08-10: 10 meq via INTRAVENOUS
  Filled 2022-08-10: qty 100

## 2022-08-10 MED ORDER — POTASSIUM CHLORIDE 20 MEQ PO PACK
40.0000 meq | PACK | Freq: Two times a day (BID) | ORAL | Status: DC
  Administered 2022-08-10 – 2022-08-11 (×3): 40 meq via ORAL
  Filled 2022-08-10 (×3): qty 2

## 2022-08-10 MED ORDER — PNEUMOCOCCAL 20-VAL CONJ VACC 0.5 ML IM SUSY
0.5000 mL | PREFILLED_SYRINGE | INTRAMUSCULAR | Status: AC
  Administered 2022-08-10: 0.5 mL via INTRAMUSCULAR

## 2022-08-10 NOTE — Procedures (Signed)
Patient Name: ROBERT SUNGA  MRN: [id number]  Epilepsy Attending: Charlsie Quest  Referring Physician/Provider: Caryl Pina, MD  Duration: 08/09/2022 1901 to 08/10/2022 1901   Patient history: 36 y.o. female with medical history significant of distant history of seizure, alcohol use, anemia presenting after seizures at home.  On EEG to evaluate for seizure.   Level of alertness: Awake, asleep   AEDs during EEG study: None   Technical aspects: This EEG study was done with scalp electrodes positioned according to the 10-20 International system of electrode placement. Electrical activity was reviewed with band pass filter of 1-70Hz , sensitivity of 7 uV/mm, display speed of 52mm/sec with a 60Hz  notched filter applied as appropriate. EEG data were recorded continuously and digitally stored.  Video monitoring was available and reviewed as appropriate.   Description: The posterior dominant rhythm consists of 9-10 Hz activity of moderate voltage (25-35 uV) seen predominantly in posterior head regions, symmetric and reactive to eye opening and eye closing. Sleep was characterized by vertex waves, sleep spindles (12 to 14 Hz), maximal frontocentral region. Hyperventilation and photic stimulation were not performed.      IMPRESSION: This study is within normal limits. No seizures or epileptiform discharges were seen throughout the recording.   A normal interictal EEG does not exclude nor support the diagnosis of epilepsy.     Zidan Helget 

## 2022-08-10 NOTE — Progress Notes (Signed)
PROGRESS NOTE    [name]  [id number] DOB: 01/03/1986 DOA: 08/08/2022 PCP: Pcp, No    Brief Narrative:  [name] is a 36 y.o. female with medical history significant of distant history of seizure, alcohol use, anemia presented to the hospital with witnessed seizure-like activity at home.  Patient had recently been on a cruise a week back and had fever sore throat, nausea,vomiting chills and fatigue after travel.  At home patient had total of 4 episodes of shaking  lasting 20 seconds each all within around 5 minutes.  Episodes were described as tonic-clonic with generalized shaking and stiffening in nature.  Patient was confused after the episode.  Patient had heavier alcohol use on her cruise but not very much since. Typically she drinks 2-3 drinks 3-4 times a week.  Patient has a history of seizure in around 2013.  No seizure since that time, not on any antiepileptics.  In the ED, vitals were significant for temperature of 100.1 F.  Labs showed sodium of 130, potassium 2.9, chloride 89, AST 63, ALT 69.  CBC with hemoglobin stable at 10.8.  Flu and COVID-negative.  Ethanol level negative.  Lumbar puncture was performed in the ED and patient was empirically started on vancomycin, ceftriaxone, acyclovir, Decadron, IV fluids.   Neurology was consulted and recommended lumbar puncture, MRI and hold off on AED.  Patient was then considered for admission to the hospital.  At this time, neurology is following and is on long-term EEG.  Symptomatically patient is improving.  Fever work-up are negative so far except for trichomoniasis.  On Rocephin and Flagyl empirically.    Assessment and Plan: Principal Problem:   Seizure ([place]) Active Problems:   Hypokalemia   Alcohol dependence (HCC)   Anemia   Hyponatremia   Transaminitis   Prolonged QT interval   UTI (urinary tract infection)   Trichomoniasis   Seizure/febrile episode. Blood cultures negative in 2 days.  HIV negative.   Lumbar puncture was performed in the ED and meningitis/encephalitis panel is negative.  Autoimmune colitis panel, Lyme disease DNA pending.  Urine drug screen was positive for THC.  Fever has improved at this time.  Temperature max of 98.8 F in the last 24 hours. Urinalysis shows 11-20 white cells RBC but the patient stated that she was having menstruation.  Trichomonas is present.  Currently on Rocephin and metronidazole.  Chest x-ray without any infiltrate.  Undergoing long-term EEG at this time.  Follow neurology recommendation.  Nausea, vomiting upper abdominal pain.  Right upper quadrant ultrasound showed fatty liver.  Lipase was negative..  Currently on clears.  Advance diet to soft diet.  Patient feels better with nausea today.  Possible UTI/trichomoniasis.  Pending gonococcal/chlamydia probe.  Follow urine for culture and sensitivity.  On IV Rocephin.  Patient had hematuria but likely from menstruation.   Hyponatremia Sodium level of 130 on presentation.  Improved with fluids.  Latest sodium of 136  Hypokalemia Potassium level of 2.9 on presentation.  Replenished aggressively but potassium still low at 3.0.  Patient had difficulty running IV potassium so we will change to potassium packet orally today.  Check BMP in AM.  Elevated LFT. Initial AST of 63 and ALT of 69.  AST ALT has improved at this time.   Prolonged QT Continue to replenish potassium.   Anemia Mild.  Hemoglobin of 9.1 today.  Received IV fluids mild complaint of dilution.  Continue to monitor during hospitalization.  History of alcohol use  On  CIWA without Ativan at this time.  Need to monitor.   DVT prophylaxis: enoxaparin (LOVENOX) injection 40 mg Start: 08/08/22 2200   Code Status:     Code Status: Full Code  Disposition: Home  Status is: Inpatient  The patient is inpatient because: Febrile episode, seizure work-up on long-term EEG, poor oral intolerance, IV fluids, IV antibiotics   Family  Communication: I again spoke with the patient's mother at bedside.   Consultants:  Neurology  Procedures:  Lumbar puncture  Antimicrobials:  Rocephin IV Metronidazole   Subjective: Today, patient was seen and examined at bedside.  Feels better with the nausea and overall energy.  Temperature has improved over the last 24 hours.  Needed a suppository and had a small bowel movement yesterday.  Wishes to try solid food.    Objective: Vitals:   08/09/22 2121 08/10/22 0346 08/10/22 0742 08/10/22 1100  BP: (!) 108/55 (!) 105/47 (!) 99/58 110/85  Pulse: 68 (!) 47 60 85  Resp: 20 16 16 16   Temp: 98.8 F (37.1 C) 98.7 F (37.1 C) 98.3 F (36.8 C) 97.9 F (36.6 C)  TempSrc: Oral Oral Oral Oral  SpO2: 100% 100% 100% 100%  Weight:      Height:        Intake/Output Summary (Last 24 hours) at 08/10/2022 1314 Last data filed at 08/10/2022 0636 Gross per 24 hour  Intake 300 ml  Output --  Net 300 ml    Filed Weights   08/08/22 1325 08/08/22 2209  Weight: 63.5 kg 64.2 kg    Physical Examination: Body mass index is 25.89 kg/m.   General:  Average built, not in obvious distress HENT:   No scleral pallor or icterus noted. Oral mucosa is moist.  Chest:  Clear breath sounds.  Diminished breath sounds bilaterally. No crackles or wheezes.  CVS: S1 &S2 heard. No murmur.  Regular rate and rhythm. Abdomen: Soft, nontender, nondistended.  Bowel sounds are heard.   Extremities: No cyanosis, clubbing or edema.  Peripheral pulses are palpable. Psych: Alert, awake and oriented, normal mood CNS:  No cranial nerve deficits.  Power equal in all extremities.   Skin: Warm and dry.  No rashes noted.   Data Reviewed:   CBC: Recent Labs  Lab 08/08/22 1334 08/09/22 0709 08/10/22 0418  WBC 7.6 8.1 7.0  NEUTROABS 5.4  --   --   HGB 10.8* 9.0* 9.1*  HCT 32.3* 27.5* 29.0*  MCV 78.8* 80.9 83.1  PLT 277 275 251     Basic Metabolic Panel: Recent Labs  Lab 08/08/22 1334 08/09/22 0709  08/10/22 0418  NA 130* 135 136  K 2.9* 3.2* 3.0*  CL 89* 100 104  CO2 25 22 24   GLUCOSE 97 75 88  BUN 7 7 <5*  CREATININE 0.81 0.71 0.64  CALCIUM 9.2 8.3* 8.4*  MG 1.8  --  1.9  PHOS 2.6  --   --      Liver Function Tests: Recent Labs  Lab 08/08/22 1334 08/09/22 0709 08/10/22 0418  AST 63* 36 31  ALT 69* 44 39  ALKPHOS 78 63 54  BILITOT 0.6 0.5 0.6  PROT 7.7 6.3* 5.8*  ALBUMIN 4.5 3.4* 3.2*      Radiology Studies: MR BRAIN W WO CONTRAST  Result Date: 08/09/2022 CLINICAL DATA:  Seizure, no history of trauma EXAM: MRI HEAD WITHOUT AND WITH CONTRAST TECHNIQUE: Multiplanar, multiecho pulse sequences of the brain and surrounding structures were obtained without and with intravenous contrast. CONTRAST:  6.46mL GADAVIST GADOBUTROL 1 MMOL/ML IV SOLN COMPARISON:  No prior MRI, correlation is made with CT head 08/08/2022 FINDINGS: Brain: No restricted diffusion to suggest acute or subacute infarct. No acute hemorrhage, mass, mass effect, or midline shift. No hemosiderin deposition to suggest remote hemorrhage. No abnormal parenchymal or meningeal enhancement. No abnormal T2 hyperintense signal in the periventricular or juxtacortical white matter. No hydrocephalus or extra-axial collection. Normal pituitary and craniocervical junction. The hippocampi are symmetric in size and signal. No heterotopia or evidence of cortical dysgenesis. Vascular: Normal arterial flow voids. Normal arterial and venous enhancement. Skull and upper cervical spine: Normal marrow signal. Sinuses/Orbits: No acute finding. Other: Trace fluid in the mastoid air cells. IMPRESSION: No acute intracranial process.  No seizure etiology identified. Electronically Signed   By: Merilyn Baba M.D.   On: 08/09/2022 19:16   US ABDOMEN LIMITED RUQ (LIVER/GB)  Result Date: 08/09/2022 CLINICAL DATA:  Abdominal pain. EXAM: ULTRASOUND ABDOMEN LIMITED RIGHT UPPER QUADRANT COMPARISON:  Abdominal ultrasound 04/17/2013. CT renal stone  05/22/2021. FINDINGS: Gallbladder: No gallstones or wall thickening visualized. No sonographic Murphy sign noted by sonographer. Common bile duct: Diameter: 3 mm. Liver: No focal lesion identified. Increase in parenchymal echogenicity. Portal vein is patent on color Doppler imaging with normal direction of blood flow towards the liver. Other: None. IMPRESSION: 1. Echogenic liver likely related to diffuse fatty infiltration. 2. No cholelithiasis. Electronically Signed   By: Ronney Asters M.D.   On: 08/09/2022 15:07   DG CHEST PORT 1 VIEW  Result Date: 08/09/2022 CLINICAL DATA:  Seizures. EXAM: PORTABLE CHEST 1 VIEW COMPARISON:  July 06, 2020 FINDINGS: EKG leads and EG leads project over the chest. Cardiomediastinal contours and hilar structures are normal. Lungs are clear. On limited assessment no acute skeletal findings. IMPRESSION: No acute cardiopulmonary disease. Electronically Signed   By: Zetta Bills M.D.   On: 08/09/2022 09:26   Overnight EEG with video  Result Date: 08/09/2022 Lora Havens, MD     08/10/2022 10:08 AM Patient Name: [name] MRN: [id number] Epilepsy Attending: Lora Havens Referring Physician/Provider: Kerney Elbe, MD Duration: 08/08/2022 1901 to 08/09/2022 1901 Patient history: 36 y.o. female with medical history significant of distant history of seizure, alcohol use, anemia presenting after seizures at home.  On EEG to evaluate for seizure. Level of alertness: Awake, asleep AEDs during EEG study: None Technical aspects: This EEG study was done with scalp electrodes positioned according to the 10-20 International system of electrode placement. Electrical activity was reviewed with band pass filter of 1-70Hz , sensitivity of 7 uV/mm, display speed of 66mm/sec with a 60Hz  notched filter applied as appropriate. EEG data were recorded continuously and digitally stored.  Video monitoring was available and reviewed as appropriate. Description: The posterior dominant rhythm  consists of 9-10 Hz activity of moderate voltage (25-35 uV) seen predominantly in posterior head regions, symmetric and reactive to eye opening and eye closing. Sleep was characterized by vertex waves, sleep spindles (12 to 14 Hz), maximal frontocentral region. Hyperventilation and photic stimulation were not performed.   IMPRESSION: This study is within normal limits. No seizures or epileptiform discharges were seen throughout the recording. A normal interictal EEG does not exclude nor support the diagnosis of epilepsy. Lora Havens   CT Head Wo Contrast  Result Date: 08/08/2022 CLINICAL DATA:  Seizure. EXAM: CT HEAD WITHOUT CONTRAST TECHNIQUE: Contiguous axial images were obtained from the base of the skull through the vertex without intravenous contrast. RADIATION DOSE REDUCTION: This exam was  performed according to the departmental dose-optimization program which includes automated exposure control, adjustment of the mA and/or kV according to patient size and/or use of iterative reconstruction technique. COMPARISON:  January 05, 2012 FINDINGS: Brain: No evidence of acute infarction, hemorrhage, hydrocephalus, extra-axial collection or mass lesion/mass effect. Vascular: No hyperdense vessel or unexpected calcification. Skull: Normal. Negative for fracture or focal lesion. Sinuses/Orbits: No acute finding. Other: None. IMPRESSION: Normal study.  No cause for symptoms identified. Electronically Signed   By: Gerome Sam III M.D.   On: 08/08/2022 15:48      LOS: 1 day    Joycelyn Das, MD [facility] Available via Epic secure chat 7am-7pm After these hours, please refer to coverage provider listed on [email or web address] 08/10/2022, 1:14 PM

## 2022-08-10 NOTE — TOC Initial Note (Signed)
Transition of Care [facility]) - Initial/Assessment Note    Patient Details  Name: GAIGE FUSSNER MRN: [id number] Date of Birth: 08/11/1986  Transition of Care [facility]) CM/SW Contact:    Kermit Balo, RN Phone Number: 08/10/2022, 12:19 PM  Clinical Narrative:                 Pt is from home with her mother. She states she has supervision most of the time.  No DME.  Pt states currently no transportation issues but may in the future.  No PCP. She doesn't want CM to assist in finding a PCP.  TOC following.  Expected Discharge Plan: Home/Self Care Barriers to Discharge: Continued Medical Work up   Patient Goals and CMS Choice        Expected Discharge Plan and Services Expected Discharge Plan: Home/Self Care   Discharge Planning Services: CM Consult   Living arrangements for the past 2 months: Apartment                                      Prior Living Arrangements/Services Living arrangements for the past 2 months: Apartment Lives with:: Parents Patient language and need for interpreter reviewed:: Yes Do you feel safe going back to the place where you live?: Yes            Criminal Activity/Legal Involvement Pertinent to Current Situation/Hospitalization: No - Comment as needed  Activities of Daily Living Home Assistive Devices/Equipment: None ADL Screening (condition at time of admission) Patient's cognitive ability adequate to safely complete daily activities?: Yes Is the patient deaf or have difficulty hearing?: No Does the patient have difficulty seeing, even when wearing glasses/contacts?: No Does the patient have difficulty concentrating, remembering, or making decisions?: No Patient able to express need for assistance with ADLs?: Yes Does the patient have difficulty dressing or bathing?: No Independently performs ADLs?: Yes (appropriate for developmental age) Does the patient have difficulty walking or climbing stairs?: No Weakness of Legs:  None Weakness of Arms/Hands: None  Permission Sought/Granted                  Emotional Assessment Appearance:: Appears younger than stated age Attitude/Demeanor/Rapport: Engaged Affect (typically observed): Accepting Orientation: : Oriented to Self, Oriented to Place, Oriented to  Time, Oriented to Situation   Psych Involvement: No (comment)  Admission diagnosis:  Seizure [facility]) [R56.9] Patient Active Problem List   Diagnosis Date Noted   UTI (urinary tract infection) 08/09/2022   Trichomoniasis 08/09/2022   Seizure (HCC) 08/08/2022   Hyponatremia 08/08/2022   Transaminitis 08/08/2022   Prolonged QT interval 08/08/2022   Anemia 04/19/2013   Hypokalemia 04/04/2013   Tobacco abuse 04/04/2013   Alcohol dependence (HCC) 04/04/2013   PCP:  Pcp, No Pharmacy:   [id number] Judithann Sheen, [place] - [address] [place] [address] Phone: [phone] Fax: [phone]     Social Determinants of Health (SDOH) Interventions    Readmission Risk Interventions     No data to display

## 2022-08-10 NOTE — Progress Notes (Signed)
LTM maint complete - no skin breakdown under: Fp2  A1  Serviced O1 . Changed out Breakout cable and box.

## 2022-08-10 NOTE — Progress Notes (Signed)
vLTM maintenance  All impedances below 10kohms.  No skin breakdown noted at all skin sites.   Study online and on server.  [facility] unable to view.

## 2022-08-10 NOTE — Progress Notes (Signed)
Attempted to get urine sample for testing, but urine was mixed with liquid stool. Clean hat place in toilet and will attempt again.

## 2022-08-10 NOTE — Progress Notes (Signed)
Subjective: No seizure-like episodes overnight.  Denies any new concerns.  ROS: negative except above  Examination  Vital signs in last 24 hours: Temp:  [98.3 F (36.8 C)-100.5 F (38.1 C)] 98.3 F (36.8 C) (08/15 0742) Pulse Rate:  [47-68] 60 (08/15 0742) Resp:  [16-20] 16 (08/15 0742) BP: (99-130)/(47-77) 99/58 (08/15 0742) SpO2:  [100 %] 100 % (08/15 0742)  General: lying in bed, NAD CVS: pulse-normal rate and rhythm RS: breathing comfortably Extremities: normal    Neuro: MS: Alert, oriented, follows commands CN: pupils equal and reactive,  EOMI, face symmetric, tongue midline, normal sensation over face, Motor: 5/5 strength in all 4 extremities Reflexes: 2+ bilaterally over patella, biceps, plantars: flexor Coordination: normal Gait: not tested  Basic Metabolic Panel: Recent Labs  Lab 08/08/22 1334 08/09/22 0709 08/10/22 0418  NA 130* 135 136  K 2.9* 3.2* 3.0*  CL 89* 100 104  CO2 25 22 24   GLUCOSE 97 75 88  BUN 7 7 <5*  CREATININE 0.81 0.71 0.64  CALCIUM 9.2 8.3* 8.4*  MG 1.8  --  1.9  PHOS 2.6  --   --     CBC: Recent Labs  Lab 08/08/22 1334 08/09/22 0709 08/10/22 0418  WBC 7.6 8.1 7.0  NEUTROABS 5.4  --   --   HGB 10.8* 9.0* 9.1*  HCT 32.3* 27.5* 29.0*  MCV 78.8* 80.9 83.1  PLT 277 275 251     Coagulation Studies: No results for input(s): "LABPROT", "INR" in the last 72 hours.  Imaging No new brain imaging overnight.   ASSESSMENT AND PLAN: 36 y.o. female with a past medical history of a single seizure at the age of 58, alcohol use, and marijuana use who presents emergency department with nausea, vomiting, and multiple seizure-like spells.     Seizure-like episodes -No evidence of ictal-interictal activity overnight   Recommendations: -We will continue LTM EEG overnight. -We will plan for HV and photic stimulation today -If EEG is completely normal for 72 hours then its unlikely that patient has epilepsy.  Most likely differential  will be nonepileptic events versus alcohol withdrawal seizure -Continue seizure precautions -As needed IV Ativan 2 mg for clinical seizure-like activity   I have spent a total of  36  minutes with the patient reviewing hospital notes,  test results, labs and examining the patient as well as establishing an assessment and plan that was discussed personally with the patient.  > 50% of time was spent in direct patient care.      12 Epilepsy [facility] For questions after 5pm please refer to AMION to reach the Neurologist on call

## 2022-08-10 NOTE — Plan of Care (Signed)

## 2022-08-11 ENCOUNTER — Other Ambulatory Visit (HOSPITAL_COMMUNITY): Payer: Self-pay

## 2022-08-11 DIAGNOSIS — D649 Anemia, unspecified: Secondary | ICD-10-CM | POA: Diagnosis not present

## 2022-08-11 DIAGNOSIS — E876 Hypokalemia: Secondary | ICD-10-CM | POA: Diagnosis not present

## 2022-08-11 DIAGNOSIS — F1029 Alcohol dependence with unspecified alcohol-induced disorder: Secondary | ICD-10-CM | POA: Diagnosis not present

## 2022-08-11 DIAGNOSIS — R569 Unspecified convulsions: Secondary | ICD-10-CM | POA: Diagnosis not present

## 2022-08-11 LAB — BASIC METABOLIC PANEL
Anion gap: 6 (ref 5–15)
BUN: <5 mg/dL — ABNORMAL LOW (ref 6–20)
CO2: 23 mmol/L (ref 22–32)
Calcium: 8.5 mg/dL — ABNORMAL LOW (ref 8.9–10.3)
Chloride: 107 mmol/L (ref 98–111)
Creatinine, Ser: 0.55 mg/dL (ref 0.44–1.00)
GFR, Estimated: >60 mL/min (ref >60)
Glucose, Bld: 91 mg/dL (ref 70–99)
Potassium: 3.5 mmol/L (ref 3.5–5.1)
Sodium: 136 mmol/L (ref 135–145)

## 2022-08-11 LAB — CBC
HCT: 28.5 % — ABNORMAL LOW (ref 36.0–46.0)
Hemoglobin: 9.1 g/dL — ABNORMAL LOW (ref 12.0–15.0)
MCH: 26.5 pg (ref 26.0–34.0)
MCHC: 31.9 g/dL (ref 30.0–36.0)
MCV: 83.1 fL (ref 80.0–100.0)
Platelets: 293 10*3/uL (ref 150–400)
RBC: 3.43 MIL/uL — ABNORMAL LOW (ref 3.87–5.11)
RDW: 21.2 % — ABNORMAL HIGH (ref 11.5–15.5)
WBC: 7.6 10*3/uL (ref 4.0–10.5)
nRBC: 0 % (ref 0.0–0.2)

## 2022-08-11 LAB — URINE CULTURE: Culture: NO GROWTH

## 2022-08-11 LAB — GC/CHLAMYDIA PROBE AMP (~~LOC~~) NOT AT ARMC
Chlamydia: NEGATIVE
Comment: NEGATIVE
Comment: NORMAL
Neisseria Gonorrhea: NEGATIVE

## 2022-08-11 LAB — LYME DISEASE DNA BY PCR(BORRELIA BURG): Lyme Disease(B.burgdorferi)PCR: NEGATIVE

## 2022-08-11 LAB — MAGNESIUM: Magnesium: 1.8 mg/dL (ref 1.7–2.4)

## 2022-08-11 MED ORDER — METRONIDAZOLE 500 MG PO TABS
500.0000 mg | ORAL_TABLET | Freq: Three times a day (TID) | ORAL | 0 refills | Status: AC
  Filled 2022-08-11: qty 12, 4d supply, fill #0

## 2022-08-11 NOTE — Discharge Summary (Signed)
Physician Discharge Summary  [name] [id number] DOB: [birth year] DOA: 08/08/2022  PCP: Pcp, No  Admit date: 08/08/2022 Discharge date: 08/11/2022  Admitted From: Home  Discharge disposition: Home  Recommendations for Outpatient Follow-Up:   Follow up with your primary care provider in one week.  Check CBC, BMP, magnesium in the next visit No driving recommended for at least 6 months.  Discharge Diagnosis:   Principal Problem:   Seizure ([place]) Active Problems:   Alcohol dependence ([place])   Anemia   UTI (urinary tract infection)   Trichomoniasis  Resolved problems. Hyponatremia Hypokalemia. Elevated LFTs  Discharge Condition: Improved.  Diet recommendation:.  Regular.  Wound care: None.  Code status: Full.   History of Present Illness:   [name] is a 36 y.o. female with medical history significant of distant history of seizure, alcohol use, anemia presented to the hospital with witnessed seizure-like activity at home.  Patient had recently been on a cruise a week back and had fever sore throat, nausea,vomiting chills and fatigue after travel.  At home, patient had total of 4 episodes of shaking  lasting 20 seconds each all within around 5 minutes.  Episodes were described as tonic-clonic with generalized shaking and stiffening in nature.  Patient was confused after the episode.  Patient had heavier alcohol use on her cruise but not very much since. Typically she drinks 2-3 drinks 3-4 times a week.  Patient has a history of seizure in around 2013.  No seizure since that time, not on any antiepileptics.   In the ED, vitals were significant for temperature of 100.1 F.  Labs showed sodium of 130, potassium 2.9, chloride 89, AST 63, ALT 69.  CBC with hemoglobin stable at 10.8.  Flu and COVID-negative.  Ethanol level negative.  Lumbar puncture was performed in the ED and patient was empirically started on vancomycin, ceftriaxone, acyclovir, Decadron, IV  fluids.   Neurology was consulted and recommended lumbar puncture, MRI and hold off on AED.  Patient was then considered for admission to the hospital.    Hospital Course:   Following conditions were addressed during hospitalization as listed below,  Seizure/febrile episode. Blood cultures negative in 3 days.  HIV negative.  Lumbar puncture was performed in the ED and meningitis/encephalitis panel was negative.  Autoimmune encephalitis panel, Lyme disease DNA pending.  Urine drug screen was positive for THC.  Fever has improved at this time.  Temperature max of 98.9 F in the last 24 hours. Urinalysis showed 11-20 white cells with RBC but the patient stated that she was having menstruation.  Trichomonas was present.  During hospitalization, patient received Rocephin and metronidazole.  Chest x-ray was without any infiltrate.  Patient underwent long-term EEG without any evidence of seizures.  No driving for 6 months was advised.  Communicated with neurology prior to discharge and patient is stable for disposition home at this time.  As per neurology, seizure-like episode was thought to be possible nonepileptic  versus alcohol withdrawal seizure.   Nausea, vomiting upper abdominal pain.  Right upper quadrant ultrasound showed fatty liver.  Lipase was negative..  Patient tolerated oral diet and has significantly improved   Possible UTI/trichomoniasis.  Pending gonococcal/chlamydia probe. .  Received IV Flagyl and Rocephin during hospitalization and will be continued on Flagyl on discharge.  Patient had hematuria but likely from menstruation.  Patient was counseled the need for treatment of partner   Hyponatremia Sodium level of 130 on presentation.   Latest sodium of 136.  Improved with IV fluids.   Hypokalemia Potassium level of 2.9 on presentation.  Patient required aggressive potassium replacement during hospitalization.  Potassium prior to discharge was 3.5.   Elevated LFT. Initial AST of 63  and ALT of 69.  AST ALT has improved at this time.    Prolonged QTc On presentation likely secondary to electrolyte imbalance.  Has improved at this time.    Anemia Mild.  Hemoglobin of 9.1 today.  Received IV fluids possible mild complaint of dilution.  Patient was urged to follow-up with her primary and take iron as outpatient.  History of alcohol use Patient was on CIWA without Ativan.  Did not have any withdrawal symptoms.  Patient was counseled against alcohol usage.  Disposition.   At this time, patient is stable for disposition home with outpatient PCP follow-up.  Communicated with neurology prior to discharge.  Medical Consultants:   Neurology  Procedures:    Lumbar puncture Long-term EEG  Subjective:   Today, patient was seen and examined at bedside.  Feels better.  Has been able to tolerate p.o. diet.  Denies urinary symptoms.  No fever, chills or shortness of breath.  Discharge Exam:   Vitals:   08/10/22 2045 08/11/22 0724  BP: (!) 100/58 101/66  Pulse: 65 65  Resp: 18 16  Temp: 98.6 F (37 C) 98.9 F (37.2 C)  SpO2: 100% 100%   Vitals:   08/10/22 1100 08/10/22 1500 08/10/22 2045 08/11/22 0724  BP: 110/85 124/78 (!) 100/58 101/66  Pulse: 85 89 65 65  Resp: 16 16 18 16   Temp: 97.9 F (36.6 C) 98 F (36.7 C) 98.6 F (37 C) 98.9 F (37.2 C)  TempSrc: Oral Oral Oral Oral  SpO2: 100% 100% 100% 100%  Weight:      Height:       General: Alert awake, not in obvious distress HENT: pupils equally reacting to light,  No scleral pallor or icterus noted. Oral mucosa is moist.  Chest:  Clear breath sounds.  Diminished breath sounds bilaterally. No crackles or wheezes.  CVS: S1 &S2 heard. No murmur.  Regular rate and rhythm. Abdomen: Soft, nontender, nondistended.  Bowel sounds are heard.   Extremities: No cyanosis, clubbing or edema.  Peripheral pulses are palpable. Psych: Alert, awake and oriented, normal mood CNS:  No cranial nerve deficits.  Power equal  in all extremities.   Skin: Warm and dry.  No rashes noted.  The results of significant diagnostics from this hospitalization (including imaging, microbiology, ancillary and laboratory) are listed below for reference.     Diagnostic Studies:   MR BRAIN W WO CONTRAST  Result Date: 08/09/2022 CLINICAL DATA:  Seizure, no history of trauma EXAM: MRI HEAD WITHOUT AND WITH CONTRAST TECHNIQUE: Multiplanar, multiecho pulse sequences of the brain and surrounding structures were obtained without and with intravenous contrast. CONTRAST:  6.41mL GADAVIST GADOBUTROL 1 MMOL/ML IV SOLN COMPARISON:  No prior MRI, correlation is made with CT head 08/08/2022 FINDINGS: Brain: No restricted diffusion to suggest acute or subacute infarct. No acute hemorrhage, mass, mass effect, or midline shift. No hemosiderin deposition to suggest remote hemorrhage. No abnormal parenchymal or meningeal enhancement. No abnormal T2 hyperintense signal in the periventricular or juxtacortical white matter. No hydrocephalus or extra-axial collection. Normal pituitary and craniocervical junction. The hippocampi are symmetric in size and signal. No heterotopia or evidence of cortical dysgenesis. Vascular: Normal arterial flow voids. Normal arterial and venous enhancement. Skull and upper cervical spine: Normal marrow signal. Sinuses/Orbits: No acute finding.  Other: Trace fluid in the mastoid air cells. IMPRESSION: No acute intracranial process.  No seizure etiology identified. Electronically Signed   By: Wiliam Ke M.D.   On: 08/09/2022 19:16   US ABDOMEN LIMITED RUQ (LIVER/GB)  Result Date: 08/09/2022 CLINICAL DATA:  Abdominal pain. EXAM: ULTRASOUND ABDOMEN LIMITED RIGHT UPPER QUADRANT COMPARISON:  Abdominal ultrasound 04/17/2013. CT renal stone 05/22/2021. FINDINGS: Gallbladder: No gallstones or wall thickening visualized. No sonographic Murphy sign noted by sonographer. Common bile duct: Diameter: 3 mm. Liver: No focal lesion identified.  Increase in parenchymal echogenicity. Portal vein is patent on color Doppler imaging with normal direction of blood flow towards the liver. Other: None. IMPRESSION: 1. Echogenic liver likely related to diffuse fatty infiltration. 2. No cholelithiasis. Electronically Signed   By: Darliss Cheney M.D.   On: 08/09/2022 15:07   DG CHEST PORT 1 VIEW  Result Date: 08/09/2022 CLINICAL DATA:  Seizures. EXAM: PORTABLE CHEST 1 VIEW COMPARISON:  July 06, 2020 FINDINGS: EKG leads and EG leads project over the chest. Cardiomediastinal contours and hilar structures are normal. Lungs are clear. On limited assessment no acute skeletal findings. IMPRESSION: No acute cardiopulmonary disease. Electronically Signed   By: Donzetta Kohut M.D.   On: 08/09/2022 09:26   Overnight EEG with video  Result Date: 08/09/2022 Charlsie Quest, MD     08/10/2022 10:08 AM Patient Name: PROVIDENCE STIVERS MRN: [id number] Epilepsy Attending: Charlsie Quest Referring Physician/Provider: Caryl Pina, MD Duration: 08/08/2022 1901 to 08/09/2022 1901 Patient history: 36 y.o. female with medical history significant of distant history of seizure, alcohol use, anemia presenting after seizures at home.  On EEG to evaluate for seizure. Level of alertness: Awake, asleep AEDs during EEG study: None Technical aspects: This EEG study was done with scalp electrodes positioned according to the 10-20 International system of electrode placement. Electrical activity was reviewed with band pass filter of 1-70Hz , sensitivity of 7 uV/mm, display speed of 10mm/sec with a 60Hz  notched filter applied as appropriate. EEG data were recorded continuously and digitally stored.  Video monitoring was available and reviewed as appropriate. Description: The posterior dominant rhythm consists of 9-10 Hz activity of moderate voltage (25-35 uV) seen predominantly in posterior head regions, symmetric and reactive to eye opening and eye closing. Sleep was characterized by vertex  waves, sleep spindles (12 to 14 Hz), maximal frontocentral region. Hyperventilation and photic stimulation were not performed.   IMPRESSION: This study is within normal limits. No seizures or epileptiform discharges were seen throughout the recording. A normal interictal EEG does not exclude nor support the diagnosis of epilepsy.   CT Head Wo Contrast  Result Date: 08/08/2022 CLINICAL DATA:  Seizure. EXAM: CT HEAD WITHOUT CONTRAST TECHNIQUE: Contiguous axial images were obtained from the base of the skull through the vertex without intravenous contrast. RADIATION DOSE REDUCTION: This exam was performed according to the departmental dose-optimization program which includes automated exposure control, adjustment of the mA and/or kV according to patient size and/or use of iterative reconstruction technique. COMPARISON:  January 05, 2012 FINDINGS: Brain: No evidence of acute infarction, hemorrhage, hydrocephalus, extra-axial collection or mass lesion/mass effect. Vascular: No hyperdense vessel or unexpected calcification. Skull: Normal. Negative for fracture or focal lesion. Sinuses/Orbits: No acute finding. Other: None. IMPRESSION: Normal study.  No cause for symptoms identified. Electronically Signed   By: January 07, 2012 III M.D.   On: 08/08/2022 15:48     Labs:   Basic Metabolic Panel: Recent Labs  Lab 08/08/22 1334 08/09/22 08/11/22  08/10/22 0418 08/11/22 0550  NA 130* 135 136 136  K 2.9* 3.2* 3.0* 3.5  CL 89* 100 104 107  CO2 25 22 24 23   GLUCOSE 97 75 88 91  BUN 7 7 <5* <5*  CREATININE 0.81 0.71 0.64 0.55  CALCIUM 9.2 8.3* 8.4* 8.5*  MG 1.8  --  1.9 1.8  PHOS 2.6  --   --   --    GFR Estimated Creatinine Clearance: 86.3 mL/min (by C-G formula based on SCr of 0.55 mg/dL). Liver Function Tests: Recent Labs  Lab 08/08/22 1334 08/09/22 0709 08/10/22 0418  AST 63* 36 31  ALT 69* 44 39  ALKPHOS 78 63 54  BILITOT 0.6 0.5 0.6  PROT 7.7 6.3* 5.8*  ALBUMIN 4.5 3.4* 3.2*    Recent Labs  Lab 08/08/22 1334 08/09/22 0709  LIPASE 21 25   No results for input(s): "AMMONIA" in the last 168 hours. Coagulation profile No results for input(s): "INR", "PROTIME" in the last 168 hours.  CBC: Recent Labs  Lab 08/08/22 1334 08/09/22 0709 08/10/22 0418 08/11/22 0550  WBC 7.6 8.1 7.0 7.6  NEUTROABS 5.4  --   --   --   HGB 10.8* 9.0* 9.1* 9.1*  HCT 32.3* 27.5* 29.0* 28.5*  MCV 78.8* 80.9 83.1 83.1  PLT 277 275 251 293   Cardiac Enzymes: No results for input(s): "CKTOTAL", "CKMB", "CKMBINDEX", "TROPONINI" in the last 168 hours. BNP: Invalid input(s): "POCBNP" CBG: Recent Labs  Lab 08/08/22 1323  GLUCAP 109*   D-Dimer No results for input(s): "DDIMER" in the last 72 hours. Hgb A1c No results for input(s): "HGBA1C" in the last 72 hours. Lipid Profile No results for input(s): "CHOL", "HDL", "LDLCALC", "TRIG", "CHOLHDL", "LDLDIRECT" in the last 72 hours. Thyroid function studies No results for input(s): "TSH", "T4TOTAL", "T3FREE", "THYROIDAB" in the last 72 hours.  Invalid input(s): "FREET3" Anemia work up No results for input(s): "VITAMINB12", "FOLATE", "FERRITIN", "TIBC", "IRON", "RETICCTPCT" in the last 72 hours. Microbiology Recent Results (from the past 240 hour(s))  Resp Panel by RT-PCR (Flu A&B, Covid) Anterior Nasal Swab     Status: None   Collection Time: 08/08/22  3:03 PM   Specimen: Anterior Nasal Swab  Result Value Ref Range Status   SARS Coronavirus 2 by RT PCR NEGATIVE NEGATIVE Final    Comment: (NOTE) SARS-CoV-2 target nucleic acids are NOT DETECTED.  The SARS-CoV-2 RNA is generally detectable in upper respiratory specimens during the acute phase of infection. The lowest concentration of SARS-CoV-2 viral copies this assay can detect is 138 copies/mL. A negative result does not preclude SARS-Cov-2 infection and should not be used as the sole basis for treatment or other patient management decisions. A negative result may occur  with  improper specimen collection/handling, submission of specimen other than nasopharyngeal swab, presence of viral mutation(s) within the areas targeted by this assay, and inadequate number of viral copies(<138 copies/mL). A negative result must be combined with clinical observations, patient history, and epidemiological information. The expected result is Negative.  Fact Sheet for Patients:  EntrepreneurPulse.com.au  Fact Sheet for Healthcare Providers:  IncredibleEmployment.be  This test is no t yet approved or cleared by the [place] FDA and  has been authorized for detection and/or diagnosis of SARS-CoV-2 by FDA under an Emergency Use Authorization (EUA). This EUA will remain  in effect (meaning this test can be used) for the duration of the COVID-19 declaration under Section 564(b)(1) of the Act, 21 U.S.C.section 360bbb-3(b)(1), unless the authorization is terminated  or revoked sooner.       Influenza A by PCR NEGATIVE NEGATIVE Final   Influenza B by PCR NEGATIVE NEGATIVE Final    Comment: (NOTE) The Xpert Xpress SARS-CoV-2/FLU/RSV plus assay is intended as an aid in the diagnosis of influenza from Nasopharyngeal swab specimens and should not be used as a sole basis for treatment. Nasal washings and aspirates are unacceptable for Xpert Xpress SARS-CoV-2/FLU/RSV testing.  Fact Sheet for Patients: EntrepreneurPulse.com.au  Fact Sheet for Healthcare Providers: IncredibleEmployment.be  This test is not yet approved or cleared by the [place] FDA and has been authorized for detection and/or diagnosis of SARS-CoV-2 by FDA under an Emergency Use Authorization (EUA). This EUA will remain in effect (meaning this test can be used) for the duration of the COVID-19 declaration under Section 564(b)(1) of the Act, 21 U.S.C. section 360bbb-3(b)(1), unless the authorization is terminated  or revoked.  Performed at [facility], [place] [address], [place], [place] [address]   CSF culture w Gram Stain     Status: None (Preliminary result)   Collection Time: 08/08/22  6:32 PM   Specimen: CSF; Cerebrospinal Fluid  Result Value Ref Range Status   Specimen Description CSF  Final   Special Requests NONE  Final   Gram Stain CYTOSPIN SMEAR WBC SEEN NO ORGANISMS SEEN   Final   Culture   Final    NO GROWTH 2 DAYS Performed at [facility], [place] [address] [place], [place] [address]    Report Status PENDING  Incomplete  Culture, blood (single)     Status: None (Preliminary result)   Collection Time: 08/08/22  9:54 PM   Specimen: BLOOD LEFT HAND  Result Value Ref Range Status   Specimen Description BLOOD LEFT HAND  Final   Special Requests   Final    BOTTLES DRAWN AEROBIC AND ANAEROBIC Blood Culture adequate volume   Culture   Final    NO GROWTH 3 DAYS Performed at [facility], [place] [address] [place],  [address]    Report Status PENDING  Incomplete     Discharge Instructions:   Discharge Instructions     Call MD for:  persistant nausea and vomiting   Complete by: As directed    Call MD for:  severe uncontrolled pain   Complete by: As directed    Call MD for:  temperature >100.4   Complete by: As directed    Diet general   Complete by: As directed    Discharge instructions   Complete by: As directed    Follow-up with your primary care provider in 1 week.  Check blood work at that time.  Seek medical attention for worsening symptoms.  Complete the course of antibiotic prescribed.  No driving for 6 months.   Increase activity slowly   Complete by: As directed       Allergies as of 08/11/2022   No Known Allergies      Medication List     TAKE these medications    acetaminophen 500 MG tablet Commonly known as: TYLENOL Take 1,000 mg by mouth every 6 (six) hours as needed for mild pain.   metroNIDAZOLE 500 MG tablet Commonly  known as: Flagyl Take 1 tablet (500 mg total) by mouth 3 (three) times daily for 4 days.         Time coordinating discharge: 39 minutes  Signed:  Jemya Depierro  [facility] 08/11/2022, 10:22 AM

## 2022-08-11 NOTE — Plan of Care (Signed)
  Problem: Education: Goal: Expressions of having a comfortable level of knowledge regarding the disease process will increase Outcome: Adequate for Discharge   Problem: Coping: Goal: Ability to adjust to condition or change in health will improve Outcome: Adequate for Discharge Goal: Ability to identify appropriate support needs will improve Outcome: Adequate for Discharge   Problem: Health Behavior/Discharge Planning: Goal: Compliance with prescribed medication regimen will improve Outcome: Adequate for Discharge   Problem: Medication: Goal: Risk for medication side effects will decrease Outcome: Adequate for Discharge   Problem: Clinical Measurements: Goal: Complications related to the disease process, condition or treatment will be avoided or minimized Outcome: Adequate for Discharge Goal: Diagnostic test results will improve Outcome: Adequate for Discharge   Problem: Safety: Goal: Verbalization of understanding the information provided will improve Outcome: Adequate for Discharge   Problem: Self-Concept: Goal: Level of anxiety will decrease Outcome: Adequate for Discharge Goal: Ability to verbalize feelings about condition will improve Outcome: Adequate for Discharge   Problem: Education: Goal: Knowledge of General Education information will improve Description: Including pain rating scale, medication(s)/side effects and non-pharmacologic comfort measures Outcome: Adequate for Discharge   Problem: Health Behavior/Discharge Planning: Goal: Ability to manage health-related needs will improve Outcome: Adequate for Discharge   Problem: Clinical Measurements: Goal: Ability to maintain clinical measurements within normal limits will improve Outcome: Adequate for Discharge Goal: Will remain free from infection Outcome: Adequate for Discharge Goal: Diagnostic test results will improve Outcome: Adequate for Discharge Goal: Respiratory complications will improve Outcome:  Adequate for Discharge Goal: Cardiovascular complication will be avoided Outcome: Adequate for Discharge   Problem: Activity: Goal: Risk for activity intolerance will decrease Outcome: Adequate for Discharge   Problem: Nutrition: Goal: Adequate nutrition will be maintained Outcome: Adequate for Discharge   Problem: Coping: Goal: Level of anxiety will decrease Outcome: Adequate for Discharge   Problem: Elimination: Goal: Will not experience complications related to bowel motility Outcome: Adequate for Discharge Goal: Will not experience complications related to urinary retention Outcome: Adequate for Discharge   Problem: Pain Managment: Goal: General experience of comfort will improve Outcome: Adequate for Discharge   Problem: Safety: Goal: Ability to remain free from injury will improve Outcome: Adequate for Discharge   Problem: Skin Integrity: Goal: Risk for impaired skin integrity will decrease Outcome: Adequate for Discharge   

## 2022-08-11 NOTE — Procedures (Signed)
Patient Name: [name]  MRN: [id number]  Epilepsy Attending: Charlsie Quest  Referring Physician/Provider: Caryl Pina, MD  Duration: 08/10/2022 1901 to 08/11/2022 1010   Patient history: 36 y.o. female with medical history significant of distant history of seizure, alcohol use, anemia presenting after seizures at home.  On EEG to evaluate for seizure.   Level of alertness: Awake, asleep   AEDs during EEG study: None   Technical aspects: This EEG study was done with scalp electrodes positioned according to the 10-20 International system of electrode placement. Electrical activity was reviewed with band pass filter of 1-70Hz , sensitivity of 7 uV/mm, display speed of 21mm/sec with a 60Hz  notched filter applied as appropriate. EEG data were recorded continuously and digitally stored.  Video monitoring was available and reviewed as appropriate.   Description: The posterior dominant rhythm consists of 9-10 Hz activity of moderate voltage (25-35 uV) seen predominantly in posterior head regions, symmetric and reactive to eye opening and eye closing. Sleep was characterized by vertex waves, sleep spindles (12 to 14 Hz), maximal frontocentral region. Hyperventilation and photic stimulation were not performed.      IMPRESSION: This study is within normal limits. No seizures or epileptiform discharges were seen throughout the recording.  [name] 

## 2022-08-11 NOTE — Progress Notes (Signed)
vLTM discontinued.  [facility] notified.  No skin breakdown noted at all skin sites. ?

## 2022-08-11 NOTE — Progress Notes (Signed)
Subjective: No acute events overnight.  States that she feels like she is back to baseline.  Denies any concerns.  ROS: negative except above  Examination  Vital signs in last 24 hours: Temp:  [97.9 F (36.6 C)-98.9 F (37.2 C)] 98.9 F (37.2 C) (08/16 0724) Pulse Rate:  [65-89] 65 (08/16 0724) Resp:  [16-18] 16 (08/16 0724) BP: (100-124)/(58-85) 101/66 (08/16 0724) SpO2:  [100 %] 100 % (08/16 0724)  General: lying in bed, NAD CVS: pulse-normal rate and rhythm RS: breathing comfortably Extremities: normal    Neuro: MS: Alert, oriented, follows commands CN: pupils equal and reactive,  EOMI, face symmetric, tongue midline, normal sensation over face, Motor: 5/5 strength in all 4 extremities Reflexes: 2+ bilaterally over patella, biceps, plantars: flexor Coordination: normal Gait: not tested  Basic Metabolic Panel: Recent Labs  Lab 08/08/22 1334 08/09/22 0709 08/10/22 0418 08/11/22 0550  NA 130* 135 136 136  K 2.9* 3.2* 3.0* 3.5  CL 89* 100 104 107  CO2 25 22 24 23   GLUCOSE 97 75 88 91  BUN 7 7 <5* <5*  CREATININE 0.81 0.71 0.64 0.55  CALCIUM 9.2 8.3* 8.4* 8.5*  MG 1.8  --  1.9 1.8  PHOS 2.6  --   --   --     CBC: Recent Labs  Lab 08/08/22 1334 08/09/22 0709 08/10/22 0418 08/11/22 0550  WBC 7.6 8.1 7.0 7.6  NEUTROABS 5.4  --   --   --   HGB 10.8* 9.0* 9.1* 9.1*  HCT 32.3* 27.5* 29.0* 28.5*  MCV 78.8* 80.9 83.1 83.1  PLT 277 275 251 293     Coagulation Studies: No results for input(s): "LABPROT", "INR" in the last 72 hours.  Imaging No new brain imaging overnight.     ASSESSMENT AND PLAN: 36 y.o. female with a past medical history of a single seizure at the age of 75, alcohol use, and marijuana use who presents emergency department with nausea, vomiting, and multiple seizure-like spells.     Seizure-like episodes -No evidence of ictal-interictal activity overnight.  LTM EEG was normal for over 72 hours.  These episodes are either nonepileptic  events or provoked seizure in the setting of alcohol use during medication followed by withdrawal   Recommendations: -DC  LTM EEG overnight. -Continue seizure precautions -As needed IV Ativan 2 mg for clinical seizure-like activity  Seizure precautions: Per [facility] statutes, patients with seizures are not allowed to drive until they have been seizure-free for six months and cleared by a physician    Use caution when using heavy equipment or power tools. Avoid working on ladders or at heights. Take showers instead of baths. Ensure the water temperature is not too high on the home water heater. Do not go swimming alone. Do not lock yourself in a room alone (i.e. bathroom). When caring for infants or small children, sit down when holding, feeding, or changing them to minimize risk of injury to the child in the event you have a seizure. Maintain good sleep hygiene. Avoid alcohol.    If patient has another seizure, call 911 and bring them back to the ED if: A.  The seizure lasts longer than 5 minutes.      B.  The patient doesn't wake shortly after the seizure or has new problems such as difficulty seeing, speaking or moving following the seizure C.  The patient was injured during the seizure D.  The patient has a temperature over 102 F (39C) E.  The patient vomited during the seizure and now is having trouble breathing    During the Seizure   - First, ensure adequate ventilation and place patients on the floor on their left side  Loosen clothing around the neck and ensure the airway is patent. If the patient is clenching the teeth, do not force the mouth open with any object as this can cause severe damage - Remove all items from the surrounding that can be hazardous. The patient may be oblivious to what's happening and may not even know what he or she is doing. If the patient is confused and wandering, either gently guide him/her away and block access to outside areas - Reassure the  individual and be comforting - Call 911. In most cases, the seizure ends before EMS arrives. However, there are cases when seizures may last over 3 to 5 minutes. Or the individual may have developed breathing difficulties or severe injuries. If a pregnant patient or a person with diabetes develops a seizure, it is prudent to call an ambulance. - Finally, if the patient does not regain full consciousness, then call EMS. Most patients will remain confused for about 45 to 90 minutes after a seizure, so you must use judgment in calling for help.    After the Seizure (Postictal Stage)   After a seizure, most patients experience confusion, fatigue, muscle pain and/or a headache. Thus, one should permit the individual to sleep. For the next few days, reassurance is essential. Being calm and helping reorient the person is also of importance.   Most seizures are painless and end spontaneously. Seizures are not harmful to others but can lead to complications such as stress on the lungs, brain and the heart. Individuals with prior lung problems may develop labored breathing and respiratory distress.   I have spent a total of  37  minutes with the patient reviewing hospital notes,  test results, labs and examining the patient as well as establishing an assessment and plan that was discussed personally with the patient.  > 50% of time was spent in direct patient care.    Lindie Spruce Epilepsy [facility] For questions after 5pm please refer to AMION to reach the Neurologist on call

## 2022-08-11 NOTE — ED Provider Notes (Signed)
.  Lumbar Puncture  Date/Time: 08/08/2022 6:37 PM  Performed by: Cecil Cobbs, PA-C Authorized by: Cecil Cobbs, PA-C   Consent:    Consent obtained:  Verbal and written   Consent given by:  Patient   Risks, benefits, and alternatives were discussed: yes     Risks discussed:  Bleeding, infection, pain, headache, nerve damage and repeat procedure   Alternatives discussed:  Delayed treatment and observation Universal protocol:    Procedure explained and questions answered to patient or proxy's satisfaction: yes     Site/side marked: yes     Patient identity confirmed:  Verbally with patient and arm band Pre-procedure details:    Procedure purpose:  Diagnostic   Preparation: Patient was prepped and draped in usual sterile fashion   Sedation:    Sedation type:  Anxiolysis Anesthesia:    Anesthesia method:  Local infiltration   Local anesthetic:  Lidocaine 1% w/o epi Procedure details:    Lumbar space:  L4-L5 interspace   Patient position:  L lateral decubitus   Needle gauge:  22   Needle type:  Spinal needle - Quincke tip   Ultrasound guidance: no     Number of attempts:  1   Opening pressure (cm H2O):  16   Closing pressure (cm H2O):  16   Fluid appearance:  Clear   Tubes of fluid:  5   Total volume (ml):  5 Post-procedure details:    Puncture site:  Direct pressure applied and adhesive bandage applied   Procedure completion:  Tolerated well, no immediate complications Comments:     Assisted by attending physician Dr. Minda Meo, Lennox Pippins, PA-C 08/11/22 8185    Rondel Baton, MD 08/11/22 [id number]

## 2022-08-11 NOTE — TOC Transition Note (Signed)
Transition of Care [facility]) - CM/SW Discharge Note   Patient Details  Name: [name] MRN: [id number] Date of Birth: 1986-02-03  Transition of [facility] CM/SW Contact:  Kermit Balo, RN Phone Number: 08/11/2022, 10:08 AM   Clinical Narrative:    Pt discharging home with self care. No needs per TOC.   Final next level of care: Home/Self Care Barriers to Discharge: No Barriers Identified   Patient Goals and CMS Choice        Discharge Placement                       Discharge Plan and Services   Discharge Planning Services: CM Consult                                 Social Determinants of Health (SDOH) Interventions     Readmission Risk Interventions     No data to display

## 2022-08-11 NOTE — Progress Notes (Signed)
RN was able to collect urine sample and sent it to the lab.

## 2022-08-13 LAB — CULTURE, BLOOD (SINGLE)
Culture: NO GROWTH
Special Requests: ADEQUATE

## 2022-08-15 LAB — CSF CULTURE W GRAM STAIN: Culture: NO GROWTH

## 2022-09-01 LAB — MISC LABCORP TEST (SEND OUT): Labcorp test code: 9985

## 2022-09-08 ENCOUNTER — Encounter (HOSPITAL_COMMUNITY): Payer: Self-pay

## 2022-09-08 ENCOUNTER — Other Ambulatory Visit: Payer: Self-pay

## 2022-09-08 ENCOUNTER — Emergency Department (HOSPITAL_COMMUNITY): Payer: No Typology Code available for payment source

## 2022-09-08 ENCOUNTER — Emergency Department (HOSPITAL_COMMUNITY)
Admission: EM | Admit: 2022-09-08 | Discharge: 2022-09-08 | Disposition: A | Discharge status: Home / Self Care | Payer: No Typology Code available for payment source | Attending: Emergency Medicine | Admitting: Emergency Medicine

## 2022-09-08 DIAGNOSIS — R197 Diarrhea, unspecified: Secondary | ICD-10-CM | POA: Insufficient documentation

## 2022-09-08 DIAGNOSIS — R112 Nausea with vomiting, unspecified: Secondary | ICD-10-CM | POA: Diagnosis present

## 2022-09-08 DIAGNOSIS — N9489 Other specified conditions associated with female genital organs and menstrual cycle: Secondary | ICD-10-CM | POA: Insufficient documentation

## 2022-09-08 DIAGNOSIS — Z20822 Contact with and (suspected) exposure to covid-19: Secondary | ICD-10-CM | POA: Diagnosis not present

## 2022-09-08 DIAGNOSIS — R109 Unspecified abdominal pain: Secondary | ICD-10-CM | POA: Diagnosis not present

## 2022-09-08 DIAGNOSIS — D72829 Elevated white blood cell count, unspecified: Secondary | ICD-10-CM | POA: Diagnosis not present

## 2022-09-08 LAB — COMPREHENSIVE METABOLIC PANEL
ALT: 109 U/L — ABNORMAL HIGH (ref 0–44)
AST: 177 U/L — ABNORMAL HIGH (ref 15–41)
Albumin: 4.5 g/dL (ref 3.5–5.0)
Alkaline Phosphatase: 72 U/L (ref 38–126)
Anion gap: 15 (ref 5–15)
BUN: 5 mg/dL — ABNORMAL LOW (ref 6–20)
CO2: 22 mmol/L (ref 22–32)
Calcium: 9.5 mg/dL (ref 8.9–10.3)
Chloride: 101 mmol/L (ref 98–111)
Creatinine, Ser: 0.74 mg/dL (ref 0.44–1.00)
GFR, Estimated: >60 mL/min (ref >60)
Glucose, Bld: 98 mg/dL (ref 70–99)
Potassium: 3.6 mmol/L (ref 3.5–5.1)
Sodium: 138 mmol/L (ref 135–145)
Total Bilirubin: 1 mg/dL (ref 0.3–1.2)
Total Protein: 7.8 g/dL (ref 6.5–8.1)

## 2022-09-08 LAB — CBC
HCT: 30.9 % — ABNORMAL LOW (ref 36.0–46.0)
Hemoglobin: 10 g/dL — ABNORMAL LOW (ref 12.0–15.0)
MCH: 26.9 pg (ref 26.0–34.0)
MCHC: 32.4 g/dL (ref 30.0–36.0)
MCV: 83.1 fL (ref 80.0–100.0)
Platelets: 268 10*3/uL (ref 150–400)
RBC: 3.72 MIL/uL — ABNORMAL LOW (ref 3.87–5.11)
RDW: 23 % — ABNORMAL HIGH (ref 11.5–15.5)
WBC: 8.4 10*3/uL (ref 4.0–10.5)
nRBC: 0 % (ref 0.0–0.2)

## 2022-09-08 LAB — RESP PANEL BY RT-PCR (FLU A&B, COVID) ARPGX2
Influenza A by PCR: NEGATIVE
Influenza B by PCR: NEGATIVE
SARS Coronavirus 2 by RT PCR: NEGATIVE

## 2022-09-08 LAB — I-STAT BETA HCG BLOOD, ED (MC, WL, AP ONLY): I-stat hCG, quantitative: <5 m[IU]/mL (ref <5)

## 2022-09-08 LAB — LIPASE, BLOOD: Lipase: 21 U/L (ref 11–51)

## 2022-09-08 MED ORDER — LACTATED RINGERS IV BOLUS
1000.0000 mL | Freq: Once | INTRAVENOUS | Status: AC
  Administered 2022-09-08: 1000 mL via INTRAVENOUS

## 2022-09-08 MED ORDER — FAMOTIDINE IN NACL 20-0.9 MG/50ML-% IV SOLN
20.0000 mg | Freq: Once | INTRAVENOUS | Status: AC
  Administered 2022-09-08: 20 mg via INTRAVENOUS
  Filled 2022-09-08: qty 50

## 2022-09-08 MED ORDER — ONDANSETRON 4 MG PO TBDP: 4.0000 mg | ORAL_TABLET | Freq: Once | ORAL | Status: DC | PRN

## 2022-09-08 MED ORDER — ONDANSETRON HCL 4 MG/2ML IJ SOLN
4.0000 mg | Freq: Once | INTRAMUSCULAR | Status: AC
  Administered 2022-09-08: 4 mg via INTRAVENOUS
  Filled 2022-09-08: qty 2

## 2022-09-08 MED ORDER — ONDANSETRON HCL 4 MG PO TABS: 4.0000 mg | ORAL_TABLET | Freq: Four times a day (QID) | ORAL | 0 refills | Status: DC

## 2022-09-08 MED ORDER — HALOPERIDOL LACTATE 5 MG/ML IJ SOLN
1.0000 mg | Freq: Once | INTRAMUSCULAR | Status: AC
  Administered 2022-09-08: 1 mg via INTRAVENOUS
  Filled 2022-09-08: qty 1

## 2022-09-08 MED ORDER — ALUM & MAG HYDROXIDE-SIMETH 200-200-20 MG/5ML PO SUSP
30.0000 mL | Freq: Once | ORAL | Status: AC
  Administered 2022-09-08: 30 mL via ORAL
  Filled 2022-09-08: qty 30

## 2022-09-08 MED ORDER — IOHEXOL 350 MG/ML SOLN
70.0000 mL | Freq: Once | INTRAVENOUS | Status: AC | PRN
  Administered 2022-09-08: 70 mL via INTRAVENOUS

## 2022-09-08 MED ORDER — ONDANSETRON 4 MG PO TBDP
4.0000 mg | ORAL_TABLET | Freq: Once | ORAL | Status: AC
  Administered 2022-09-08: 4 mg via ORAL
  Filled 2022-09-08: qty 1

## 2022-09-08 NOTE — ED Provider Notes (Signed)
[facility] EMERGENCY DEPARTMENT Provider Note   CSN: [id number] Arrival date & time: 09/08/22  1146     History  Chief Complaint  Patient presents with   Abdominal Pain   Emesis    [name] is a 36 y.o. female.  HPI 36 year old female presents to the ER with concerns for diarrhea, nausea and vomiting.  Diarrhea started on Saturday, has been watery.  She stated that she had some alcohol with her sister on Sunday as well as THC and then started to have vomiting on Monday.  No hemoptysis.  No hematochezia.  She has not been able to tolerate anything p.o. per her report.  She endorses some abdominal discomfort but no actual pain.  Denies any urinary symptoms.  She states that she presents to the ER as about a month ago she presented with a similar presentation with diarrhea and nausea and vomiting and then had some seizure-like activity.  She states that she is here to "stay ahead of it at this time".  She denies any known fevers or chills.  She denies any seizure-like activity recently.  Denies any vaginal bleeding or discharge.    Home Medications Prior to Admission medications   Medication Sig Start Date End Date Taking? Authorizing Provider  acetaminophen (TYLENOL) 500 MG tablet Take 1,000 mg by mouth every 6 (six) hours as needed for mild pain.    [provider]  fluticasone (FLONASE) 50 MCG/ACT nasal spray Place 1 spray into both nostrils daily. 12/13/18 07/06/20  Aviva Kluver B, PA-C  omeprazole (PRILOSEC) 20 MG capsule Take 1 capsule (20 mg total) by mouth daily. Patient not taking: Reported on 12/13/2018 01/28/17 07/06/20  Glynn Octave, MD  sucralfate (CARAFATE) 1 g tablet Take 1 tablet (1 g total) by mouth 4 (four) times daily -  with meals and at bedtime. Patient not taking: Reported on 12/13/2018 01/28/17 07/06/20  Glynn Octave, MD      Allergies    Patient has no known allergies.    Review of Systems   Review of Systems Ten  systems reviewed and are negative for acute change, except as noted in the HPI.   Physical Exam Updated Vital Signs BP 122/65   Pulse (!) 54   Temp 98.2 F (36.8 C) (Oral)   Resp 18   Ht 5\' 2"  (1.575 m)   Wt 64 kg   SpO2 100%   BMI 25.79 kg/m  Physical Exam Vitals and nursing note reviewed.  Constitutional:      General: She is not in acute distress.    Appearance: She is well-developed.  HENT:     Head: Normocephalic and atraumatic.  Eyes:     Conjunctiva/sclera: Conjunctivae normal.  Cardiovascular:     Rate and Rhythm: Normal rate and regular rhythm.     Heart sounds: No murmur heard. Pulmonary:     Effort: Pulmonary effort is normal. No respiratory distress.     Breath sounds: Normal breath sounds.  Abdominal:     Palpations: Abdomen is soft.     Tenderness: There is no abdominal tenderness.  Musculoskeletal:        General: No swelling.     Cervical back: Neck supple.  Skin:    General: Skin is warm and dry.     Capillary Refill: Capillary refill takes less than 2 seconds.  Neurological:     Mental Status: She is alert.  Psychiatric:        Mood and Affect: Mood  normal.     ED Results / Procedures / Treatments   Labs (all labs ordered are listed, but only abnormal results are displayed) Labs Reviewed  COMPREHENSIVE METABOLIC PANEL - Abnormal; Notable for the following components:      Result Value   BUN 5 (*)    AST 177 (*)    ALT 109 (*)    All other components within normal limits  CBC - Abnormal; Notable for the following components:   RBC 3.72 (*)    Hemoglobin 10.0 (*)    HCT 30.9 (*)    RDW 23.0 (*)    All other components within normal limits  RESP PANEL BY RT-PCR (FLU A&B, COVID) ARPGX2  LIPASE, BLOOD  URINALYSIS, ROUTINE W REFLEX MICROSCOPIC  I-STAT BETA HCG BLOOD, ED ([facility], [facility], [facility] ONLY)    EKG None  Radiology No results found.  Procedures Procedures    Medications Ordered in ED Medications  ondansetron (ZOFRAN-ODT)  disintegrating tablet 4 mg (has no administration in time range)  lactated ringers bolus 1,000 mL (1,000 mLs Intravenous New Bag/Given 09/08/22 1412)  alum & mag hydroxide-simeth (MAALOX/MYLANTA) 200-200-20 MG/5ML suspension 30 mL (30 mLs Oral Given 09/08/22 1412)  haloperidol lactate (HALDOL) injection 1 mg (1 mg Intravenous Given 09/08/22 1411)    ED Course/ Medical Decision Making/ A&P                           Medical Decision Making Amount and/or Complexity of Data Reviewed Labs: ordered. Radiology: ordered.  Risk OTC drugs. Prescription drug management.   36 year old female presenting with nausea and vomiting.  Vitals overall reassuring.  Physical exam with some mild abdominal discomfort but no focal tenderness.  No CVA tenderness.  Differential diagnosis includes cannabis hyperemesis, viral gastroenteritis, diverticulitis, infectious diarrhea, cholecystitis, acute liver failure, COVID-19  Labs ordered, reviewed and interpreted by me CBC with leukocytosis, hemoglobin of 10, stable.   CMP without any electrolyte abnormalities, AST and ALT are 177 and 109 respectively, somewhat more elevated than her prior.  COVID and flu are negative.   EKG no ischemic  Patient was given a GI cocktail, Haldol, LR bolus and Zofran for nausea.  Attempting p.o. challenge.  Will obtain CT of the abdomen pelvis given more significant LFT elevation.  Care signed out to [facility] who will oversee the rest of her workup and dispo accordingly.  Final Clinical Impression(s) / ED Diagnoses Final diagnoses:  Nausea vomiting and diarrhea    Rx / DC Orders ED Discharge Orders     None         Leone Brand 09/08/22 1618    Mardene Sayer, MD 09/08/22 1929

## 2022-09-08 NOTE — ED Provider Triage Note (Signed)
Emergency Medicine Provider Triage Evaluation Note  [name] , a 36 y.o. female  was evaluated in triage.  Pt complains of diarrhea since Saturday and N/V starting the next day.  Both vomiting and diarrhea described as nonbloody.  Feels very dehydrated.  Unable to keep down food and fluids.  Notes a "burning sensation in her throat from vomiting previously.  Recent marijuana and alcohol use before symptom onset.  Denies fever, chills, vision changes.  Still able to make urine, though somewhat decreased output.  Review of Systems  Positive:  Negative: See above  Physical Exam  BP (!) 156/101 (BP Location: Right Arm)   Pulse 62   Temp 98.3 F (36.8 C) (Oral)   Resp (!) 22   Ht 5\' 2"  (1.575 m)   Wt 64 kg   SpO2 100%   BMI 25.79 kg/m  Gen:   Awake, no distress   Resp:  Normal effort  MSK:   Moves extremities without difficulty  Other:  Appears mildly clinically dehydrated  Medical Decision Making  Medically screening exam initiated at 12:30 PM.  Appropriate orders placed.  was informed that the remainder of the evaluation will be completed by another provider, this initial triage assessment does not replace that evaluation, and the importance of remaining in the ED until their evaluation is complete.     Deborah Mellow, PA-C 09/08/22 1232

## 2022-09-08 NOTE — Discharge Instructions (Addendum)
Work-up today for your nausea vomiting diarrhea was overall reassuring.  CT scan did not reveal any concerning acute process that may be contributing to her symptoms.  Recommend that you continue hydration daily.  Also recommend that you follow-up with your PCP regarding ongoing nausea vomiting diarrhea.  I have ordered Zofran to be taken as needed for nausea and vomiting.  If you have new abdominal pain, new fever, bloody urine or, bloody stools, bloody vomit please return to the emergency room for further evaluation.

## 2022-09-08 NOTE — ED Notes (Signed)
RN reviewed discharge instructions with pt. Pt verbalized understanding and had no further questions. VSS upon discharge.  

## 2022-09-08 NOTE — ED Notes (Signed)
Pt requesting one more dose of nausea medication before discharge. Roxan Hockey PA made aware

## 2022-09-08 NOTE — ED Triage Notes (Addendum)
Started with diarrhea 2 days ago and now having n/v all night complaining of esophagus burning.  Reports unable to hold anything down. Denies urinary symptoms. Patient smoke marijuana Sunday and drank Sunday and started feeling bad Sunday night/Monday morning. Hx of alcoholic gastritis

## 2022-09-08 NOTE — ED Notes (Signed)
Pt was able to hold down the fluids wo n/v.  

## 2022-09-08 NOTE — ED Provider Notes (Signed)
Accepted handoff at shift change from [facility] PA-C. Please see prior provider note for more detail.   Briefly: Patient is 36 y.o.   DDX: concern for biliary obstruction, gallbladder pathology, acute liver failure  Plan: Abdominal and pelvic CT scan.  If unremarkable can p.o. challenge and potentially dispo home      RISR  EDTHIS  Physical Exam  BP 122/65   Pulse (!) 54   Temp 98.2 F (36.8 C) (Oral)   Resp 18   Ht 5\' 2"  (1.575 m)   Wt 64 kg   SpO2 100%   BMI 25.79 kg/m   Physical Exam  Procedures  Procedures  ED Course / MDM    Medical Decision Making Amount and/or Complexity of Data Reviewed Labs: ordered. Radiology: ordered.  Risk OTC drugs. Prescription drug management.          , PA-C 09/08/22 1617    [place], 09/10/22, MD 09/09/22 [phone]

## 2022-09-17 ENCOUNTER — Emergency Department (HOSPITAL_COMMUNITY): Payer: No Typology Code available for payment source

## 2022-09-17 ENCOUNTER — Emergency Department (HOSPITAL_COMMUNITY)
Admission: EM | Admit: 2022-09-17 | Discharge: 2022-09-18 | Disposition: A | Discharge status: Home / Self Care | Payer: No Typology Code available for payment source | Attending: Emergency Medicine | Admitting: Emergency Medicine

## 2022-09-17 ENCOUNTER — Encounter (HOSPITAL_COMMUNITY): Payer: Self-pay

## 2022-09-17 DIAGNOSIS — R7401 Elevation of levels of liver transaminase levels: Secondary | ICD-10-CM | POA: Diagnosis not present

## 2022-09-17 DIAGNOSIS — Y908 Blood alcohol level of 240 mg/100 ml or more: Secondary | ICD-10-CM | POA: Diagnosis not present

## 2022-09-17 DIAGNOSIS — W19XXXA Unspecified fall, initial encounter: Secondary | ICD-10-CM | POA: Insufficient documentation

## 2022-09-17 DIAGNOSIS — F10129 Alcohol abuse with intoxication, unspecified: Secondary | ICD-10-CM | POA: Diagnosis not present

## 2022-09-17 DIAGNOSIS — S9782XA Crushing injury of left foot, initial encounter: Secondary | ICD-10-CM | POA: Diagnosis present

## 2022-09-17 DIAGNOSIS — E876 Hypokalemia: Secondary | ICD-10-CM | POA: Diagnosis not present

## 2022-09-17 LAB — CBC
HCT: 30.5 % — ABNORMAL LOW (ref 36.0–46.0)
Hemoglobin: 9.7 g/dL — ABNORMAL LOW (ref 12.0–15.0)
MCH: 26.3 pg (ref 26.0–34.0)
MCHC: 31.8 g/dL (ref 30.0–36.0)
MCV: 82.7 fL (ref 80.0–100.0)
Platelets: 535 10*3/uL — ABNORMAL HIGH (ref 150–400)
RBC: 3.69 MIL/uL — ABNORMAL LOW (ref 3.87–5.11)
RDW: 22 % — ABNORMAL HIGH (ref 11.5–15.5)
WBC: 5.4 10*3/uL (ref 4.0–10.5)
nRBC: 0 % (ref 0.0–0.2)

## 2022-09-17 LAB — COMPREHENSIVE METABOLIC PANEL
ALT: 63 U/L — ABNORMAL HIGH (ref 0–44)
AST: 47 U/L — ABNORMAL HIGH (ref 15–41)
Albumin: 4.3 g/dL (ref 3.5–5.0)
Alkaline Phosphatase: 69 U/L (ref 38–126)
Anion gap: 13 (ref 5–15)
BUN: 7 mg/dL (ref 6–20)
CO2: 24 mmol/L (ref 22–32)
Calcium: 8.7 mg/dL — ABNORMAL LOW (ref 8.9–10.3)
Chloride: 108 mmol/L (ref 98–111)
Creatinine, Ser: 0.64 mg/dL (ref 0.44–1.00)
GFR, Estimated: >60 mL/min (ref >60)
Glucose, Bld: 75 mg/dL (ref 70–99)
Potassium: 3.2 mmol/L — ABNORMAL LOW (ref 3.5–5.1)
Sodium: 145 mmol/L (ref 135–145)
Total Bilirubin: <0.1 mg/dL — ABNORMAL LOW (ref 0.3–1.2)
Total Protein: 7.4 g/dL (ref 6.5–8.1)

## 2022-09-17 LAB — I-STAT BETA HCG BLOOD, ED (MC, WL, AP ONLY): I-stat hCG, quantitative: <5 m[IU]/mL (ref <5)

## 2022-09-17 LAB — ETHANOL: Alcohol, Ethyl (B): 305 mg/dL (ref <10)

## 2022-09-17 MED ORDER — SODIUM CHLORIDE 0.9 % IV BOLUS
500.0000 mL | Freq: Once | INTRAVENOUS | Status: AC
  Administered 2022-09-18: 500 mL via INTRAVENOUS

## 2022-09-17 MED ORDER — POTASSIUM CHLORIDE CRYS ER 20 MEQ PO TBCR
40.0000 meq | EXTENDED_RELEASE_TABLET | Freq: Once | ORAL | Status: AC
  Administered 2022-09-18: 40 meq via ORAL
  Filled 2022-09-17: qty 2

## 2022-09-17 MED ORDER — LORAZEPAM 2 MG/ML IJ SOLN
1.0000 mg | Freq: Once | INTRAMUSCULAR | Status: DC
  Filled 2022-09-17: qty 1

## 2022-09-17 MED ORDER — HYDROMORPHONE HCL 1 MG/ML IJ SOLN
0.5000 mg | Freq: Once | INTRAMUSCULAR | Status: AC
  Administered 2022-09-17: 0.5 mg via INTRAVENOUS
  Filled 2022-09-17: qty 1

## 2022-09-17 MED ORDER — ONDANSETRON HCL 4 MG/2ML IJ SOLN
4.0000 mg | Freq: Once | INTRAMUSCULAR | Status: AC
  Administered 2022-09-17: 4 mg via INTRAVENOUS
  Filled 2022-09-17: qty 2

## 2022-09-17 NOTE — ED Notes (Signed)
Pt to CT on full cardiac monitor with Leafy Ro, RN

## 2022-09-17 NOTE — Progress Notes (Signed)
Orthopedic Tech Progress Note Patient Details:  [name] August 27, 1986 [id number]  Patient ID: [name], female   DOB: 12/11/1986, 36 y.o.   MRN: [id number] Level II; not currently needed. [name] 09/17/2022, 7:22 PM

## 2022-09-17 NOTE — ED Provider Notes (Signed)
Foot run over Drunk Bandage foot Road test   Physical Exam  BP (!) 92/56   Pulse 74   Temp 98.1 F (36.7 C) (Oral)   Resp 14   Ht 5\' 2"  (1.575 m)   Wt 63.5 kg   SpO2 96%   BMI 25.61 kg/m   Physical Exam  Procedures  Procedures  ED Course / MDM    Medical Decision Making Amount and/or Complexity of Data Reviewed Labs: ordered. Radiology: ordered.  Risk Prescription drug management.   ***

## 2022-09-17 NOTE — ED Notes (Signed)
Pt returns from imaging. Sharol Given, RN at bedside attempting IV access.
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
## 2022-09-17 NOTE — ED Notes (Signed)
Pt sleeping from the pain med given

## 2022-09-17 NOTE — Discharge Instructions (Signed)
The CT scans of your head and neck as well as the x-rays of your body did not show any signs of severe traumatic injury or broken bones.  Your potassium was a little low on your blood work-please include potassium rich foods in your diet and have this rechecked by your primary care provider..  Your alcohol level was high.  Please utilize the crutches given as needed.  You may put weight on the foot as able to tolerate.  We have provided the walking boot to try to help with stability when putting weight on the foot.  Please keep this elevated as best as possible, apply ice wrapped in a towel 20 minutes on 40 minutes off for the next 48 hours, and please try to rest.  Take Motrin/Tylenol as needed for pain.  Be sure to take Motrin with food as it can cause stomach upset and or stomach bleeding.   Please follow-up with orthopedics if you continue to have pain over the weekend, call to make an appointment, this is in case you have an injury that was not able to be seen on initial xrays.  Otherwise please follow-up with your primary care provider.  Return to the emergency department for any new or worsening symptoms or any other concerns.

## 2022-09-17 NOTE — ED Notes (Signed)
The pt is sleeping still from the pain med
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
## 2022-09-17 NOTE — ED Notes (Signed)
This RN, and Leafy Ro, RN attempted IV stick that was unsuccessful. Vonna Kotyk, [place] informed. States he will attempt an US guided IV after patient returns from CT.

## 2022-09-17 NOTE — ED Notes (Signed)
Phlebotomy attempted blood draw with no success

## 2022-09-17 NOTE — ED Notes (Signed)
An attempt was made to clean and bandage her lt foot  the pt would not tolerate that  family at  the bedside

## 2022-09-18 MED ORDER — HYDROMORPHONE HCL 1 MG/ML IJ SOLN
0.5000 mg | Freq: Once | INTRAMUSCULAR | Status: AC
  Administered 2022-09-18: 0.5 mg via INTRAVENOUS
  Filled 2022-09-18: qty 1

## 2022-09-18 NOTE — ED Notes (Signed)
Pt asking for tylenol. Pt informed that there was no tylenol ordered and that I will message the DR to get her some tylenol. Pt stating that she is ready to be wheeled to the lobby. Pt provided discharge paperwork, IV removed. Pt states "I am ready to leave."

## 2022-09-18 NOTE — Progress Notes (Signed)
Orthopedic Tech Progress Note Patient Details:  [name] [birth year] [id number]  Ortho Devices Type of Ortho Device: Crutches, CAM walker Ortho Device/Splint Location: lle Ortho Device/Splint Interventions: Ordered, Application, Adjustment   Post Interventions Patient Tolerated: Well Instructions Provided: Adjustment of device, Care of device, Poper ambulation with device  [name] [name] 09/18/2022, 4:06 AM

## 2022-09-18 NOTE — ED Notes (Signed)
The pt is c/o severe pain in her lt foot

## 2022-09-18 NOTE — ED Notes (Signed)
Family member was here then left not sure of the time  the pa was giving an update on her to family  we think that that person left

## 2022-09-18 NOTE — ED Notes (Signed)
Lt foot cleaned betadine  and bandage

## 2022-09-18 NOTE — ED Notes (Signed)
Pt continues to c/o pain in foot yet she keeps both eyes closed   intermittently asks for pain

## 2022-09-18 NOTE — ED Notes (Signed)
The pt sleeps between c/o foot pain

## 2022-11-15 ENCOUNTER — Other Ambulatory Visit: Payer: Self-pay | Admitting: Orthopedic Surgery

## 2022-11-15 DIAGNOSIS — S92323A Displaced fracture of second metatarsal bone, unspecified foot, initial encounter for closed fracture: Secondary | ICD-10-CM

## 2022-11-15 DIAGNOSIS — S9782XA Crushing injury of left foot, initial encounter: Secondary | ICD-10-CM

## 2022-12-05 ENCOUNTER — Ambulatory Visit
Admission: RE | Admit: 2022-12-05 | Discharge: 2022-12-05 | Disposition: A | Discharge status: Home / Self Care | Payer: No Typology Code available for payment source | Source: Ambulatory Visit | Attending: Orthopedic Surgery | Admitting: Orthopedic Surgery

## 2022-12-05 DIAGNOSIS — S92323A Displaced fracture of second metatarsal bone, unspecified foot, initial encounter for closed fracture: Secondary | ICD-10-CM

## 2022-12-05 DIAGNOSIS — S9782XA Crushing injury of left foot, initial encounter: Secondary | ICD-10-CM

## 2023-05-31 ENCOUNTER — Ambulatory Visit: Payer: No Typology Code available for payment source | Admitting: Physical Therapy

## 2023-06-15 ENCOUNTER — Ambulatory Visit: Payer: No Typology Code available for payment source | Attending: Student | Admitting: Physical Therapy

## 2023-07-04 ENCOUNTER — Emergency Department (HOSPITAL_COMMUNITY): Payer: No Typology Code available for payment source

## 2023-07-04 ENCOUNTER — Encounter (HOSPITAL_COMMUNITY): Payer: Self-pay | Admitting: Emergency Medicine

## 2023-07-04 ENCOUNTER — Other Ambulatory Visit: Payer: Self-pay

## 2023-07-04 ENCOUNTER — Emergency Department (HOSPITAL_COMMUNITY)
Admission: EM | Admit: 2023-07-04 | Discharge: 2023-07-04 | Disposition: A | Discharge status: Home / Self Care | Payer: No Typology Code available for payment source | Attending: Emergency Medicine | Admitting: Emergency Medicine

## 2023-07-04 DIAGNOSIS — E876 Hypokalemia: Secondary | ICD-10-CM | POA: Diagnosis not present

## 2023-07-04 DIAGNOSIS — R7401 Elevation of levels of liver transaminase levels: Secondary | ICD-10-CM | POA: Diagnosis not present

## 2023-07-04 DIAGNOSIS — R109 Unspecified abdominal pain: Secondary | ICD-10-CM | POA: Diagnosis not present

## 2023-07-04 DIAGNOSIS — R1013 Epigastric pain: Secondary | ICD-10-CM | POA: Insufficient documentation

## 2023-07-04 DIAGNOSIS — R509 Fever, unspecified: Secondary | ICD-10-CM | POA: Insufficient documentation

## 2023-07-04 DIAGNOSIS — R112 Nausea with vomiting, unspecified: Secondary | ICD-10-CM | POA: Diagnosis not present

## 2023-07-04 DIAGNOSIS — F17200 Nicotine dependence, unspecified, uncomplicated: Secondary | ICD-10-CM | POA: Diagnosis not present

## 2023-07-04 DIAGNOSIS — R1011 Right upper quadrant pain: Secondary | ICD-10-CM | POA: Insufficient documentation

## 2023-07-04 DIAGNOSIS — R0602 Shortness of breath: Secondary | ICD-10-CM | POA: Insufficient documentation

## 2023-07-04 DIAGNOSIS — D649 Anemia, unspecified: Secondary | ICD-10-CM | POA: Diagnosis not present

## 2023-07-04 DIAGNOSIS — K7689 Other specified diseases of liver: Secondary | ICD-10-CM | POA: Diagnosis not present

## 2023-07-04 DIAGNOSIS — K760 Fatty (change of) liver, not elsewhere classified: Secondary | ICD-10-CM | POA: Diagnosis not present

## 2023-07-04 DIAGNOSIS — R0789 Other chest pain: Secondary | ICD-10-CM | POA: Insufficient documentation

## 2023-07-04 LAB — CBC WITH DIFFERENTIAL/PLATELET
Abs Immature Granulocytes: 0 10*3/uL (ref 0.00–0.07)
Basophils Absolute: 0 10*3/uL (ref 0.0–0.1)
Basophils Relative: 1 %
Eosinophils Absolute: 0.1 10*3/uL (ref 0.0–0.5)
Eosinophils Relative: 2 %
HCT: 32 % — ABNORMAL LOW (ref 36.0–46.0)
Hemoglobin: 10.4 g/dL — ABNORMAL LOW (ref 12.0–15.0)
Immature Granulocytes: 0 %
Lymphocytes Relative: 26 %
Lymphs Abs: 1 10*3/uL (ref 0.7–4.0)
MCH: 26.4 pg (ref 26.0–34.0)
MCHC: 32.5 g/dL (ref 30.0–36.0)
MCV: 81.2 fL (ref 80.0–100.0)
Monocytes Absolute: 0.5 10*3/uL (ref 0.1–1.0)
Monocytes Relative: 13 %
Neutro Abs: 2.3 10*3/uL (ref 1.7–7.7)
Neutrophils Relative %: 58 %
Platelets: 234 10*3/uL (ref 150–400)
RBC: 3.94 MIL/uL (ref 3.87–5.11)
RDW: 23.7 % — ABNORMAL HIGH (ref 11.5–15.5)
WBC: 4 10*3/uL (ref 4.0–10.5)
nRBC: 0 % (ref 0.0–0.2)

## 2023-07-04 LAB — COMPREHENSIVE METABOLIC PANEL
ALT: 48 U/L — ABNORMAL HIGH (ref 0–44)
AST: 52 U/L — ABNORMAL HIGH (ref 15–41)
Albumin: 4.1 g/dL (ref 3.5–5.0)
Alkaline Phosphatase: 74 U/L (ref 38–126)
Anion gap: 13 (ref 5–15)
BUN: 7 mg/dL (ref 6–20)
CO2: 21 mmol/L — ABNORMAL LOW (ref 22–32)
Calcium: 8.9 mg/dL (ref 8.9–10.3)
Chloride: 102 mmol/L (ref 98–111)
Creatinine, Ser: 0.71 mg/dL (ref 0.44–1.00)
GFR, Estimated: >60 mL/min (ref >60)
Glucose, Bld: 108 mg/dL — ABNORMAL HIGH (ref 70–99)
Potassium: 3 mmol/L — ABNORMAL LOW (ref 3.5–5.1)
Sodium: 136 mmol/L (ref 135–145)
Total Bilirubin: 0.7 mg/dL (ref 0.3–1.2)
Total Protein: 7.4 g/dL (ref 6.5–8.1)

## 2023-07-04 LAB — LIPASE, BLOOD: Lipase: 25 U/L (ref 11–51)

## 2023-07-04 LAB — TROPONIN I (HIGH SENSITIVITY)
Troponin I (High Sensitivity): 3 ng/L (ref <18)
Troponin I (High Sensitivity): 2 ng/L (ref <18)

## 2023-07-04 LAB — HCG, QUANTITATIVE, PREGNANCY: hCG, Beta Chain, Quant, S: <1 m[IU]/mL (ref <5)

## 2023-07-04 MED ORDER — SODIUM CHLORIDE 0.9 % IV BOLUS
1000.0000 mL | Freq: Once | INTRAVENOUS | Status: AC
  Administered 2023-07-04: 1000 mL via INTRAVENOUS

## 2023-07-04 MED ORDER — PANTOPRAZOLE SODIUM 40 MG PO TBEC: 40.0000 mg | DELAYED_RELEASE_TABLET | Freq: Every day | ORAL | 0 refills | Status: DC

## 2023-07-04 MED ORDER — ONDANSETRON HCL 4 MG/2ML IJ SOLN
4.0000 mg | Freq: Once | INTRAMUSCULAR | Status: AC
  Administered 2023-07-04: 4 mg via INTRAVENOUS
  Filled 2023-07-04: qty 2

## 2023-07-04 MED ORDER — SUCRALFATE 1 G PO TABS: 1.0000 g | ORAL_TABLET | Freq: Three times a day (TID) | ORAL | 1 refills | Status: DC

## 2023-07-04 MED ORDER — MORPHINE SULFATE (PF) 4 MG/ML IV SOLN
4.0000 mg | Freq: Once | INTRAVENOUS | Status: AC
  Administered 2023-07-04: 4 mg via INTRAVENOUS
  Filled 2023-07-04: qty 1

## 2023-07-04 MED ORDER — DROPERIDOL 2.5 MG/ML IJ SOLN
1.2500 mg | Freq: Once | INTRAMUSCULAR | Status: AC
  Administered 2023-07-04: 1.25 mg via INTRAVENOUS
  Filled 2023-07-04: qty 2

## 2023-07-04 MED ORDER — IOHEXOL 300 MG/ML  SOLN
100.0000 mL | Freq: Once | INTRAMUSCULAR | Status: AC | PRN
  Administered 2023-07-04: 100 mL via INTRAVENOUS

## 2023-07-04 MED ORDER — ONDANSETRON 4 MG PO TBDP: 4.0000 mg | ORAL_TABLET | Freq: Three times a day (TID) | ORAL | 0 refills | Status: AC | PRN

## 2023-07-04 MED ORDER — SODIUM CHLORIDE (PF) 0.9 % IJ SOLN
INTRAMUSCULAR | Status: AC
  Filled 2023-07-04: qty 50

## 2023-07-04 NOTE — Discharge Instructions (Signed)
You have been prescribed Protonix (Pantoprazole). Take 1 tablet by mouth every day for the next 8 weeks  You have been prescribed Carafate (Sucralfate). Take 1 tablet by mouth twice a day.   You have been prescribed Zofran (Ondansetron) to use every 8 hours as needed for nausea and vomiting.   Do not take any NSAID medications such as ibuprofen (motrin), naproxen (aleve).    Make an appointment as soon as possible with your PCP to discuss making an appointment with gastroenterology for evaluation of possible peptic ulcer.   Return to the ER if you have uncontrolled vomiting or diarrhea, blood in your stool or vomit, black stools, you have severe abdominal pain, you become very dizzy, you experience chest pain or shortness of breath.

## 2023-07-04 NOTE — ED Triage Notes (Signed)
Pt endorses trouble breathing and CP since Saturday. States its hard to describe but feels like she needs to burp really bad. Denies cardiac hx. Pain to center of chest with no movement.

## 2023-07-04 NOTE — ED Provider Notes (Signed)
[place] EMERGENCY DEPARTMENT AT [facility] Provider Note   CSN: [id number] Arrival date & time: 07/04/23  5784     History  Chief Complaint  Patient presents with   Shortness of Breath   Chest Pain    [name] is a 37 y.o. female who smokes presenting with concern of lower chest pain and upper abdominal pain for 3 days. She points to the location of pain in the epigastric region.  This pain came on suddenly and has remained constant. Nothing that seems to make the pain better or worse. She endorses some subjective fever and chills at home and has had nausea and vomiting.  Also reports some shortness of breath.  Denies any substernal chest pain, arm pain, jaw pain.  Denies any changes to her bowel or bladder habits.  Denies any prior cardiac history or lung diseases, but notes she smokes. Currently on her menstrual period.    Shortness of Breath Associated symptoms: chest pain   Chest Pain Associated symptoms: shortness of breath        Home Medications Prior to Admission medications   Medication Sig Start Date End Date Taking? Authorizing Provider  ondansetron (ZOFRAN-ODT) 4 MG disintegrating tablet Take 1 tablet (4 mg total) by mouth every 8 (eight) hours as needed for up to 3 days for nausea or vomiting. 07/04/23 07/07/23 Yes Arabella Merles, PA-C  pantoprazole (PROTONIX) 40 MG tablet Take 1 tablet (40 mg total) by mouth daily. 07/04/23 08/29/23 Yes Arabella Merles, PA-C  sucralfate (CARAFATE) 1 g tablet Take 1 tablet (1 g total) by mouth 4 (four) times daily -  with meals and at bedtime. 07/04/23 08/29/23 Yes Arabella Merles, PA-C  acetaminophen (TYLENOL) 500 MG tablet Take 1,000 mg by mouth every 6 (six) hours as needed for mild pain.    [provider]  ondansetron (ZOFRAN) 4 MG tablet Take 1 tablet (4 mg total) by mouth every 6 (six) hours. 09/08/22   Gareth Eagle, PA-C  fluticasone (FLONASE) 50 MCG/ACT nasal spray Place 1 spray into both  nostrils daily. 12/13/18 07/06/20  Aviva Kluver B, PA-C  omeprazole (PRILOSEC) 20 MG capsule Take 1 capsule (20 mg total) by mouth daily. Patient not taking: Reported on 12/13/2018 01/28/17 07/06/20  Glynn Octave, MD      Allergies    Patient has no known allergies.    Review of Systems   Review of Systems  Respiratory:  Positive for shortness of breath.   Cardiovascular:  Positive for chest pain.    Physical Exam Updated Vital Signs BP 122/88 (BP Location: Left Arm)   Pulse 70   Temp 98.7 F (37.1 C) (Oral)   Resp 18   Ht 5\' 2"  (1.575 m)   Wt 63 kg   LMP 07/03/2023 (Exact Date)   SpO2 100%   BMI 25.40 kg/m  Physical Exam Vitals reviewed.  Constitutional:      General: She is not in acute distress.    Comments: Uncomfortable appearing  Cardiovascular:     Rate and Rhythm: Normal rate and regular rhythm.  Pulmonary:     Effort: Pulmonary effort is normal. No respiratory distress.     Breath sounds: Normal breath sounds.  Abdominal:     General: Abdomen is flat. Bowel sounds are normal.     Palpations: Abdomen is soft.     Comments: Right upper quadrant tenderness, positive Murphy's  Mild epigastric tenderness No TTP in the LUQ or LLQ Negative Rosving's, no McBurney's point  tenderness  Musculoskeletal:     Cervical back: Normal range of motion.  Neurological:     Mental Status: She is alert.     ED Results / Procedures / Treatments   Labs (all labs ordered are listed, but only abnormal results are displayed) Labs Reviewed  CBC WITH DIFFERENTIAL/PLATELET - Abnormal; Notable for the following components:      Result Value   Hemoglobin 10.4 (*)    HCT 32.0 (*)    RDW 23.7 (*)    All other components within normal limits  COMPREHENSIVE METABOLIC PANEL - Abnormal; Notable for the following components:   Potassium 3.0 (*)    CO2 21 (*)    Glucose, Bld 108 (*)    AST 52 (*)    ALT 48 (*)    All other components within normal limits  LIPASE, BLOOD  HCG,  QUANTITATIVE, PREGNANCY  TROPONIN I (HIGH SENSITIVITY)  TROPONIN I (HIGH SENSITIVITY)    EKG EKG Interpretation Date/Time:  Monday July 04 2023 08:19:53 EDT Ventricular Rate:  80 PR Interval:  142 QRS Duration:  95 QT Interval:  393 QTC Calculation: 454 R Axis:   87  Text Interpretation: Sinus rhythm Confirmed by Alona Bene [phone]) on 07/04/2023 8:31:27 AM  Radiology CT ABDOMEN PELVIS W CONTRAST  Result Date: 07/04/2023 CLINICAL DATA:  Abdominal pain and pressure for 3 days. EXAM: CT ABDOMEN AND PELVIS WITH CONTRAST TECHNIQUE: Multidetector CT imaging of the abdomen and pelvis was performed using the standard protocol following bolus administration of intravenous contrast. RADIATION DOSE REDUCTION: This exam was performed according to the departmental dose-optimization program which includes automated exposure control, adjustment of the mA and/or kV according to patient size and/or use of iterative reconstruction technique. CONTRAST:  OMNIPAQUE IOHEXOL 300 MG/ML  SOLN COMPARISON:  09/08/2022 FINDINGS: Lower Chest: No acute findings. Hepatobiliary: No suspicious hepatic masses identified. Moderate diffuse hepatic steatosis again demonstrated. Gallbladder is unremarkable. No evidence of biliary ductal dilatation. Pancreas:  No mass or inflammatory changes. Spleen: Within normal limits in size and appearance. Adrenals/Urinary Tract: No suspicious masses identified. No evidence of ureteral calculi or hydronephrosis. Stomach/Bowel: No evidence of obstruction, inflammatory process or abnormal fluid collections. Normal appendix visualized. Vascular/Lymphatic: No pathologically enlarged lymph nodes. No acute vascular findings. Reproductive:  No mass or other significant abnormality. Other:  None. Musculoskeletal:  No suspicious bone lesions identified. IMPRESSION: No acute findings. Moderate hepatic steatosis. Electronically Signed   By: Danae Orleans M.D.   On: 07/04/2023 11:43   US Abdomen  Limited RUQ (LIVER/GB)  Result Date: 07/04/2023 CLINICAL DATA:  Abdominal pain for 3 days EXAM: ULTRASOUND ABDOMEN LIMITED RIGHT UPPER QUADRANT COMPARISON:  CT abdomen pelvis 09/08/2022 FINDINGS: Gallbladder: Gallstones: None Sludge: None Gallbladder Wall: Within normal limits Pericholecystic fluid: None Sonographic Murphy's Sign: Negative per technologist Common bile duct: Diameter: 3 mm Liver: Parenchymal echogenicity: Diffusely increased Contours: Normal Lesions: None Portal vein: Patent.  Hepatopetal flow Other: None. IMPRESSION: 1. No acute abnormality of the gallbladder or liver. 2. Diffuse increased echogenicity of the hepatic parenchyma is a nonspecific indicator of hepatocellular dysfunction, most commonly steatosis. Electronically Signed   By: Acquanetta Belling M.D.   On: 07/04/2023 09:22   DG Chest Portable 1 View  Result Date: 07/04/2023 CLINICAL DATA:  shortness of breath EXAM: PORTABLE CHEST 1 VIEW COMPARISON:  CXR 09/17/22 FINDINGS: No pleural effusion. No pneumothorax. No focal airspace opacity. Normal cardiac and mediastinal contours. No radiographically apparent displaced rib fractures. Visualized upper abdomen is unremarkable. IMPRESSION: No focal  airspace opacity Electronically Signed   By: Lorenza Cambridge M.D.   On: 07/04/2023 09:01    Procedures Procedures    Medications Ordered in ED Medications  ondansetron (ZOFRAN) injection 4 mg (4 mg Intravenous Given 07/04/23 0858)  morphine (PF) 4 MG/ML injection 4 mg (4 mg Intravenous Given 07/04/23 0858)  sodium chloride 0.9 % bolus 1,000 mL (1,000 mLs Intravenous New Bag/Given 07/04/23 1116)  droperidol (INAPSINE) 2.5 MG/ML injection 1.25 mg (1.25 mg Intravenous Given 07/04/23 1115)  iohexol (OMNIPAQUE) 300 MG/ML solution 100 mL (100 mLs Intravenous Contrast Given 07/04/23 1123)    ED Course/ Medical Decision Making/ A&P                             Medical Decision Making Amount and/or Complexity of Data Reviewed Labs: ordered. Radiology:  ordered.  Risk Prescription drug management.   37 y.o. female with pertinent past medical history of alcohol use, smoking presents to the ED for concern of shortness of breath, chest pain, abdominal pain  Differential diagnosis includes but is not limited to ACS, PE, Pneumonia, gastroenteritis, cholangitis, appendicitis, pancreatitis  ED Course:  Patient uncomfortable appearing but in no acute distress.  Vital signs within normal limits.  She has significant right upper quadrant tenderness and positive Murphy sign.  Will obtain CBC, CMP, lipase, right upper quadrant ultrasound to evaluate for further possible biliary pathology. Less concern for appendicitis due to no RLQ abdominal pain.  Denies any cardiac history and EKG normal sinus rhythm, will obtain troponins due to concern for chest pain and shortness of breath.  Also obtain CXR. She is not tachycardic at this time with no risk factors for PE besides smoking history, will wait on further PE workup unless labs and imaging come back unremarkable. Could consider gastroenteritis given abdominal pain and nausea, however given RUQ location of pain think less likely at this time. Patient given zofran for nausea and morphine for pain Upon re-evaluation, patient reports 2 more episodes of emesis after being given Zofran, will give droperidol.  No concerning findings on chest x-ray or right upper quadrant ultrasound, labs largely within normal limits.  Will obtain CT abdomen pelvis for further evaluation 12:45PM patient reports no more pain, nausea, or vomiting. CT scan overall with no acute findings. Plan to discharge with zofran, carafate, protonix, and close follow up with PCP for GI referral  Impression: Nausea and vomiting Abdominal pain  Disposition:  The patient was discharged home with instructions to take zofran as needed for nausea and vomiting. She was started on 8 week course of carafate and protonix. She was instructed to follow up  with her PCP as soon as possible to discuss making an appointment with GI. She states she has a PCP and will follow up with them.  Return precautions given.  Lab Tests: I Ordered, and personally interpreted labs.  The pertinent results include:   CBC with hemoglobin low at 10.4 but above patient baseline of about 9.  No leukocytosis CMP with hypokalemia at 3.0, AST 52, ALT 48. Alk phos and bilirubin within normal limits Lipase within normal limits Troponin 3 Beta Hcg less than 1  Imaging Studies ordered: I ordered imaging studies including CXR, RUQ [place], CT A/P I independently visualized the imaging with scope of interpretation limited to determining acute life threatening conditions related to emergency care.  No consolidations, pneumothorax, pleural effusions Right upper quadrant ultrasound with no acute abnormalities of the gallbladder liver  CT AP without acute findings, some hepatic steatosis  I agree with the radiologist interpretation   Cardiac Monitoring: / EKG: The patient was maintained on a cardiac monitor.  I personally viewed and interpreted the cardiac monitored which showed an underlying rhythm of: NSR   Consultations Obtained: None   Co morbidities that complicate the patient evaluation  Current smoker, alcohol use  Social Determinants of Health:  unknown              Final Clinical Impression(s) / ED Diagnoses Final diagnoses:  Epigastric pain  Nausea and vomiting, unspecified vomiting type    Rx / DC Orders ED Discharge Orders          Ordered    pantoprazole (PROTONIX) 40 MG tablet  Daily        07/04/23 1246    sucralfate (CARAFATE) 1 g tablet  3 times daily with meals & bedtime        07/04/23 1246    ondansetron (ZOFRAN-ODT) 4 MG disintegrating tablet  Every 8 hours PRN        07/04/23 1246              Arabella Merles, PA-C 07/04/23 1305    Maia Plan, MD 07/05/23 [phone]

## 2023-09-04 ENCOUNTER — Other Ambulatory Visit: Payer: Self-pay

## 2023-09-04 ENCOUNTER — Emergency Department (HOSPITAL_COMMUNITY): Payer: No Typology Code available for payment source

## 2023-09-04 ENCOUNTER — Emergency Department (HOSPITAL_COMMUNITY)
Admission: EM | Admit: 2023-09-04 | Discharge: 2023-09-05 | Disposition: A | Discharge status: Home / Self Care | Payer: No Typology Code available for payment source | Attending: Emergency Medicine | Admitting: Emergency Medicine

## 2023-09-04 ENCOUNTER — Encounter (HOSPITAL_COMMUNITY): Payer: Self-pay | Admitting: Emergency Medicine

## 2023-09-04 DIAGNOSIS — B349 Viral infection, unspecified: Secondary | ICD-10-CM | POA: Insufficient documentation

## 2023-09-04 DIAGNOSIS — Z20822 Contact with and (suspected) exposure to covid-19: Secondary | ICD-10-CM | POA: Insufficient documentation

## 2023-09-04 DIAGNOSIS — R079 Chest pain, unspecified: Secondary | ICD-10-CM | POA: Diagnosis not present

## 2023-09-04 DIAGNOSIS — B3781 Candidal esophagitis: Secondary | ICD-10-CM | POA: Diagnosis not present

## 2023-09-04 DIAGNOSIS — R9431 Abnormal electrocardiogram [ECG] [EKG]: Secondary | ICD-10-CM | POA: Diagnosis not present

## 2023-09-04 DIAGNOSIS — E876 Hypokalemia: Secondary | ICD-10-CM | POA: Insufficient documentation

## 2023-09-04 DIAGNOSIS — D649 Anemia, unspecified: Secondary | ICD-10-CM | POA: Insufficient documentation

## 2023-09-04 DIAGNOSIS — R051 Acute cough: Secondary | ICD-10-CM

## 2023-09-04 LAB — CBC
HCT: 32.5 % — ABNORMAL LOW (ref 36.0–46.0)
Hemoglobin: 10.3 g/dL — ABNORMAL LOW (ref 12.0–15.0)
MCH: 25.9 pg — ABNORMAL LOW (ref 26.0–34.0)
MCHC: 31.7 g/dL (ref 30.0–36.0)
MCV: 81.9 fL (ref 80.0–100.0)
Platelets: 298 10*3/uL (ref 150–400)
RBC: 3.97 MIL/uL (ref 3.87–5.11)
RDW: 20.6 % — ABNORMAL HIGH (ref 11.5–15.5)
WBC: 4.1 10*3/uL (ref 4.0–10.5)
nRBC: 0 % (ref 0.0–0.2)

## 2023-09-04 LAB — TROPONIN I (HIGH SENSITIVITY): Troponin I (High Sensitivity): 3 ng/L (ref <18)

## 2023-09-04 LAB — BASIC METABOLIC PANEL
Anion gap: 18 — ABNORMAL HIGH (ref 5–15)
BUN: 10 mg/dL (ref 6–20)
CO2: 18 mmol/L — ABNORMAL LOW (ref 22–32)
Calcium: 9.2 mg/dL (ref 8.9–10.3)
Chloride: 98 mmol/L (ref 98–111)
Creatinine, Ser: 0.66 mg/dL (ref 0.44–1.00)
GFR, Estimated: >60 mL/min (ref >60)
Glucose, Bld: 85 mg/dL (ref 70–99)
Potassium: 3.2 mmol/L — ABNORMAL LOW (ref 3.5–5.1)
Sodium: 134 mmol/L — ABNORMAL LOW (ref 135–145)

## 2023-09-04 LAB — HCG, SERUM, QUALITATIVE: Preg, Serum: NEGATIVE

## 2023-09-04 MED ORDER — ONDANSETRON 8 MG PO TBDP
8.0000 mg | ORAL_TABLET | Freq: Once | ORAL | Status: AC
  Administered 2023-09-04: 8 mg via ORAL
  Filled 2023-09-04: qty 1

## 2023-09-04 MED ORDER — ACETAMINOPHEN 500 MG PO TABS
1000.0000 mg | ORAL_TABLET | Freq: Once | ORAL | Status: AC
  Administered 2023-09-04: 1000 mg via ORAL
  Filled 2023-09-04: qty 2

## 2023-09-04 NOTE — ED Triage Notes (Signed)
Pt c/o lower chest and upper abdominal pain with cough that started last night. Pt c/o burning in her throat as well.

## 2023-09-05 LAB — SARS CORONAVIRUS 2 BY RT PCR: SARS Coronavirus 2 by RT PCR: NEGATIVE

## 2023-09-05 MED ORDER — IBUPROFEN 800 MG PO TABS: 800.0000 mg | ORAL_TABLET | Freq: Three times a day (TID) | ORAL | 0 refills | Status: DC

## 2023-09-05 MED ORDER — HALOPERIDOL LACTATE 5 MG/ML IJ SOLN
5.0000 mg | Freq: Once | INTRAMUSCULAR | Status: AC
  Administered 2023-09-05: 5 mg via INTRAMUSCULAR
  Filled 2023-09-05: qty 1

## 2023-09-05 MED ORDER — BENZONATATE 100 MG PO CAPS: 100.0000 mg | ORAL_CAPSULE | Freq: Three times a day (TID) | ORAL | 0 refills | Status: DC

## 2023-09-05 NOTE — ED Provider Notes (Signed)
[place] EMERGENCY DEPARTMENT AT [facility] Provider Note   CSN: [id number] Arrival date & time: 09/04/23  2151     History  Chief Complaint  Patient presents with   Chest Pain    [name] is a 37 y.o. female.  The history is provided by the patient.  Chest Pain Pain location:  Substernal area Pain quality: aching   Pain quality comment:  With coughing Pain radiates to:  Does not radiate Pain severity:  Moderate Timing:  Constant Progression:  Unchanged Chronicity:  New Context: not breathing   Context comment:  Cough Relieved by:  Nothing Worsened by:  Nothing Ineffective treatments:  None tried Associated symptoms: cough, nausea and vomiting   Associated symptoms: no altered mental status, no anorexia, no anxiety, no back pain, no claudication, no diaphoresis, no fever and no shortness of breath   Risk factors: no high cholesterol and no hypertension   Patient with congestion and cough and nausea and vomiting.       Home Medications Prior to Admission medications   Medication Sig Start Date End Date Taking? Authorizing Provider  benzonatate (TESSALON) 100 MG capsule Take 1 capsule (100 mg total) by mouth every 8 (eight) hours. 09/05/23  Yes Maygan Koeller, MD  acetaminophen (TYLENOL) 500 MG tablet Take 1,000 mg by mouth every 6 (six) hours as needed for mild pain.    [provider]  ondansetron (ZOFRAN) 4 MG tablet Take 1 tablet (4 mg total) by mouth every 6 (six) hours. 09/08/22   Gareth Eagle, PA-C  pantoprazole (PROTONIX) 40 MG tablet Take 1 tablet (40 mg total) by mouth daily. 07/04/23 08/29/23  Arabella Merles, PA-C  sucralfate (CARAFATE) 1 g tablet Take 1 tablet (1 g total) by mouth 4 (four) times daily -  with meals and at bedtime. 07/04/23 08/29/23  Arabella Merles, PA-C  fluticasone (FLONASE) 50 MCG/ACT nasal spray Place 1 spray into both nostrils daily. 12/13/18 07/06/20  Aviva Kluver B, PA-C  omeprazole (PRILOSEC) 20 MG  capsule Take 1 capsule (20 mg total) by mouth daily. Patient not taking: Reported on 12/13/2018 01/28/17 07/06/20  Glynn Octave, MD      Allergies    Patient has no known allergies.    Review of Systems   Review of Systems  Constitutional:  Negative for diaphoresis and fever.  HENT:  Positive for congestion. Negative for facial swelling.   Respiratory:  Positive for cough. Negative for shortness of breath, wheezing and stridor.   Cardiovascular:  Positive for chest pain. Negative for claudication and leg swelling.  Gastrointestinal:  Positive for nausea and vomiting. Negative for anorexia and diarrhea.  Musculoskeletal:  Negative for back pain.  All other systems reviewed and are negative.   Physical Exam Updated Vital Signs BP (!) 152/112 (BP Location: Left Arm)   Pulse 97   Temp 99 F (37.2 C)   Resp 16   Ht 5\' 2"  (1.575 m)   Wt 63 kg   LMP 08/27/2023   SpO2 97%   BMI 25.40 kg/m  Physical Exam Vitals and nursing note reviewed.  Constitutional:      General: She is not in acute distress.    Appearance: Normal appearance. She is well-developed.     Comments: Coughing in room, spitting up as well   HENT:     Head: Normocephalic and atraumatic.     Nose: Nose normal.     Mouth/Throat:     Mouth: Mucous membranes are moist.  Pharynx: Oropharynx is clear.  Eyes:     Pupils: Pupils are equal, round, and reactive to light.  Cardiovascular:     Rate and Rhythm: Normal rate and regular rhythm.     Pulses: Normal pulses.     Heart sounds: Normal heart sounds.  Pulmonary:     Effort: Pulmonary effort is normal. No respiratory distress.     Breath sounds: Normal breath sounds. No stridor. No wheezing, rhonchi or rales.  Abdominal:     General: Bowel sounds are normal. There is no distension.     Palpations: Abdomen is soft. There is no mass.     Tenderness: There is no abdominal tenderness. There is no guarding or rebound.     Hernia: No hernia is present.   Musculoskeletal:        General: Normal range of motion.     Cervical back: Normal range of motion and neck supple.  Skin:    General: Skin is dry.     Capillary Refill: Capillary refill takes less than 2 seconds.     Findings: No erythema or rash.  Neurological:     General: No focal deficit present.     Mental Status: She is alert.     Deep Tendon Reflexes: Reflexes normal.  Psychiatric:        Mood and Affect: Mood normal.     ED Results / Procedures / Treatments   Labs (all labs ordered are listed, but only abnormal results are displayed) Results for orders placed or performed during the hospital encounter of 09/04/23  SARS Coronavirus 2 by RT PCR (hospital order, performed in [facility] hospital lab) *cepheid single result test* Anterior Nasal Swab   Specimen: Anterior Nasal Swab  Result Value Ref Range   SARS Coronavirus 2 by RT PCR NEGATIVE NEGATIVE  Basic metabolic panel  Result Value Ref Range   Sodium 134 (L) 135 - 145 mmol/L   Potassium 3.2 (L) 3.5 - 5.1 mmol/L   Chloride 98 98 - 111 mmol/L   CO2 18 (L) 22 - 32 mmol/L   Glucose, Bld 85 70 - 99 mg/dL   BUN 10 6 - 20 mg/dL   Creatinine, Ser 1.61 0.44 - 1.00 mg/dL   Calcium 9.2 8.9 - 09.6 mg/dL   GFR, Estimated >04 >54 mL/min   Anion gap 18 (H) 5 - 15  CBC  Result Value Ref Range   WBC 4.1 4.0 - 10.5 K/uL   RBC 3.97 3.87 - 5.11 MIL/uL   Hemoglobin 10.3 (L) 12.0 - 15.0 g/dL   HCT 09.8 (L) 11.9 - 14.7 %   MCV 81.9 80.0 - 100.0 fL   MCH 25.9 (L) 26.0 - 34.0 pg   MCHC 31.7 30.0 - 36.0 g/dL   RDW 82.9 (H) 56.2 - 13.0 %   Platelets 298 150 - 400 K/uL   nRBC 0.0 0.0 - 0.2 %  hCG, serum, qualitative  Result Value Ref Range   Preg, Serum NEGATIVE NEGATIVE  Troponin I (High Sensitivity)  Result Value Ref Range   Troponin I (High Sensitivity) 3 <18 ng/L   DG Chest 2 View  Result Date: 09/04/2023 CLINICAL DATA:  Chest pain EXAM: CHEST - 2 VIEW COMPARISON:  Chest x-ray 07/04/2023 FINDINGS: The heart size and  mediastinal contours are within normal limits. Both lungs are clear. The visualized skeletal structures are unremarkable. IMPRESSION: No active cardiopulmonary disease. Electronically Signed   By: Darliss Cheney M.D.   On: 09/04/2023 22:44  EKG EKG Interpretation Date/Time:  Sunday September 04 2023 22:01:33 EDT Ventricular Rate:  88 PR Interval:  137 QRS Duration:  96 QT Interval:  385 QTC Calculation: 466 R Axis:   98  Text Interpretation: Sinus rhythm Confirmed by Wanda Cellucci ([id number]) on 09/04/2023 11:12:23 PM  Radiology DG Chest 2 View  Result Date: 09/04/2023 CLINICAL DATA:  Chest pain EXAM: CHEST - 2 VIEW COMPARISON:  Chest x-ray 07/04/2023 FINDINGS: The heart size and mediastinal contours are within normal limits. Both lungs are clear. The visualized skeletal structures are unremarkable. IMPRESSION: No active cardiopulmonary disease. Electronically Signed   By: Darliss Cheney M.D.   On: 09/04/2023 22:44    Procedures Procedures    Medications Ordered in ED Medications  acetaminophen (TYLENOL) tablet 1,000 mg (1,000 mg Oral Given 09/04/23 2338)  ondansetron (ZOFRAN-ODT) disintegrating tablet 8 mg (8 mg Oral Given 09/04/23 2344)  haloperidol lactate (HALDOL) injection 5 mg (5 mg Intramuscular Given 09/05/23 0109)    ED Course/ Medical Decision Making/ A&P                                 Medical Decision Making Patient with viral illness with cough and vomiting and pain with cough.  Other family members have been sick recently   Amount and/or Complexity of Data Reviewed Independent Historian: spouse    Details: See above  External Data Reviewed: notes.    Details: Previous notes reviewed  Labs: ordered.    Details: Negative covid, negative pregnancy test.  Troponin is negative at 3.  Sodium 134, potassium slight low 3.2, normal creatinine.  Normal white count 4.1, slightly low hemoglobin  10.3, normal platelets  Radiology: ordered and independent interpretation performed.     Details: Negative by me  ECG/medicine tests: ordered and independent interpretation performed. Decision-making details documented in ED Course.  Risk OTC drugs. Prescription drug management. Risk Details: Well appearing with normal exam.  Still nauseated post zofran.  Haldol provided without further emesis.  Ruled out for MI based on time course.  Lungs are clear I do not believe this is PNA with normal exam and CXR.  Wells score negative.  I do not believe this is a PE.  Stable for discharge.  Tessalon for cough and ibuprofen for pain and fever control.  Strict return.      Final Clinical Impression(s) / ED Diagnoses Final diagnoses:  Viral illness  Acute cough   Return for intractable cough, coughing up blood, fevers > 100.4 unrelieved by medication, shortness of breath, intractable vomiting, chest pain, shortness of breath, weakness, numbness, changes in speech, facial asymmetry, abdominal pain, passing out, Inability to tolerate liquids or food, cough, altered mental status or any concerns. No signs of systemic illness or infection. The patient is nontoxic-appearing on exam and vital signs are within normal limits.  I have reviewed the triage vital signs and the nursing notes. Pertinent labs & imaging results that were available during my care of the patient were reviewed by me and considered in my medical decision making (see chart for details). After history, exam, and medical workup I feel the patient has been appropriately medically screened and is safe for discharge home. Pertinent diagnoses were discussed with the patient. Patient was given return precautions.    Rx / DC Orders ED Discharge Orders          Ordered    benzonatate (TESSALON) 100 MG capsule  Every 8  hours        09/05/23 0149              Shenouda Genova, MD 09/05/23 0157

## 2023-09-07 ENCOUNTER — Inpatient Hospital Stay (HOSPITAL_COMMUNITY)
Admission: EM | Admit: 2023-09-07 | Discharge: 2023-09-11 | DRG: 369 | Disposition: A | Discharge status: Home / Self Care | Payer: No Typology Code available for payment source | Attending: Internal Medicine | Admitting: Internal Medicine

## 2023-09-07 ENCOUNTER — Encounter (HOSPITAL_COMMUNITY): Payer: Self-pay

## 2023-09-07 ENCOUNTER — Other Ambulatory Visit: Payer: Self-pay

## 2023-09-07 DIAGNOSIS — N390 Urinary tract infection, site not specified: Secondary | ICD-10-CM | POA: Diagnosis present

## 2023-09-07 DIAGNOSIS — R9431 Abnormal electrocardiogram [ECG] [EKG]: Secondary | ICD-10-CM | POA: Diagnosis not present

## 2023-09-07 DIAGNOSIS — R7401 Elevation of levels of liver transaminase levels: Secondary | ICD-10-CM | POA: Diagnosis present

## 2023-09-07 DIAGNOSIS — R059 Cough, unspecified: Secondary | ICD-10-CM

## 2023-09-07 DIAGNOSIS — Z833 Family history of diabetes mellitus: Secondary | ICD-10-CM

## 2023-09-07 DIAGNOSIS — R112 Nausea with vomiting, unspecified: Secondary | ICD-10-CM | POA: Diagnosis present

## 2023-09-07 DIAGNOSIS — B3781 Candidal esophagitis: Principal | ICD-10-CM | POA: Diagnosis present

## 2023-09-07 DIAGNOSIS — Z79899 Other long term (current) drug therapy: Secondary | ICD-10-CM

## 2023-09-07 DIAGNOSIS — F17210 Nicotine dependence, cigarettes, uncomplicated: Secondary | ICD-10-CM | POA: Diagnosis present

## 2023-09-07 DIAGNOSIS — E876 Hypokalemia: Secondary | ICD-10-CM | POA: Diagnosis present

## 2023-09-07 DIAGNOSIS — Z8249 Family history of ischemic heart disease and other diseases of the circulatory system: Secondary | ICD-10-CM

## 2023-09-07 DIAGNOSIS — E878 Other disorders of electrolyte and fluid balance, not elsewhere classified: Secondary | ICD-10-CM | POA: Diagnosis present

## 2023-09-07 DIAGNOSIS — E871 Hypo-osmolality and hyponatremia: Secondary | ICD-10-CM | POA: Diagnosis present

## 2023-09-07 DIAGNOSIS — F1290 Cannabis use, unspecified, uncomplicated: Secondary | ICD-10-CM | POA: Diagnosis present

## 2023-09-07 DIAGNOSIS — K2900 Acute gastritis without bleeding: Secondary | ICD-10-CM | POA: Diagnosis present

## 2023-09-07 DIAGNOSIS — D649 Anemia, unspecified: Secondary | ICD-10-CM | POA: Diagnosis present

## 2023-09-07 DIAGNOSIS — K219 Gastro-esophageal reflux disease without esophagitis: Secondary | ICD-10-CM | POA: Diagnosis present

## 2023-09-07 DIAGNOSIS — F1011 Alcohol abuse, in remission: Secondary | ICD-10-CM

## 2023-09-07 DIAGNOSIS — G40909 Epilepsy, unspecified, not intractable, without status epilepticus: Secondary | ICD-10-CM | POA: Diagnosis present

## 2023-09-07 DIAGNOSIS — Z1152 Encounter for screening for COVID-19: Secondary | ICD-10-CM

## 2023-09-07 DIAGNOSIS — N393 Stress incontinence (female) (male): Secondary | ICD-10-CM | POA: Diagnosis present

## 2023-09-07 DIAGNOSIS — F1210 Cannabis abuse, uncomplicated: Secondary | ICD-10-CM | POA: Diagnosis present

## 2023-09-07 DIAGNOSIS — R739 Hyperglycemia, unspecified: Secondary | ICD-10-CM

## 2023-09-07 DIAGNOSIS — R1013 Epigastric pain: Secondary | ICD-10-CM | POA: Insufficient documentation

## 2023-09-07 LAB — CBC
HCT: 38 % (ref 36.0–46.0)
Hemoglobin: 12.1 g/dL (ref 12.0–15.0)
MCH: 25.3 pg — ABNORMAL LOW (ref 26.0–34.0)
MCHC: 31.8 g/dL (ref 30.0–36.0)
MCV: 79.5 fL — ABNORMAL LOW (ref 80.0–100.0)
Platelets: 378 10*3/uL (ref 150–400)
RBC: 4.78 MIL/uL (ref 3.87–5.11)
RDW: 20 % — ABNORMAL HIGH (ref 11.5–15.5)
WBC: 9.3 10*3/uL (ref 4.0–10.5)
nRBC: 0 % (ref 0.0–0.2)

## 2023-09-07 LAB — LIPASE, BLOOD: Lipase: 20 U/L (ref 11–51)

## 2023-09-07 LAB — COMPREHENSIVE METABOLIC PANEL
ALT: 51 U/L — ABNORMAL HIGH (ref 0–44)
AST: 47 U/L — ABNORMAL HIGH (ref 15–41)
Albumin: 4.9 g/dL (ref 3.5–5.0)
Alkaline Phosphatase: 86 U/L (ref 38–126)
Anion gap: 15 (ref 5–15)
BUN: 10 mg/dL (ref 6–20)
CO2: 26 mmol/L (ref 22–32)
Calcium: 9.9 mg/dL (ref 8.9–10.3)
Chloride: 90 mmol/L — ABNORMAL LOW (ref 98–111)
Creatinine, Ser: 0.78 mg/dL (ref 0.44–1.00)
GFR, Estimated: >60 mL/min (ref >60)
Glucose, Bld: 175 mg/dL — ABNORMAL HIGH (ref 70–99)
Potassium: 2.7 mmol/L — CL (ref 3.5–5.1)
Sodium: 131 mmol/L — ABNORMAL LOW (ref 135–145)
Total Bilirubin: 0.7 mg/dL (ref 0.3–1.2)
Total Protein: 9.7 g/dL — ABNORMAL HIGH (ref 6.5–8.1)

## 2023-09-07 LAB — URINALYSIS, W/ REFLEX TO CULTURE (INFECTION SUSPECTED)
Bilirubin Urine: NEGATIVE
Glucose, UA: NEGATIVE mg/dL
Hgb urine dipstick: NEGATIVE
Ketones, ur: 5 mg/dL — AB
Nitrite: POSITIVE — AB
Protein, ur: 30 mg/dL — AB
Specific Gravity, Urine: 1.018 (ref 1.005–1.030)
pH: 7 (ref 5.0–8.0)

## 2023-09-07 LAB — URINALYSIS, ROUTINE W REFLEX MICROSCOPIC
Glucose, UA: NEGATIVE mg/dL
Ketones, ur: 20 mg/dL — AB
Nitrite: POSITIVE — AB
Protein, ur: 100 mg/dL — AB
Specific Gravity, Urine: 1.027 (ref 1.005–1.030)
WBC, UA: >50 WBC/hpf (ref 0–5)
pH: 5 (ref 5.0–8.0)

## 2023-09-07 LAB — RAPID URINE DRUG SCREEN, HOSP PERFORMED
Amphetamines: NOT DETECTED
Barbiturates: NOT DETECTED
Benzodiazepines: NOT DETECTED
Cocaine: NOT DETECTED
Opiates: POSITIVE — AB
Tetrahydrocannabinol: POSITIVE — AB

## 2023-09-07 LAB — CBG MONITORING, ED: Glucose-Capillary: 92 mg/dL (ref 70–99)

## 2023-09-07 LAB — HCG, SERUM, QUALITATIVE: Preg, Serum: NEGATIVE

## 2023-09-07 LAB — MAGNESIUM: Magnesium: 2 mg/dL (ref 1.7–2.4)

## 2023-09-07 MED ORDER — ACETAMINOPHEN 325 MG PO TABS
650.0000 mg | ORAL_TABLET | Freq: Four times a day (QID) | ORAL | Status: DC | PRN
  Administered 2023-09-08: 650 mg via ORAL
  Filled 2023-09-07: qty 2

## 2023-09-07 MED ORDER — METOCLOPRAMIDE HCL 5 MG/ML IJ SOLN
10.0000 mg | Freq: Once | INTRAMUSCULAR | Status: AC
  Administered 2023-09-07: 10 mg via INTRAVENOUS
  Filled 2023-09-07: qty 2

## 2023-09-07 MED ORDER — ACETAMINOPHEN 650 MG RE SUPP: 650.0000 mg | Freq: Four times a day (QID) | RECTAL | Status: DC | PRN

## 2023-09-07 MED ORDER — NALOXONE HCL 0.4 MG/ML IJ SOLN: 0.4000 mg | INTRAMUSCULAR | Status: DC | PRN

## 2023-09-07 MED ORDER — SODIUM CHLORIDE 0.9 % IV SOLN: INTRAVENOUS | Status: DC

## 2023-09-07 MED ORDER — SODIUM CHLORIDE 0.9 % IV SOLN
1.0000 g | INTRAVENOUS | Status: DC
  Administered 2023-09-07 – 2023-09-09 (×3): 1 g via INTRAVENOUS
  Filled 2023-09-07 (×3): qty 10

## 2023-09-07 MED ORDER — POTASSIUM CHLORIDE CRYS ER 20 MEQ PO TBCR
40.0000 meq | EXTENDED_RELEASE_TABLET | Freq: Once | ORAL | Status: AC
  Administered 2023-09-07: 40 meq via ORAL
  Filled 2023-09-07: qty 2

## 2023-09-07 MED ORDER — PANTOPRAZOLE SODIUM 40 MG IV SOLR
40.0000 mg | Freq: Two times a day (BID) | INTRAVENOUS | Status: DC
  Administered 2023-09-07 – 2023-09-11 (×8): 40 mg via INTRAVENOUS
  Filled 2023-09-07 (×8): qty 10

## 2023-09-07 MED ORDER — POTASSIUM CHLORIDE 10 MEQ/100ML IV SOLN
10.0000 meq | INTRAVENOUS | Status: AC
  Administered 2023-09-07 (×2): 10 meq via INTRAVENOUS
  Filled 2023-09-07 (×2): qty 100

## 2023-09-07 MED ORDER — TRIMETHOBENZAMIDE HCL 100 MG/ML IM SOLN
200.0000 mg | Freq: Three times a day (TID) | INTRAMUSCULAR | Status: DC | PRN
  Administered 2023-09-08: 200 mg via INTRAMUSCULAR
  Filled 2023-09-07 (×2): qty 2

## 2023-09-07 MED ORDER — MORPHINE SULFATE (PF) 2 MG/ML IV SOLN
1.0000 mg | INTRAVENOUS | Status: DC | PRN
  Administered 2023-09-08 – 2023-09-09 (×4): 1 mg via INTRAVENOUS
  Filled 2023-09-07 (×4): qty 1

## 2023-09-07 MED ORDER — GUAIFENESIN-DM 100-10 MG/5ML PO SYRP: 5.0000 mL | ORAL_SOLUTION | ORAL | Status: DC | PRN

## 2023-09-07 MED ORDER — SODIUM CHLORIDE 0.9 % IV SOLN
12.5000 mg | Freq: Four times a day (QID) | INTRAVENOUS | Status: DC | PRN
  Administered 2023-09-07: 12.5 mg via INTRAVENOUS
  Filled 2023-09-07: qty 12.5

## 2023-09-07 MED ORDER — INSULIN ASPART 100 UNIT/ML IJ SOLN
0.0000 [IU] | INTRAMUSCULAR | Status: DC
  Filled 2023-09-07: qty 0.06

## 2023-09-07 MED ORDER — DROPERIDOL 2.5 MG/ML IJ SOLN: 2.5000 mg | Freq: Once | INTRAMUSCULAR | Status: DC

## 2023-09-07 MED ORDER — MORPHINE SULFATE (PF) 4 MG/ML IV SOLN
4.0000 mg | Freq: Once | INTRAVENOUS | Status: AC
  Administered 2023-09-07: 4 mg via INTRAVENOUS
  Filled 2023-09-07: qty 1

## 2023-09-07 MED ORDER — POTASSIUM CHLORIDE 10 MEQ/100ML IV SOLN
10.0000 meq | INTRAVENOUS | Status: AC
  Administered 2023-09-07 – 2023-09-08 (×4): 10 meq via INTRAVENOUS
  Filled 2023-09-07 (×3): qty 100

## 2023-09-07 MED ORDER — SODIUM CHLORIDE 0.9 % IV BOLUS
2000.0000 mL | Freq: Once | INTRAVENOUS | Status: AC
  Administered 2023-09-07: 2000 mL via INTRAVENOUS

## 2023-09-07 NOTE — ED Provider Notes (Signed)
EMERGENCY DEPARTMENT AT [facility] Provider Note   CSN: [id number] Arrival date & time: 09/07/23  1409     History  Chief Complaint  Patient presents with   Abdominal Pain   Emesis    [name] is a 37 y.o. female.  With a history of alcohol dependence, anemia, gastric ulcers, seizure disorder, marijuana abuse presenting for evaluation of epigastric abdominal pain.  She states she developed a viral illness a few days ago.  She has been unable to tolerate anything by mouth since her symptoms began.  She states each time she attempts to eat or drink anything she vomits immediately afterwards.  She also has some episodes of posttussive emesis.  She states her cough has gotten significantly better since her symptoms began but she was still unable to eat or drink.  She feels like weaker than normal right now.  She denies any fevers or chills.  No chest pain or shortness of breath.  Cough is nonproductive.  She smokes marijuana daily.  She reports social alcohol use to me, 2-3 times per week.  She denies any other recreational drug use.  Reports her urine is darker than normal but denies any urinary symptoms.  No diarrhea.  No hematemesis.   Abdominal Pain Associated symptoms: nausea and vomiting   Emesis Associated symptoms: abdominal pain        Home Medications Prior to Admission medications   Medication Sig Start Date End Date Taking? Authorizing Provider  acetaminophen (TYLENOL) 500 MG tablet Take 1,000 mg by mouth every 6 (six) hours as needed for mild pain.    [provider]  benzonatate (TESSALON) 100 MG capsule Take 1 capsule (100 mg total) by mouth every 8 (eight) hours. 09/05/23   Palumbo, April, MD  ibuprofen (ADVIL) 800 MG tablet Take 1 tablet (800 mg total) by mouth 3 (three) times daily. 09/05/23   Palumbo, April, MD  ondansetron (ZOFRAN) 4 MG tablet Take 1 tablet (4 mg total) by mouth every 6 (six) hours. 09/08/22   Gareth Eagle, PA-C   pantoprazole (PROTONIX) 40 MG tablet Take 1 tablet (40 mg total) by mouth daily. 07/04/23 08/29/23  Arabella Merles, PA-C  sucralfate (CARAFATE) 1 g tablet Take 1 tablet (1 g total) by mouth 4 (four) times daily -  with meals and at bedtime. 07/04/23 08/29/23  Arabella Merles, PA-C  fluticasone (FLONASE) 50 MCG/ACT nasal spray Place 1 spray into both nostrils daily. 12/13/18 07/06/20  Aviva Kluver B, PA-C  omeprazole (PRILOSEC) 20 MG capsule Take 1 capsule (20 mg total) by mouth daily. Patient not taking: Reported on 12/13/2018 01/28/17 07/06/20  Glynn Octave, MD      Allergies    Patient has no known allergies.    Review of Systems   Review of Systems  Gastrointestinal:  Positive for abdominal pain, nausea and vomiting.  All other systems reviewed and are negative.   Physical Exam Updated Vital Signs BP (!) 140/83 (BP Location: Right Arm)   Pulse 67   Temp 99.4 F (37.4 C) (Oral)   Resp 17   Ht 5\' 2"  (1.575 m)   Wt 63 kg   LMP 08/27/2023   SpO2 100%   BMI 25.40 kg/m  Physical Exam Vitals and nursing note reviewed.  Constitutional:      General: She is not in acute distress.    Appearance: She is well-developed.     Comments: Resting comfortably in bed  HENT:     Head:  Normocephalic and atraumatic.  Eyes:     Conjunctiva/sclera: Conjunctivae normal.  Cardiovascular:     Rate and Rhythm: Normal rate and regular rhythm.     Heart sounds: No murmur heard. Pulmonary:     Effort: Pulmonary effort is normal. No respiratory distress.     Breath sounds: Normal breath sounds. No wheezing, rhonchi or rales.  Abdominal:     General: Abdomen is flat. Bowel sounds are normal.     Palpations: Abdomen is soft.     Tenderness: There is no abdominal tenderness. There is no guarding or rebound. Negative signs include McBurney's sign.  Musculoskeletal:        General: No swelling.     Cervical back: Neck supple.  Skin:    General: Skin is warm and dry.     Capillary Refill:  Capillary refill takes less than 2 seconds.  Neurological:     Mental Status: She is alert.  Psychiatric:        Mood and Affect: Mood normal.     ED Results / Procedures / Treatments   Labs (all labs ordered are listed, but only abnormal results are displayed) Labs Reviewed  COMPREHENSIVE METABOLIC PANEL - Abnormal; Notable for the following components:      Result Value   Sodium 131 (*)    Potassium 2.7 (*)    Chloride 90 (*)    Glucose, Bld 175 (*)    Total Protein 9.7 (*)    AST 47 (*)    ALT 51 (*)    All other components within normal limits  CBC - Abnormal; Notable for the following components:   MCV 79.5 (*)    MCH 25.3 (*)    RDW 20.0 (*)    All other components within normal limits  URINALYSIS, ROUTINE W REFLEX MICROSCOPIC - Abnormal; Notable for the following components:   Color, Urine AMBER (*)    APPearance CLOUDY (*)    Hgb urine dipstick SMALL (*)    Bilirubin Urine SMALL (*)    Ketones, ur 20 (*)    Protein, ur 100 (*)    Nitrite POSITIVE (*)    Leukocytes,Ua MODERATE (*)    Bacteria, UA RARE (*)    All other components within normal limits  LIPASE, BLOOD  HCG, SERUM, QUALITATIVE  MAGNESIUM  URINALYSIS, W/ REFLEX TO CULTURE (INFECTION SUSPECTED)  RAPID URINE DRUG SCREEN, HOSP PERFORMED    EKG EKG Interpretation Date/Time:  Wednesday September 07 2023 14:21:29 EDT Ventricular Rate:  82 PR Interval:  134 QRS Duration:  84 QT Interval:  462 QTC Calculation: 540 R Axis:   75  Text Interpretation: Sinus arrhythmia Ventricular premature complex Right atrial enlargement Prolonged QT interval Confirmed by Vivi Barrack [phone]) on 09/07/2023 5:08:57 PM  Radiology No results found.  Procedures Procedures    Medications Ordered in ED Medications  promethazine (PHENERGAN) 12.5 mg in sodium chloride 0.9 % 50 mL IVPB (0 mg Intravenous Stopped 09/07/23 1654)  sodium chloride 0.9 % bolus 2,000 mL (0 mLs Intravenous Stopped 09/07/23 1802)  potassium  chloride 10 mEq in 100 mL IVPB (0 mEq Intravenous Stopped 09/07/23 1802)  morphine (PF) 4 MG/ML injection 4 mg (4 mg Intravenous Given 09/07/23 1554)  potassium chloride SA (KLOR-CON M) CR tablet 40 mEq (40 mEq Oral Given 09/07/23 1856)  metoCLOPramide (REGLAN) injection 10 mg (10 mg Intravenous Given 09/07/23 1931)  morphine (PF) 4 MG/ML injection 4 mg (4 mg Intravenous Given 09/07/23 1933)    ED Course/ Medical  Decision Making/ A&P Clinical Course as of 09/07/23 1959  Wed Sep 07, 2023  1725 Patient states symptoms have significantly improved [AS]  1924 Patient vomiting again, abdominal pain has returned. Will re dose medication and admit [AS]  1952 Spoke with hospitalist Dr. Loney Loh who will admit [AS]    Clinical Course User Index [AS] Ellianne Gowen, Edsel Petrin, PA-C                                 Medical Decision Making Amount and/or Complexity of Data Reviewed Labs: ordered.  Risk Prescription drug management. Decision regarding hospitalization.   This patient presents to the ED for concern of abdominal pain, this involves an extensive number of treatment options, and is a complaint that carries with it a high risk of complications and morbidity. Differential diagnosis of epigastric pain includes: Functional or nonulcer dyspepsia, PUD, GERD, Gastritis, (NSAIDs, alcohol, stress, H. pylori, pernicious anemia), pancreatitis or pancreatic cancer, overeating indigestion (high-fat foods, coffee), drugs (aspirin, antibiotics (eg, macrolides, metronidazole), corticosteroids, digoxin, narcotics, theophylline), gastroparesis, lactose intolerance, malabsorption gastric cancer, parasitic infection, (Giardia, Strongyloides, Ascaris) cholelithiasis, choledocholithiasis, or cholangitis, ACS, pericarditis, pneumonia, abdominal hernia, pregnancy, intestinal ischemia, esophageal rupture, gastric volvulus, hepatitis.   Co morbidities that complicate the patient evaluation  alcohol dependence, anemia,  gastric ulcers, seizure disorder, marijuana abuse  My initial workup includes labs, symptom control, EKG  Additional history obtained from: Nursing notes from this visit. Previous records within EMR system.  ED visit on 09/04/2023 for similar  I ordered, reviewed and interpreted labs which include: CMP, CBC, lipase, urinalysis, hCG, magnesium.  Hyponatremia 131, hypokalemia of 2.7, hypochloremia of 90, AST and ALT slightly elevated at 47 and 51 respectively  Consultations Obtained:  I requested consultation with the hospitalist Dr. Loney Loh,  and discussed lab and imaging findings as well as pertinent plan - they recommend: admit  Afebrile, hemodynamically stable.  37 year old female presenting to the ED for evaluation of epigastric abdominal pain, nausea, vomiting.  She has been unable to tolerate p.o. intake for the past 3 days.  She appears well on physical exam.  Mild epigastric abdominal TTP without rebound tenderness, rigidity, guarding.  Lab workup significant for hyponatremia of 131, hypokalemia 2.7, hypochloremia of 90, mild elevation to LFTs.  EKG with new onset QT prolongation.  Had a shared decision-making conversation with the patient.  Initially she preferred to be discharged home and presented to improved, however she then failed her p.o. trial and would prefer to be admitted.  Believe this is reasonable given her electrolyte abnormalities, EKG change, inability to tolerate p.o.  Will be admitted to hospitalist service. Stable at the time of admission.  Patient's case discussed with Dr. Jearld Fenton who agrees with plan to discharge with follow-up.   Note: Portions of this report may have been transcribed using voice recognition software. Every effort was made to ensure accuracy; however, inadvertent computerized transcription errors may still be present.        Final Clinical Impression(s) / ED Diagnoses Final diagnoses:  Intractable nausea and vomiting  Abnormal EKG  Hypokalemia     Rx / DC Orders ED Discharge Orders     None         Michelle Piper, Cordelia Poche 09/07/23 1959    Loetta Rough, MD 09/07/23 2239

## 2023-09-07 NOTE — ED Triage Notes (Addendum)
Pt seen for same issues. Pt complaining of abd pain, emesis, and cough. Pt reports no improvement in s/s, and increasing in weakness due to inability to keep anything down. Pt has been taking nausea medication, without improvement.

## 2023-09-07 NOTE — H&P (Signed)
History and Physical    [name] XBM:[id number] DOB: 10-10-1986 DOA: 09/07/2023  PCP: Pcp, No  Patient coming from: Home  Chief Complaint: Vomiting  HPI: [name] is a 37 y.o. 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  Patient was seen in the ED 2 days ago for complaints of chest pain, cough, nausea, and vomiting.  She tested negative for COVID at that time.  Chest x-ray was showing no active cardiopulmonary disease.  She was discharged with prescription for Tessalon and advised to take ibuprofen.  Patient returns to the ED today with complaints of epigastric abdominal pain, vomiting, and cough.  Afebrile.  Labs showing no leukocytosis, sodium 131, potassium 2.7, chloride 90, bicarb 26, glucose 175, creatinine 0.7, AST 47, ALT 51 (transaminases slightly elevated on previous labs as well and no significant change), alk phos and T. bili normal, lipase normal, serum hCG negative, magnesium 2.0, UDS pending.  UA with positive nitrite, moderate leukocytes, and microscopy showing 6-10 RBCs, >50 WBCs, and rare bacteria.  Urine culture pending. Patient was given Reglan, morphine, oral potassium 40 mEq, IV potassium 10 mEq x 2, Phenergan, and 2 L normal saline.  [facility] called to admit.   Patient states she had a cough last week which has improved but for the past 3 days she is having nausea and vomiting and has not been able to tolerate p.o. intake.  She is having epigastric pain whenever she vomits.  Denies fevers.  She reports history of heavy alcohol use in the past but has now cut down on her drinking and has not consumed any alcoholic beverages for the past week.  She does smoke marijuana and cigarettes.  Denies hematemesis, hematochezia, or melena.  Denies shortness of breath.  She is endorsing stress incontinence.  Denies  dysuria.  No other complaints.  Review of Systems:  Review of Systems  All other systems reviewed and are negative.   Past Medical History:  Diagnosis Date   Abdominal pain, epigastric 04/17/2013   Adjustment disorder with emotional disturbance    Alcoholic gastritis 03/2013    hospitalization   Dehydration 04/04/2013   Hematemesis 03/2013   along with alcoholic gastritis, hospitalization   Hematemesis- mild 04/04/2013   Intractable nausea, vomiting & abdominal pain. 04/04/2013   Metabolic acidosis 03/2013    hospitaliation due to alcohol abuse, dehyration, nausea/vomiting   Metabolic acidosis, increased anion gap 04/04/2013   Polysubstance abuse (HCC)    Pyelonephritis 03/2010   hospitalization   Seizures (HCC)    Stomach ulcer    never had any tests to confirm this diagni=oses    Past Surgical History:  Procedure Laterality Date   BREAST LUMPECTOMY Right    benign pathology   CESAREAN SECTION  2005   MOUTH SURGERY       reports that she has been smoking cigarettes. She has never used smokeless tobacco. She reports current alcohol use. She reports current drug use. Drugs: Marijuana and Cocaine.  No Known Allergies  Family History  Problem Relation Age of Onset   Diabetes Mother    Hypertension Father    Diabetes Maternal Aunt    Diabetes Maternal Uncle    Cancer Maternal Uncle    Diabetes Maternal Grandmother    Cancer Maternal Grandmother    Diabetes Maternal Grandfather     Prior to Admission medications   Medication Sig Start  Date End Date Taking? Authorizing Provider  acetaminophen (TYLENOL) 500 MG tablet Take 1,000 mg by mouth every 6 (six) hours as needed for mild pain.    [provider]  benzonatate (TESSALON) 100 MG capsule Take 1 capsule (100 mg total) by mouth every 8 (eight) hours. 09/05/23   Palumbo, April, MD  ibuprofen (ADVIL) 800 MG tablet Take 1 tablet (800 mg total) by mouth 3 (three) times daily. 09/05/23   Palumbo, April, MD  ondansetron (ZOFRAN)  4 MG tablet Take 1 tablet (4 mg total) by mouth every 6 (six) hours. 09/08/22   Gareth Eagle, PA-C  pantoprazole (PROTONIX) 40 MG tablet Take 1 tablet (40 mg total) by mouth daily. 07/04/23 08/29/23  Arabella Merles, PA-C  sucralfate (CARAFATE) 1 g tablet Take 1 tablet (1 g total) by mouth 4 (four) times daily -  with meals and at bedtime. 07/04/23 08/29/23  Arabella Merles, PA-C  fluticasone (FLONASE) 50 MCG/ACT nasal spray Place 1 spray into both nostrils daily. 12/13/18 07/06/20  Aviva Kluver B, PA-C  omeprazole (PRILOSEC) 20 MG capsule Take 1 capsule (20 mg total) by mouth daily. Patient not taking: Reported on 12/13/2018 01/28/17 07/06/20  Glynn Octave, MD    Physical Exam: Vitals:   09/07/23 1700 09/07/23 1745 09/07/23 1833 09/07/23 1935  BP: (!) 131/93 119/82  (!) 140/83  Pulse: 71 77  67  Resp: 18 18  17   Temp:   98.3 F (36.8 C) 99.4 F (37.4 C)  TempSrc:   Oral Oral  SpO2: 98% 100%  100%  Weight:      Height:        Physical Exam Vitals reviewed.  Constitutional:      General: She is not in acute distress. HENT:     Head: Normocephalic and atraumatic.  Eyes:     Extraocular Movements: Extraocular movements intact.  Cardiovascular:     Rate and Rhythm: Normal rate and regular rhythm.     Pulses: Normal pulses.  Pulmonary:     Effort: Pulmonary effort is normal. No respiratory distress.     Breath sounds: Normal breath sounds. No wheezing or rales.  Abdominal:     General: Bowel sounds are normal. There is no distension.     Palpations: Abdomen is soft.     Tenderness: There is no abdominal tenderness. There is no guarding or rebound.  Musculoskeletal:     Cervical back: Normal range of motion.     Right lower leg: No edema.     Left lower leg: No edema.  Skin:    General: Skin is warm and dry.  Neurological:     General: No focal deficit present.     Mental Status: She is alert and oriented to person, place, and time.     Labs on Admission: I have  personally reviewed following labs and imaging studies  CBC: Recent Labs  Lab 09/04/23 2206 09/07/23 1434  WBC 4.1 9.3  HGB 10.3* 12.1  HCT 32.5* 38.0  MCV 81.9 79.5*  PLT 298 378   Basic Metabolic Panel: Recent Labs  Lab 09/04/23 2206 09/07/23 1434  NA 134* 131*  K 3.2* 2.7*  CL 98 90*  CO2 18* 26  GLUCOSE 85 175*  BUN 10 10  CREATININE 0.66 0.78  CALCIUM 9.2 9.9  MG  --  2.0   GFR: Estimated Creatinine Clearance: 84.9 mL/min (by C-G formula based on SCr of 0.78 mg/dL). Liver Function Tests: Recent Labs  Lab 09/07/23 1434  AST  47*  ALT 51*  ALKPHOS 86  BILITOT 0.7  PROT 9.7*  ALBUMIN 4.9   Recent Labs  Lab 09/07/23 1434  LIPASE 20   No results for input(s): "AMMONIA" in the last 168 hours. Coagulation Profile: No results for input(s): "INR", "PROTIME" in the last 168 hours. Cardiac Enzymes: No results for input(s): "CKTOTAL", "CKMB", "CKMBINDEX", "TROPONINI" in the last 168 hours. BNP (last 3 results) No results for input(s): "PROBNP" in the last 8760 hours. HbA1C: No results for input(s): "HGBA1C" in the last 72 hours. CBG: No results for input(s): "GLUCAP" in the last 168 hours. Lipid Profile: No results for input(s): "CHOL", "HDL", "LDLCALC", "TRIG", "CHOLHDL", "LDLDIRECT" in the last 72 hours. Thyroid Function Tests: No results for input(s): "TSH", "T4TOTAL", "FREET4", "T3FREE", "THYROIDAB" in the last 72 hours. Anemia Panel: No results for input(s): "VITAMINB12", "FOLATE", "FERRITIN", "TIBC", "IRON", "RETICCTPCT" in the last 72 hours. Urine analysis:    Component Value Date/Time   COLORURINE AMBER (A) 09/07/2023 1453   APPEARANCEUR CLOUDY (A) 09/07/2023 1453   LABSPEC 1.027 09/07/2023 1453   PHURINE 5.0 09/07/2023 1453   GLUCOSEU NEGATIVE 09/07/2023 1453   HGBUR SMALL (A) 09/07/2023 1453   BILIRUBINUR SMALL (A) 09/07/2023 1453   KETONESUR 20 (A) 09/07/2023 1453   PROTEINUR 100 (A) 09/07/2023 1453   UROBILINOGEN 0.2 07/24/2014 1506    NITRITE POSITIVE (A) 09/07/2023 1453   LEUKOCYTESUR MODERATE (A) 09/07/2023 1453    Radiological Exams on Admission: No results found.  EKG: Independently reviewed. Sinus arrhythmia, QTc 540.   Assessment and Plan  Intractable nausea and vomiting Epigastric abdominal pain No fever or leukocytosis.  Transaminases slightly elevated on previous labs as well and no significant change.  Alk phos and T. bili normal.  Lipase normal.  Serum hCG negative.  Abdominal exam benign.  She does have a history of alcoholic gastritis and endorses marijuana use.  Start IV Protonix 40 mg every 12 hours.  Continue IV fluid hydration, pain management, and IM Tigan PRN nausea/vomiting.  Avoid QT prolonging antiemetics.  UDS pending.  Possible UTI Patient is endorsing stress incontinence but no dysuria.  UA with signs of infection.  No fever, leukocytosis, or signs of sepsis.  Start ceftriaxone.  Urine culture pending.  Hypokalemia QT prolongation Magnesium within normal range.  Continue to replace potassium and monitor labs closely.  Avoid QT prolonging drugs and repeat EKG in the morning.  Mild hyponatremia Continue IV fluid hydration and monitor sodium level.  Cough No shortness of breath, tachypnea, or hypoxia.  Cough has improved.  She tested negative for COVID during ED visit 2 days ago and also chest x-ray done at that time was showing no active cardiopulmonary disease.  Antitussive as needed.  Hyperglycemia Glucose 175.  No documented history of diabetes.  Check A1c and place on very sensitive sliding scale insulin every 4 hours for now given intractable nausea/vomiting and poor p.o. intake.  History of alcohol abuse Patient denies current alcohol use.  No signs of withdrawal at this time.  Placed on CIWA monitoring.  DVT prophylaxis: SCDs Code Status: Full Code (discussed with the patient) Level of care: Telemetry bed Admission status: It is my clinical opinion that referral for OBSERVATION is  reasonable and necessary in this patient based on the above information provided. The aforementioned taken together are felt to place the patient at high risk for further clinical deterioration. However, it is anticipated that the patient may be medically stable for discharge from the hospital within 24 to 48  hours.  John Giovanni MD [facility]  If 7PM-7AM, please contact night-coverage [email or web address]  09/07/2023, 7:54 PM

## 2023-09-08 DIAGNOSIS — E876 Hypokalemia: Secondary | ICD-10-CM | POA: Diagnosis not present

## 2023-09-08 DIAGNOSIS — B3781 Candidal esophagitis: Secondary | ICD-10-CM | POA: Diagnosis not present

## 2023-09-08 DIAGNOSIS — R1013 Epigastric pain: Secondary | ICD-10-CM | POA: Diagnosis not present

## 2023-09-08 DIAGNOSIS — K229 Disease of esophagus, unspecified: Secondary | ICD-10-CM | POA: Diagnosis not present

## 2023-09-08 DIAGNOSIS — R112 Nausea with vomiting, unspecified: Secondary | ICD-10-CM | POA: Diagnosis not present

## 2023-09-08 DIAGNOSIS — Z79899 Other long term (current) drug therapy: Secondary | ICD-10-CM | POA: Diagnosis not present

## 2023-09-08 DIAGNOSIS — R079 Chest pain, unspecified: Secondary | ICD-10-CM | POA: Diagnosis present

## 2023-09-08 DIAGNOSIS — R9431 Abnormal electrocardiogram [ECG] [EKG]: Secondary | ICD-10-CM

## 2023-09-08 DIAGNOSIS — R569 Unspecified convulsions: Secondary | ICD-10-CM | POA: Diagnosis not present

## 2023-09-08 DIAGNOSIS — K2900 Acute gastritis without bleeding: Secondary | ICD-10-CM | POA: Diagnosis not present

## 2023-09-08 DIAGNOSIS — E878 Other disorders of electrolyte and fluid balance, not elsewhere classified: Secondary | ICD-10-CM | POA: Diagnosis present

## 2023-09-08 DIAGNOSIS — Z8249 Family history of ischemic heart disease and other diseases of the circulatory system: Secondary | ICD-10-CM | POA: Diagnosis not present

## 2023-09-08 DIAGNOSIS — G40909 Epilepsy, unspecified, not intractable, without status epilepticus: Secondary | ICD-10-CM | POA: Diagnosis present

## 2023-09-08 DIAGNOSIS — Z833 Family history of diabetes mellitus: Secondary | ICD-10-CM | POA: Diagnosis not present

## 2023-09-08 DIAGNOSIS — K2970 Gastritis, unspecified, without bleeding: Secondary | ICD-10-CM | POA: Diagnosis not present

## 2023-09-08 DIAGNOSIS — E871 Hypo-osmolality and hyponatremia: Secondary | ICD-10-CM | POA: Diagnosis present

## 2023-09-08 DIAGNOSIS — F1210 Cannabis abuse, uncomplicated: Secondary | ICD-10-CM | POA: Diagnosis present

## 2023-09-08 DIAGNOSIS — D649 Anemia, unspecified: Secondary | ICD-10-CM | POA: Diagnosis present

## 2023-09-08 DIAGNOSIS — F17210 Nicotine dependence, cigarettes, uncomplicated: Secondary | ICD-10-CM | POA: Diagnosis present

## 2023-09-08 DIAGNOSIS — R7401 Elevation of levels of liver transaminase levels: Secondary | ICD-10-CM | POA: Diagnosis present

## 2023-09-08 DIAGNOSIS — N393 Stress incontinence (female) (male): Secondary | ICD-10-CM | POA: Diagnosis present

## 2023-09-08 DIAGNOSIS — Z1152 Encounter for screening for COVID-19: Secondary | ICD-10-CM | POA: Diagnosis not present

## 2023-09-08 DIAGNOSIS — K219 Gastro-esophageal reflux disease without esophagitis: Secondary | ICD-10-CM | POA: Diagnosis present

## 2023-09-08 DIAGNOSIS — N390 Urinary tract infection, site not specified: Secondary | ICD-10-CM | POA: Diagnosis present

## 2023-09-08 DIAGNOSIS — R739 Hyperglycemia, unspecified: Secondary | ICD-10-CM | POA: Diagnosis present

## 2023-09-08 DIAGNOSIS — F1290 Cannabis use, unspecified, uncomplicated: Secondary | ICD-10-CM | POA: Diagnosis present

## 2023-09-08 DIAGNOSIS — R059 Cough, unspecified: Secondary | ICD-10-CM | POA: Diagnosis not present

## 2023-09-08 LAB — COMPREHENSIVE METABOLIC PANEL
ALT: 42 U/L (ref 0–44)
AST: 48 U/L — ABNORMAL HIGH (ref 15–41)
Albumin: 3.6 g/dL (ref 3.5–5.0)
Alkaline Phosphatase: 61 U/L (ref 38–126)
Anion gap: 9 (ref 5–15)
BUN: 6 mg/dL (ref 6–20)
CO2: 21 mmol/L — ABNORMAL LOW (ref 22–32)
Calcium: 8.2 mg/dL — ABNORMAL LOW (ref 8.9–10.3)
Chloride: 102 mmol/L (ref 98–111)
Creatinine, Ser: 0.54 mg/dL (ref 0.44–1.00)
GFR, Estimated: >60 mL/min (ref >60)
Glucose, Bld: 93 mg/dL (ref 70–99)
Potassium: 3.5 mmol/L (ref 3.5–5.1)
Sodium: 132 mmol/L — ABNORMAL LOW (ref 135–145)
Total Bilirubin: 0.6 mg/dL (ref 0.3–1.2)
Total Protein: 6.8 g/dL (ref 6.5–8.1)

## 2023-09-08 LAB — CBC
HCT: 30.5 % — ABNORMAL LOW (ref 36.0–46.0)
Hemoglobin: 9.6 g/dL — ABNORMAL LOW (ref 12.0–15.0)
MCH: 26.2 pg (ref 26.0–34.0)
MCHC: 31.5 g/dL (ref 30.0–36.0)
MCV: 83.1 fL (ref 80.0–100.0)
Platelets: 286 10*3/uL (ref 150–400)
RBC: 3.67 MIL/uL — ABNORMAL LOW (ref 3.87–5.11)
RDW: 20.5 % — ABNORMAL HIGH (ref 11.5–15.5)
WBC: 9.7 10*3/uL (ref 4.0–10.5)
nRBC: 0 % (ref 0.0–0.2)

## 2023-09-08 LAB — CBG MONITORING, ED
Glucose-Capillary: 103 mg/dL — ABNORMAL HIGH (ref 70–99)
Glucose-Capillary: 128 mg/dL — ABNORMAL HIGH (ref 70–99)
Glucose-Capillary: 136 mg/dL — ABNORMAL HIGH (ref 70–99)

## 2023-09-08 LAB — HIV ANTIBODY (ROUTINE TESTING W REFLEX): HIV Screen 4th Generation wRfx: NONREACTIVE

## 2023-09-08 LAB — MAGNESIUM: Magnesium: 1.6 mg/dL — ABNORMAL LOW (ref 1.7–2.4)

## 2023-09-08 LAB — GLUCOSE, CAPILLARY
Glucose-Capillary: 109 mg/dL — ABNORMAL HIGH (ref 70–99)
Glucose-Capillary: 85 mg/dL (ref 70–99)
Glucose-Capillary: 90 mg/dL (ref 70–99)

## 2023-09-08 MED ORDER — LORAZEPAM 1 MG PO TABS: 1.0000 mg | ORAL_TABLET | ORAL | Status: AC | PRN

## 2023-09-08 MED ORDER — THIAMINE HCL 100 MG/ML IJ SOLN
100.0000 mg | Freq: Every day | INTRAMUSCULAR | Status: DC
  Administered 2023-09-10: 100 mg via INTRAVENOUS
  Filled 2023-09-08 (×2): qty 2

## 2023-09-08 MED ORDER — SODIUM CHLORIDE 0.9 % IV SOLN: INTRAVENOUS | Status: AC

## 2023-09-08 MED ORDER — FOLIC ACID 1 MG PO TABS
1.0000 mg | ORAL_TABLET | Freq: Every day | ORAL | Status: DC
  Administered 2023-09-08 – 2023-09-11 (×4): 1 mg via ORAL
  Filled 2023-09-08 (×4): qty 1

## 2023-09-08 MED ORDER — THIAMINE MONONITRATE 100 MG PO TABS
100.0000 mg | ORAL_TABLET | Freq: Every day | ORAL | Status: DC
  Administered 2023-09-08 – 2023-09-11 (×3): 100 mg via ORAL
  Filled 2023-09-08 (×3): qty 1

## 2023-09-08 MED ORDER — LORAZEPAM 0.5 MG PO TABS: 0.5000 mg | ORAL_TABLET | Freq: Four times a day (QID) | ORAL | Status: AC | PRN

## 2023-09-08 MED ORDER — MAGNESIUM SULFATE 50 % IJ SOLN: 4.0000 g | Freq: Once | INTRAVENOUS | Status: DC

## 2023-09-08 MED ORDER — PROCHLORPERAZINE EDISYLATE 10 MG/2ML IJ SOLN
10.0000 mg | Freq: Four times a day (QID) | INTRAMUSCULAR | Status: DC
  Administered 2023-09-08 – 2023-09-10 (×8): 10 mg via INTRAVENOUS
  Filled 2023-09-08 (×9): qty 2

## 2023-09-08 MED ORDER — POTASSIUM CHLORIDE 10 MEQ/100ML IV SOLN
INTRAVENOUS | Status: AC
  Administered 2023-09-08: 10 meq via INTRAVENOUS
  Filled 2023-09-08: qty 100

## 2023-09-08 MED ORDER — MAGNESIUM SULFATE 4 GM/100ML IV SOLN
4.0000 g | Freq: Once | INTRAVENOUS | Status: AC
  Administered 2023-09-08: 4 g via INTRAVENOUS
  Filled 2023-09-08: qty 100

## 2023-09-08 MED ORDER — ADULT MULTIVITAMIN W/MINERALS CH
1.0000 | ORAL_TABLET | Freq: Every day | ORAL | Status: DC
  Administered 2023-09-08 – 2023-09-11 (×4): 1 via ORAL
  Filled 2023-09-08 (×4): qty 1

## 2023-09-08 NOTE — ED Notes (Signed)
ED TO INPATIENT HANDOFF REPORT  ED Nurse Name and Phone #: Elonda Husky RN [phone]  S Name/Age/Gender [name] 37 y.o. female Room/Bed: WA18/WA18  Code Status   Code Status: Full Code  Home/SNF/Other Home Patient oriented to: self, place, time, and situation Is this baseline? Yes   Triage Complete: Triage complete  Chief Complaint Intractable nausea and vomiting [R11.2]  Triage Note Pt seen for same issues. Pt complaining of abd pain, emesis, and cough. Pt reports no improvement in s/s, and increasing in weakness due to inability to keep anything down. Pt has been taking nausea medication, without improvement.    Allergies No Known Allergies  Level of Care/Admitting Diagnosis ED Disposition     ED Disposition  Admit   Condition  --   Comment  Hospital Area: [facility] [[id number]]  Level of Care: Telemetry [5]  Admit to tele based on following criteria: Monitor QTC interval  May place patient in observation at [facility] or [facility] if equivalent level of care is available:: Yes  Covid Evaluation: Asymptomatic - no recent exposure (last 10 days) testing not required  Diagnosis: Intractable nausea and vomiting [720114]  Admitting Physician: John Giovanni [[id number]]  Attending Physician: John Giovanni [[id number]]          B Medical/Surgery History Past Medical History:  Diagnosis Date   Abdominal pain, epigastric 04/17/2013   Adjustment disorder with emotional disturbance    Alcoholic gastritis 03/2013    hospitalization   Dehydration 04/04/2013   Hematemesis 03/2013   along with alcoholic gastritis, hospitalization   Hematemesis- mild 04/04/2013   Intractable nausea, vomiting & abdominal pain. 04/04/2013   Metabolic acidosis 03/2013    hospitaliation due to alcohol abuse, dehyration, nausea/vomiting   Metabolic acidosis, increased anion gap 04/04/2013   Polysubstance abuse (HCC)    Pyelonephritis 03/2010   hospitalization    Seizures (HCC)    Stomach ulcer    never had any tests to confirm this diagni=oses   Past Surgical History:  Procedure Laterality Date   BREAST LUMPECTOMY Right    benign pathology   CESAREAN SECTION  2005   MOUTH SURGERY       A IV Location/Drains/Wounds Patient Lines/Drains/Airways Status     Active Line/Drains/Airways     Name Placement date Placement time Site Days   Peripheral IV 09/07/23 22 G 1" Anterior;Right Hand 09/07/23  2239  Hand  1   Peripheral IV 09/08/23 20 G Anterior;Left;Upper Arm 09/08/23  0335  Arm  less than 1            Intake/Output Last 24 hours  Intake/Output Summary (Last 24 hours) at 09/08/2023 0902 Last data filed at 09/08/2023 0701 Gross per 24 hour  Intake 1477.76 ml  Output --  Net 1477.76 ml    Labs/Imaging Results for orders placed or performed during the hospital encounter of 09/07/23 (from the past 48 hour(s))  Lipase, blood     Status: None   Collection Time: 09/07/23  2:34 PM  Result Value Ref Range   Lipase 20 11 - 51 U/L    Comment: Performed at [facility], [address], [place], [place] [address]  Comprehensive metabolic panel     Status: Abnormal   Collection Time: 09/07/23  2:34 PM  Result Value Ref Range   Sodium 131 (L) 135 - 145 mmol/L   Potassium 2.7 (LL) 3.5 - 5.1 mmol/L    Comment: CRITICAL RESULT CALLED TO, READ BACK BY AND VERIFIED  WITH TEETERS,Z RN ON 09/07/23 AT 1522 BY GOLSONM    Chloride 90 (L) 98 - 111 mmol/L   CO2 26 22 - 32 mmol/L   Glucose, Bld 175 (H) 70 - 99 mg/dL    Comment: Glucose reference range applies only to samples taken after fasting for at least 8 hours.   BUN 10 6 - 20 mg/dL   Creatinine, Ser 3.08 0.44 - 1.00 mg/dL   Calcium 9.9 8.9 - 65.7 mg/dL   Total Protein 9.7 (H) 6.5 - 8.1 g/dL   Albumin 4.9 3.5 - 5.0 g/dL   AST 47 (H) 15 - 41 U/L   ALT 51 (H) 0 - 44 U/L   Alkaline Phosphatase 86 38 - 126 U/L   Total Bilirubin 0.7 0.3 - 1.2 mg/dL   GFR, Estimated >84 >69  mL/min    Comment: (NOTE) Calculated using the CKD-EPI Creatinine Equation (2021)    Anion gap 15 5 - 15    Comment: Performed at [facility], [address], [place], [place] [address]  CBC     Status: Abnormal   Collection Time: 09/07/23  2:34 PM  Result Value Ref Range   WBC 9.3 4.0 - 10.5 K/uL    Comment: WHITE COUNT CONFIRMED ON SMEAR   RBC 4.78 3.87 - 5.11 MIL/uL   Hemoglobin 12.1 12.0 - 15.0 g/dL   HCT 84.1 32.4 - 40.1 %   MCV 79.5 (L) 80.0 - 100.0 fL   MCH 25.3 (L) 26.0 - 34.0 pg   MCHC 31.8 30.0 - 36.0 g/dL   RDW 02.7 (H) 25.3 - 66.4 %   Platelets 378 150 - 400 K/uL   nRBC 0.0 0.0 - 0.2 %    Comment: Performed at [facility], [address] [place], [place] [address]  hCG, serum, qualitative     Status: None   Collection Time: 09/07/23  2:34 PM  Result Value Ref Range   Preg, Serum NEGATIVE NEGATIVE    Comment:        THE SENSITIVITY OF THIS METHODOLOGY IS >10 mIU/mL. Performed at [facility], [address], [place], [place] [address]   Magnesium     Status: None   Collection Time: 09/07/23  2:34 PM  Result Value Ref Range   Magnesium 2.0 1.7 - 2.4 mg/dL    Comment: Performed at [facility], [address], [place], [place] [address]  Urinalysis, Routine w reflex microscopic -Urine, Clean Catch     Status: Abnormal   Collection Time: 09/07/23  2:53 PM  Result Value Ref Range   Color, Urine AMBER (A) YELLOW    Comment: BIOCHEMICALS MAY BE AFFECTED BY COLOR   APPearance CLOUDY (A) CLEAR   Specific Gravity, Urine 1.027 1.005 - 1.030   pH 5.0 5.0 - 8.0   Glucose, UA NEGATIVE NEGATIVE mg/dL   Hgb urine dipstick SMALL (A) NEGATIVE   Bilirubin Urine SMALL (A) NEGATIVE   Ketones, ur 20 (A) NEGATIVE mg/dL   Protein, ur 643 (A) NEGATIVE mg/dL   Nitrite POSITIVE (A) NEGATIVE   Leukocytes,Ua MODERATE (A) NEGATIVE   RBC / HPF 6-10 0 - 5 RBC/hpf   WBC, UA >50 0 - 5 WBC/hpf   Bacteria, UA  RARE (A) NONE SEEN   Squamous Epithelial / HPF 21-50 0 - 5 /HPF   Mucus PRESENT     Comment: Performed at [facility], [address] [place], [place] [address]  Urinalysis, w/  Reflex to Culture (Infection Suspected) -Urine, Clean Catch     Status: Abnormal   Collection Time: 09/07/23  6:08 PM  Result Value Ref Range   Specimen Source URINE, CLEAN CATCH    Color, Urine YELLOW YELLOW   APPearance HAZY (A) CLEAR   Specific Gravity, Urine 1.018 1.005 - 1.030   pH 7.0 5.0 - 8.0   Glucose, UA NEGATIVE NEGATIVE mg/dL   Hgb urine dipstick NEGATIVE NEGATIVE   Bilirubin Urine NEGATIVE NEGATIVE   Ketones, ur 5 (A) NEGATIVE mg/dL   Protein, ur 30 (A) NEGATIVE mg/dL   Nitrite POSITIVE (A) NEGATIVE   Leukocytes,Ua TRACE (A) NEGATIVE   RBC / HPF 0-5 0 - 5 RBC/hpf   WBC, UA 11-20 0 - 5 WBC/hpf    Comment:        Reflex urine culture not performed if WBC <=10, OR if Squamous epithelial cells >5. If Squamous epithelial cells >5 suggest recollection.    Bacteria, UA MANY (A) NONE SEEN   Squamous Epithelial / HPF 0-5 0 - 5 /HPF   Mucus PRESENT     Comment: Performed at [facility], [address], [place], [place] [address]  Rapid urine drug screen (hospital performed)     Status: Abnormal   Collection Time: 09/07/23  8:03 PM  Result Value Ref Range   Opiates POSITIVE (A) NONE DETECTED   Cocaine NONE DETECTED NONE DETECTED   Benzodiazepines NONE DETECTED NONE DETECTED   Amphetamines NONE DETECTED NONE DETECTED   Tetrahydrocannabinol POSITIVE (A) NONE DETECTED   Barbiturates NONE DETECTED NONE DETECTED    Comment: (NOTE) DRUG SCREEN FOR MEDICAL PURPOSES ONLY.  IF CONFIRMATION IS NEEDED FOR ANY PURPOSE, NOTIFY LAB WITHIN 5 DAYS.  LOWEST DETECTABLE LIMITS FOR URINE DRUG SCREEN Drug Class                     Cutoff (ng/mL) Amphetamine and metabolites    1000 Barbiturate and metabolites    200 Benzodiazepine                 200 Opiates and  metabolites        300 Cocaine and metabolites        300 THC                            50 Performed at [facility], [address], [place], [place] [address]   CBG monitoring, ED     Status: None   Collection Time: 09/07/23 10:06 PM  Result Value Ref Range   Glucose-Capillary 92 70 - 99 mg/dL    Comment: Glucose reference range applies only to samples taken after fasting for at least 8 hours.  CBG monitoring, ED     Status: Abnormal   Collection Time: 09/08/23 12:54 AM  Result Value Ref Range   Glucose-Capillary 136 (H) 70 - 99 mg/dL    Comment: Glucose reference range applies only to samples taken after fasting for at least 8 hours.  CBG monitoring, ED     Status: Abnormal   Collection Time: 09/08/23  3:39 AM  Result Value Ref Range   Glucose-Capillary 128 (H) 70 - 99 mg/dL    Comment: Glucose reference range applies only to samples taken after fasting for at least 8 hours.  Comprehensive metabolic panel     Status: Abnormal   Collection Time: 09/08/23  7:23 AM  Result Value Ref Range  Sodium 132 (L) 135 - 145 mmol/L   Potassium 3.5 3.5 - 5.1 mmol/L   Chloride 102 98 - 111 mmol/L   CO2 21 (L) 22 - 32 mmol/L   Glucose, Bld 93 70 - 99 mg/dL    Comment: Glucose reference range applies only to samples taken after fasting for at least 8 hours.   BUN 6 6 - 20 mg/dL   Creatinine, Ser 5.78 0.44 - 1.00 mg/dL   Calcium 8.2 (L) 8.9 - 10.3 mg/dL   Total Protein 6.8 6.5 - 8.1 g/dL   Albumin 3.6 3.5 - 5.0 g/dL   AST 48 (H) 15 - 41 U/L   ALT 42 0 - 44 U/L   Alkaline Phosphatase 61 38 - 126 U/L   Total Bilirubin 0.6 0.3 - 1.2 mg/dL   GFR, Estimated >46 >96 mL/min    Comment: (NOTE) Calculated using the CKD-EPI Creatinine Equation (2021)    Anion gap 9 5 - 15    Comment: Performed at [facility], [address], [place], [place] [address]  Magnesium     Status: Abnormal   Collection Time: 09/08/23  7:23 AM  Result Value Ref Range   Magnesium  1.6 (L) 1.7 - 2.4 mg/dL    Comment: Performed at [facility], [address] [place], [place] [address]  CBC     Status: Abnormal   Collection Time: 09/08/23  7:23 AM  Result Value Ref Range   WBC 9.7 4.0 - 10.5 K/uL   RBC 3.67 (L) 3.87 - 5.11 MIL/uL   Hemoglobin 9.6 (L) 12.0 - 15.0 g/dL   HCT 40.1 (L) 02.7 - 25.3 %   MCV 83.1 80.0 - 100.0 fL   MCH 26.2 26.0 - 34.0 pg   MCHC 31.5 30.0 - 36.0 g/dL   RDW 66.4 (H) 40.3 - 47.4 %   Platelets 286 150 - 400 K/uL   nRBC 0.0 0.0 - 0.2 %    Comment: Performed at [facility], [address], [place], [place] [address]  CBG monitoring, ED     Status: Abnormal   Collection Time: 09/08/23  8:38 AM  Result Value Ref Range   Glucose-Capillary 103 (H) 70 - 99 mg/dL    Comment: Glucose reference range applies only to samples taken after fasting for at least 8 hours.   No results found.  Pending Labs Unresulted Labs (From admission, onward)     Start     Ordered   09/09/23 0500  Basic metabolic panel  Daily,   R      09/08/23 0730   09/09/23 0500  CBC  Daily,   R      09/08/23 0730   09/07/23 2031  Hemoglobin A1c  Once,   R       Comments: To assess prior glycemic control    09/07/23 2031   09/07/23 2028  HIV Antibody (routine testing w rflx)  (HIV Antibody (Routine testing w reflex) panel)  Once,   R        09/07/23 2031            Vitals/Pain Today's Vitals   09/08/23 0600 09/08/23 0619 09/08/23 0700 09/08/23 0800  BP: 100/70     Pulse: (!) 58  62 66  Resp: 15   (!) 22  Temp: 98.7 F (37.1 C)     TempSrc: Oral     SpO2: 99%   100%  Weight:      Height:  PainSc:  5       Isolation Precautions No active isolations  Medications Medications  morphine (PF) 2 MG/ML injection 1 mg (1 mg Intravenous Given 09/08/23 0520)  naloxone (NARCAN) injection 0.4 mg (has no administration in time range)  guaiFENesin-dextromethorphan (ROBITUSSIN DM) 100-10 MG/5ML syrup 5 mL (has no  administration in time range)  acetaminophen (TYLENOL) tablet 650 mg (650 mg Oral Given 09/08/23 0737)    Or  acetaminophen (TYLENOL) suppository 650 mg ( Rectal See Alternative 09/08/23 0737)  trimethobenzamide (TIGAN) injection 200 mg (200 mg Intramuscular Given 09/08/23 0516)  pantoprazole (PROTONIX) injection 40 mg (40 mg Intravenous Given 09/07/23 2239)  insulin aspart (novoLOG) injection 0-6 Units ( Subcutaneous Not Given 09/08/23 0845)  cefTRIAXone (ROCEPHIN) 1 g in sodium chloride 0.9 % 100 mL IVPB (0 g Intravenous Stopped 09/07/23 2316)  0.9 %  sodium chloride infusion ( Intravenous New Bag/Given 09/08/23 0742)  prochlorperazine (COMPAZINE) injection 10 mg (10 mg Intravenous Given 09/08/23 0741)  sodium chloride 0.9 % bolus 2,000 mL (0 mLs Intravenous Stopped 09/07/23 1802)  potassium chloride 10 mEq in 100 mL IVPB (0 mEq Intravenous Stopped 09/07/23 1802)  morphine (PF) 4 MG/ML injection 4 mg (4 mg Intravenous Given 09/07/23 1554)  potassium chloride SA (KLOR-CON M) CR tablet 40 mEq (40 mEq Oral Given 09/07/23 1856)  metoCLOPramide (REGLAN) injection 10 mg (10 mg Intravenous Given 09/07/23 1931)  morphine (PF) 4 MG/ML injection 4 mg (4 mg Intravenous Given 09/07/23 1933)  potassium chloride 10 mEq in 100 mL IVPB (0 mEq Intravenous Stopped 09/08/23 0500)    Mobility walks      R Recommendations: See Admitting Provider Note  Report given to:   Additional Notes: Patient is on RA.

## 2023-09-08 NOTE — Progress Notes (Signed)
[facility]                                                                              [name], is a 37 y.o. 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  Patient was seen in ED 2 days prior for complaints of chest pain, cough, nausea and vomiting, tested negative for COVID with negative chest x-ray.  She was discharged with prescription for Tessalon and ibuprofen.  Patient returned to ED with epigastric abdominal pain, vomiting, cough, not able to tolerate p.o. intake.  No hematemesis melena or hematochezia. Has cut down on the drinking however does smoke marijuana and cigarettes. No leukocytosis.  Mildly elevated transaminases.  UA positive for UTI.    Assessment & Plan    Principal Problem:   Intractable nausea and vomiting Active Problems:   Hypokalemia   Epigastric abdominal pain   Hyponatremia   QT prolongation   UTI (urinary tract infection)   Cough   Hyperglycemia   History of alcohol abuse   Marijuana use   Intractable nausea and vomiting Epigastric abdominal pain -Mildly elevated transaminases, also noted on previous labs, lipase normal, serum hCG negative. -Continue IV PPI, IV fluid hydration, pain management and antiemetics -If no significant improvement, will consult GI -Due to prolonged QTc, avoid Reglan, Zofran, placed on scheduled Compazine, continue IM Tigan as needed  History of marijuana use -Nausea/vomiting could be related to marijuana use, counseled on cessation -Continue symptomatic management for nausea and vomiting    UTI -UA positive for UTI,  follow urine culture and sensitivities -Continue IV Rocephin   Hypokalemia QT prolongation -Magnesium 1.6, placed on IV magnesium -Avoid QT prolonging meds -Potassium WNL   Mild hyponatremia -Likely due to #1, continue IVF, improving    Cough -No shortness of breath, tachypnea, hypoxia or fevers.  Improving  -Chest x-ray negative for pneumonia, COVID-negative    Hyperglycemia -Follow an A1c glucose 175 on admission.  No documented history of diabetes.   -Continue SSI   History of alcohol abuse - Patient denies current alcohol use.  - Continue to monitor closely for any withdrawals.  Placed on CIWA monitoring with Ativan. -   Estimated body mass index is 25.4 kg/m as calculated from the following:   Height as of this encounter: 5\' 2"  (1.575 m).   Weight as of this encounter: 63 kg.  Code Status: Full code DVT Prophylaxis:  SCDs Start: 09/07/23 2029   Level of Care: Level of care: Telemetry Family Communication: Updated patient's family member in the room Disposition Plan:      Remains inpatient appropriate: Still symptomatic with nausea and vomiting   Procedures:  None  Consultants:   None  Antimicrobials:   Anti-infectives (From admission, onward)  Start     Dose/Rate Route Frequency Ordered Stop   09/07/23 2100  cefTRIAXone (ROCEPHIN) 1 g in sodium chloride 0.9 % 100 mL IVPB        1 g 200 mL/hr over 30 Minutes Intravenous Every 24 hours 09/07/23 2031            Medications  insulin aspart  0-6 Units Subcutaneous Q4H   pantoprazole (PROTONIX) IV  40 mg Intravenous Q12H   prochlorperazine  10 mg Intravenous Q6H      Subjective:   [name] was seen and examined today.  Still feeling very nauseous, states right shoulder hurting as she received IM injection of Tigan in the arm.  Still having some epigastric abdominal pain, no acute vomiting, chest pain, shortness of breath, fevers.  No hematemesis.  Objective:   Vitals:   09/08/23 0400  09/08/23 0600 09/08/23 0700 09/08/23 0800  BP: 110/67 100/70    Pulse: 66 (!) 58 62 66  Resp: 19 15  (!) 22  Temp: 98.9 F (37.2 C) 98.7 F (37.1 C)    TempSrc: Oral Oral    SpO2: 99% 99%  100%  Weight:      Height:        Intake/Output Summary (Last 24 hours) at 09/08/2023 1122 Last data filed at 09/08/2023 0701 Gross per 24 hour  Intake 1477.76 ml  Output --  Net 1477.76 ml     Wt Readings from Last 3 Encounters:  09/07/23 63 kg  09/04/23 63 kg  07/04/23 63 kg     Exam General: Alert and oriented x 3, NAD, uncomfortable Cardiovascular: S1 S2 auscultated,  RRR Respiratory: Clear to auscultation bilaterally, no wheezing Gastrointestinal: Soft, mild epigastric TTP, nondistended, + bowel sounds Ext: no pedal edema bilaterally Neuro: no new deficits Psych: Normal affect     Data Reviewed:  I have personally reviewed following labs    CBC Lab Results  Component Value Date   WBC 9.7 09/08/2023   RBC 3.67 (L) 09/08/2023   HGB 9.6 (L) 09/08/2023   HCT 30.5 (L) 09/08/2023   MCV 83.1 09/08/2023   MCH 26.2 09/08/2023   PLT 286 09/08/2023   MCHC 31.5 09/08/2023   RDW 20.5 (H) 09/08/2023   LYMPHSABS 1.0 07/04/2023   MONOABS 0.5 07/04/2023   EOSABS 0.1 07/04/2023   BASOSABS 0.0 07/04/2023     Last metabolic panel Lab Results  Component Value Date   NA 132 (L) 09/08/2023   K 3.5 09/08/2023   CL 102 09/08/2023   CO2 21 (L) 09/08/2023   BUN 6 09/08/2023   CREATININE 0.54 09/08/2023   GLUCOSE 93 09/08/2023   GFRNONAA >60 09/08/2023   GFRAA >60 12/13/2018   CALCIUM 8.2 (L) 09/08/2023   PHOS 2.6 08/08/2022   PROT 6.8 09/08/2023   ALBUMIN 3.6 09/08/2023   BILITOT 0.6 09/08/2023   ALKPHOS 61 09/08/2023   AST 48 (H) 09/08/2023   ALT 42 09/08/2023   ANIONGAP 9 09/08/2023    CBG (last 3)  Recent Labs    09/08/23 0054 09/08/23 0339 09/08/23 0838  GLUCAP 136* 128* 103*      Coagulation Profile: No results for input(s): "INR", "PROTIME" in the last  168 hours.   Radiology Studies: I have personally reviewed the imaging studies  No results found.     Thad Ranger M.D. [facility] 09/08/2023, 11:22 AM  Available via Epic secure chat 7am-7pm After 7 pm, please refer to night coverage provider listed on amion.

## 2023-09-08 NOTE — Plan of Care (Signed)
Pt is progressing 

## 2023-09-09 DIAGNOSIS — R112 Nausea with vomiting, unspecified: Secondary | ICD-10-CM | POA: Diagnosis not present

## 2023-09-09 DIAGNOSIS — R9431 Abnormal electrocardiogram [ECG] [EKG]: Secondary | ICD-10-CM | POA: Diagnosis not present

## 2023-09-09 DIAGNOSIS — E876 Hypokalemia: Secondary | ICD-10-CM | POA: Diagnosis not present

## 2023-09-09 LAB — BASIC METABOLIC PANEL
Anion gap: 8 (ref 5–15)
BUN: <5 mg/dL — ABNORMAL LOW (ref 6–20)
CO2: 21 mmol/L — ABNORMAL LOW (ref 22–32)
Calcium: 8.1 mg/dL — ABNORMAL LOW (ref 8.9–10.3)
Chloride: 105 mmol/L (ref 98–111)
Creatinine, Ser: 0.4 mg/dL — ABNORMAL LOW (ref 0.44–1.00)
GFR, Estimated: >60 mL/min (ref >60)
Glucose, Bld: 99 mg/dL (ref 70–99)
Potassium: 3.2 mmol/L — ABNORMAL LOW (ref 3.5–5.1)
Sodium: 134 mmol/L — ABNORMAL LOW (ref 135–145)

## 2023-09-09 LAB — CBC
HCT: 27.2 % — ABNORMAL LOW (ref 36.0–46.0)
Hemoglobin: 8.5 g/dL — ABNORMAL LOW (ref 12.0–15.0)
MCH: 26.5 pg (ref 26.0–34.0)
MCHC: 31.3 g/dL (ref 30.0–36.0)
MCV: 84.7 fL (ref 80.0–100.0)
Platelets: 234 10*3/uL (ref 150–400)
RBC: 3.21 MIL/uL — ABNORMAL LOW (ref 3.87–5.11)
RDW: 20.7 % — ABNORMAL HIGH (ref 11.5–15.5)
WBC: 5.2 10*3/uL (ref 4.0–10.5)
nRBC: 0 % (ref 0.0–0.2)

## 2023-09-09 LAB — GLUCOSE, CAPILLARY
Glucose-Capillary: 103 mg/dL — ABNORMAL HIGH (ref 70–99)
Glucose-Capillary: 66 mg/dL — ABNORMAL LOW (ref 70–99)
Glucose-Capillary: 73 mg/dL (ref 70–99)
Glucose-Capillary: 81 mg/dL (ref 70–99)
Glucose-Capillary: 85 mg/dL (ref 70–99)
Glucose-Capillary: 90 mg/dL (ref 70–99)
Glucose-Capillary: 97 mg/dL (ref 70–99)

## 2023-09-09 LAB — HEMOGLOBIN A1C
Hgb A1c MFr Bld: 5.5 % (ref 4.8–5.6)
Mean Plasma Glucose: 111 mg/dL

## 2023-09-09 MED ORDER — POTASSIUM CHLORIDE CRYS ER 20 MEQ PO TBCR
40.0000 meq | EXTENDED_RELEASE_TABLET | Freq: Once | ORAL | Status: AC
  Administered 2023-09-09: 40 meq via ORAL
  Filled 2023-09-09: qty 2

## 2023-09-09 MED ORDER — BENZONATATE 100 MG PO CAPS
200.0000 mg | ORAL_CAPSULE | Freq: Three times a day (TID) | ORAL | Status: DC
  Administered 2023-09-09 – 2023-09-11 (×6): 200 mg via ORAL
  Filled 2023-09-09 (×6): qty 2

## 2023-09-09 NOTE — Plan of Care (Signed)
  Problem: Education: Goal: Ability to describe self-care measures that may prevent or decrease complications (Diabetes Survival Skills Education) will improve Outcome: Progressing Goal: Individualized Educational Video(s) Outcome: Progressing   Problem: Coping: Goal: Ability to adjust to condition or change in health will improve Outcome: Progressing   Problem: Fluid Volume: Goal: Ability to maintain a balanced intake and output will improve Outcome: Progressing   Problem: Health Behavior/Discharge Planning: Goal: Ability to identify and utilize available resources and services will improve Outcome: Progressing Goal: Ability to manage health-related needs will improve Outcome: Progressing   Problem: Metabolic: Goal: Ability to maintain appropriate glucose levels will improve Outcome: Progressing   Problem: Skin Integrity: Goal: Risk for impaired skin integrity will decrease Outcome: Progressing   Problem: Tissue Perfusion: Goal: Adequacy of tissue perfusion will improve Outcome: Progressing   Problem: Education: Goal: Knowledge of General Education information will improve Description: Including pain rating scale, medication(s)/side effects and non-pharmacologic comfort measures Outcome: Progressing   Problem: Health Behavior/Discharge Planning: Goal: Ability to manage health-related needs will improve Outcome: Progressing   Problem: Clinical Measurements: Goal: Ability to maintain clinical measurements within normal limits will improve Outcome: Progressing Goal: Will remain free from infection Outcome: Progressing Goal: Diagnostic test results will improve Outcome: Progressing Goal: Respiratory complications will improve Outcome: Progressing Goal: Cardiovascular complication will be avoided Outcome: Progressing   Problem: Activity: Goal: Risk for activity intolerance will decrease Outcome: Progressing   Problem: Nutrition: Goal: Adequate nutrition will be  maintained Outcome: Progressing   Problem: Coping: Goal: Level of anxiety will decrease Outcome: Progressing   Problem: Elimination: Goal: Will not experience complications related to bowel motility Outcome: Progressing Goal: Will not experience complications related to urinary retention Outcome: Progressing   Problem: Pain Managment: Goal: General experience of comfort will improve Outcome: Progressing   Problem: Safety: Goal: Ability to remain free from injury will improve Outcome: Progressing   Problem: Skin Integrity: Goal: Risk for impaired skin integrity will decrease Outcome: Progressing

## 2023-09-09 NOTE — Progress Notes (Signed)
[facility]                                                                              [name], is a 37 y.o. 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  Patient was seen in ED 2 days prior for complaints of chest pain, cough, nausea and vomiting, tested negative for COVID with negative chest x-ray.  She was discharged with prescription for Tessalon and ibuprofen.  Patient returned to ED with epigastric abdominal pain, vomiting, cough, not able to tolerate p.o. intake.  No hematemesis melena or hematochezia. Has cut down on the drinking however does smoke marijuana and cigarettes. No leukocytosis.  Mildly elevated transaminases.  UA positive for UTI.    Assessment & Plan      Intractable nausea and vomiting Epigastric abdominal pain -Mildly elevated transaminases, also noted on previous labs, lipase normal, serum hCG negative. -Continue IV PPI, IV fluid hydration, pain management and antiemetics -On further questioning patient reported that she did take high-dose ibuprofen 800 mg in the last week for the abdominal pain, had THC use however has been having intermittent bouts of intractable nausea vomiting with abdominal pain in the last year but no GI workup.  She has been seen in the ER multiple times with nausea vomiting and epigastric pain, treated symptomatically and discharged. -Will continue clear liquid diet, antiemetics with scheduled Compazine, IM Tigan as needed.  Avoid Reglan and Zofran due to prolonged QTc. -Will consult gastroenterology, needs endoscopy  History of  marijuana use -Nausea/vomiting could be related to marijuana use, counseled on cessation -Continue symptomatic management for nausea and vomiting    UTI -UA positive for UTI,  -Urine culture showed no growth.  Blood cultures negative -Continue IV Rocephin, complete 3 doses and stop   Hypokalemia QT prolongation -Magnesium 1.6, placed on IV magnesium -Avoid QT prolonging meds -Potassium WNL   Mild hyponatremia -Likely due to #1, continue IVF, improving   Hypokalemia -Replaced   Cough -No shortness of breath, tachypnea, hypoxia or fevers.  Improving  -Chest x-ray negative for pneumonia, COVID-negative  -Continue Tessalon Perles   Hyperglycemia -CBG 175 on admission, no documented history of diabetes -Hemoglobin A1c 5.5 -Continue SSI   History of alcohol abuse - Patient denies current alcohol use.  - Continue to monitor closely for any withdrawals.  Placed on CIWA monitoring with Ativan.   Estimated body mass index is 25.4 kg/m as calculated from the following:   Height as of this encounter: 5\' 2"  (1.575 m).   Weight as of this encounter: 63 kg.  Code Status: Full code DVT Prophylaxis:  SCDs Start: 09/07/23 2029   Level of Care: Level of care: Telemetry Family Communication: Updated patient's sister at the bedside Disposition  Plan:      Remains inpatient appropriate: Still symptomatic with nausea and vomiting   Procedures:  None  Consultants:   GI  Antimicrobials:   Anti-infectives (From admission, onward)    Start     Dose/Rate Route Frequency Ordered Stop   09/07/23 2100  cefTRIAXone (ROCEPHIN) 1 g in sodium chloride 0.9 % 100 mL IVPB        1 g 200 mL/hr over 30 Minutes Intravenous Every 24 hours 09/07/23 2031            Medications  folic acid  1 mg Oral Daily   insulin aspart  0-6 Units Subcutaneous Q4H   multivitamin with minerals  1 tablet Oral Daily   pantoprazole (PROTONIX) IV  40 mg Intravenous Q12H   prochlorperazine  10 mg  Intravenous Q6H   thiamine  100 mg Oral Daily   Or   thiamine  100 mg Intravenous Daily      Subjective:   Oval Linsey was seen and examined today.  Still nauseous, no fevers or chills, had episodes of vomiting x 1 yesterday.  No hematemesis.  Still has epigastric abdominal pain  Objective:   Vitals:   09/08/23 2003 09/08/23 2357 09/09/23 0414 09/09/23 1320  BP: 114/70 (!) 115/59 112/64 122/77  Pulse: 81 76 61 61  Resp: 17 16 16 17   Temp: 98.2 F (36.8 C) 98.7 F (37.1 C) 98.1 F (36.7 C)   TempSrc: Oral Oral Oral   SpO2: 100% 100% 100% 100%  Weight:      Height:        Intake/Output Summary (Last 24 hours) at 09/09/2023 1530 Last data filed at 09/09/2023 0400 Gross per 24 hour  Intake 100 ml  Output --  Net 100 ml     Wt Readings from Last 3 Encounters:  09/07/23 63 kg  09/04/23 63 kg  07/04/23 63 kg    Physical Exam General: Alert and oriented x 3, NAD Cardiovascular: S1 S2 clear, RRR.  Respiratory: CTAB, no wheezing Gastrointestinal: Soft, mild epigastric TTP , nondistended, NBS Ext: no pedal edema bilaterally Neuro: no new deficits Psych: Normal affect    Data Reviewed:  I have personally reviewed following labs    CBC Lab Results  Component Value Date   WBC 5.2 09/09/2023   RBC 3.21 (L) 09/09/2023   HGB 8.5 (L) 09/09/2023   HCT 27.2 (L) 09/09/2023   MCV 84.7 09/09/2023   MCH 26.5 09/09/2023   PLT 234 09/09/2023   MCHC 31.3 09/09/2023   RDW 20.7 (H) 09/09/2023   LYMPHSABS 1.0 07/04/2023   MONOABS 0.5 07/04/2023   EOSABS 0.1 07/04/2023   BASOSABS 0.0 07/04/2023     Last metabolic panel Lab Results  Component Value Date   NA 134 (L) 09/09/2023   K 3.2 (L) 09/09/2023   CL 105 09/09/2023   CO2 21 (L) 09/09/2023   BUN <5 (L) 09/09/2023   CREATININE 0.40 (L) 09/09/2023   GLUCOSE 99 09/09/2023   GFRNONAA >60 09/09/2023   GFRAA >60 12/13/2018   CALCIUM 8.1 (L) 09/09/2023   PHOS 2.6 08/08/2022   PROT 6.8 09/08/2023   ALBUMIN 3.6  09/08/2023   BILITOT 0.6 09/08/2023   ALKPHOS 61 09/08/2023   AST 48 (H) 09/08/2023   ALT 42 09/08/2023   ANIONGAP 8 09/09/2023    CBG (last 3)  Recent Labs    09/09/23 0410 09/09/23 0725 09/09/23 1137  GLUCAP 90 85 97      Coagulation Profile: No  results for input(s): "INR", "PROTIME" in the last 168 hours.   Radiology Studies: I have personally reviewed the imaging studies  No results found.     Thad Ranger M.D. [facility] 09/09/2023, 3:30 PM  Available via Epic secure chat 7am-7pm After 7 pm, please refer to night coverage provider listed on amion.

## 2023-09-09 NOTE — H&P (View-Only) (Signed)
Reason for Consult: Nausea and vomiting Referring Physician: [facility]  [name] HPI: This is a 37 year old female with a PMH of intermittent nausea and vomiting, polysubstance abuse, and ETOH abuse admitted with complaints of cough induced vomiting.  Her symptoms started a few weeks ago, but it progressively worsened over the past week.  She was in the ER with complaints of a cough last week and she was prescribed Jerilynn Som, which were helpful.  However, it progressed to a point that she was not able to arrest the cough with the medication and she developed post-tussive vomiting.  As a result she was not able to tolerate any PO over this past week and she presented to the hospital with her symptoms.  In the past she suffered with nausea and vomiting without any known triggers.  Over a course of a year she would have 2-3 bouts of intermittent nausea and vomiting that were quite severe and it required medical therapy.  The symptoms would start acutely and severely, which prevented to from prophylaxing with any antiemetics or PPIs.  She did report that in there ER she was provided with antiemetics and PPIs, which helped.  On a routine basis she smokes marijuana and the utox was noted to be positive multiple times all the back to 2014.  She does not report ever being helped with taking a hot shower.  Past Medical History:  Diagnosis Date   Abdominal pain, epigastric 04/17/2013   Adjustment disorder with emotional disturbance    Alcoholic gastritis 03/2013    hospitalization   Dehydration 04/04/2013   Hematemesis 03/2013   along with alcoholic gastritis, hospitalization   Hematemesis- mild 04/04/2013   Intractable nausea, vomiting & abdominal pain. 04/04/2013   Metabolic acidosis 03/2013    hospitaliation due to alcohol abuse, dehyration, nausea/vomiting   Metabolic acidosis, increased anion gap 04/04/2013   Polysubstance abuse (HCC)    Pyelonephritis 03/2010   hospitalization   Seizures  (HCC)    Stomach ulcer    never had any tests to confirm this diagni=oses    Past Surgical History:  Procedure Laterality Date   BREAST LUMPECTOMY Right    benign pathology   CESAREAN SECTION  2005   MOUTH SURGERY      Family History  Problem Relation Age of Onset   Diabetes Mother    Hypertension Father    Diabetes Maternal Aunt    Diabetes Maternal Uncle    Cancer Maternal Uncle    Diabetes Maternal Grandmother    Cancer Maternal Grandmother    Diabetes Maternal Grandfather     Social History:  reports that she has been smoking cigarettes. She has never used smokeless tobacco. She reports current alcohol use. She reports current drug use. Drugs: Marijuana and Cocaine.  Allergies: No Known Allergies  Medications: Scheduled:  folic acid  1 mg Oral Daily   insulin aspart  0-6 Units Subcutaneous Q4H   multivitamin with minerals  1 tablet Oral Daily   pantoprazole (PROTONIX) IV  40 mg Intravenous Q12H   prochlorperazine  10 mg Intravenous Q6H   thiamine  100 mg Oral Daily   Or   thiamine  100 mg Intravenous Daily   Continuous:  sodium chloride 100 mL/hr at 09/09/23 0518   cefTRIAXone (ROCEPHIN)  IV 1 g (09/08/23 2143)    Results for orders placed or performed during the hospital encounter of 09/07/23 (from the past 24 hour(s))  Glucose, capillary     Status: None  Collection Time: 09/08/23  3:44 PM  Result Value Ref Range   Glucose-Capillary 85 70 - 99 mg/dL  Glucose, capillary     Status: None   Collection Time: 09/08/23 10:22 PM  Result Value Ref Range   Glucose-Capillary 90 70 - 99 mg/dL  Glucose, capillary     Status: Abnormal   Collection Time: 09/08/23 11:56 PM  Result Value Ref Range   Glucose-Capillary 109 (H) 70 - 99 mg/dL  Glucose, capillary     Status: None   Collection Time: 09/09/23  4:10 AM  Result Value Ref Range   Glucose-Capillary 90 70 - 99 mg/dL  Basic metabolic panel     Status: Abnormal   Collection Time: 09/09/23  5:47 AM  Result  Value Ref Range   Sodium 134 (L) 135 - 145 mmol/L   Potassium 3.2 (L) 3.5 - 5.1 mmol/L   Chloride 105 98 - 111 mmol/L   CO2 21 (L) 22 - 32 mmol/L   Glucose, Bld 99 70 - 99 mg/dL   BUN <5 (L) 6 - 20 mg/dL   Creatinine, Ser 1.61 (L) 0.44 - 1.00 mg/dL   Calcium 8.1 (L) 8.9 - 10.3 mg/dL   GFR, Estimated >09 >60 mL/min   Anion gap 8 5 - 15  CBC     Status: Abnormal   Collection Time: 09/09/23  5:47 AM  Result Value Ref Range   WBC 5.2 4.0 - 10.5 K/uL   RBC 3.21 (L) 3.87 - 5.11 MIL/uL   Hemoglobin 8.5 (L) 12.0 - 15.0 g/dL   HCT 45.4 (L) 09.8 - 11.9 %   MCV 84.7 80.0 - 100.0 fL   MCH 26.5 26.0 - 34.0 pg   MCHC 31.3 30.0 - 36.0 g/dL   RDW 14.7 (H) 82.9 - 56.2 %   Platelets 234 150 - 400 K/uL   nRBC 0.0 0.0 - 0.2 %  Glucose, capillary     Status: None   Collection Time: 09/09/23  7:25 AM  Result Value Ref Range   Glucose-Capillary 85 70 - 99 mg/dL  Glucose, capillary     Status: None   Collection Time: 09/09/23 11:37 AM  Result Value Ref Range   Glucose-Capillary 97 70 - 99 mg/dL     No results found.  ROS:  As stated above in the HPI otherwise negative.  Blood pressure 122/77, pulse 61, temperature 98.1 F (36.7 C), temperature source Oral, resp. rate 17, height 5\' 2"  (1.575 m), weight 63 kg, last menstrual period 08/27/2023, SpO2 100%, currently breastfeeding.    PE: Gen: NAD, Alert and Oriented HEENT:  [place]/AT, EOMI Neck: Supple, no LAD Lungs: CTA Bilaterally CV: RRR without M/G/R ABD: Soft, NTND, +BS Ext: No C/C/E  Assessment/Plan: 1) Chronic and intermittent nausea and vomiting. 2) Post-tussive vomiting. 3) GERD. 4) Marijuana abuse. 5) Anemia - chronic.   The patient warrants further evaluation with an EGD.  She did report being helped with using a PPI.  It may be that she needs to be on a PPI chronically.  She does use marijuana on a routine basis, but it does not appear that she has CHS, although she was encouraged to stop smoking marijuana as there may be some  component to her nausea and vomiting.  There is a noted anemia, but this appears chronic and she seems to be near her baseline.  She does not report any issues with hematemesis, hematochezia, or melena.    Plan: 1) EGD with Dr. Dulce Sellar tomorrow. 2) Continue with  pantoprazole. 3) IV hydration. 4) Antiemetics.  [name] D 09/09/2023, 2:43 PM

## 2023-09-09 NOTE — Consult Note (Signed)
Reason for Consult: Nausea and vomiting Referring Physician: [facility]  Lucina Mellow HPI: This is a 37 year old female with a PMH of intermittent nausea and vomiting, polysubstance abuse, and ETOH abuse admitted with complaints of cough induced vomiting.  Her symptoms started a few weeks ago, but it progressively worsened over the past week.  She was in the ER with complaints of a cough last week and she was prescribed Jerilynn Som, which were helpful.  However, it progressed to a point that she was not able to arrest the cough with the medication and she developed post-tussive vomiting.  As a result she was not able to tolerate any PO over this past week and she presented to the hospital with her symptoms.  In the past she suffered with nausea and vomiting without any known triggers.  Over a course of a year she would have 2-3 bouts of intermittent nausea and vomiting that were quite severe and it required medical therapy.  The symptoms would start acutely and severely, which prevented to from prophylaxing with any antiemetics or PPIs.  She did report that in there ER she was provided with antiemetics and PPIs, which helped.  On a routine basis she smokes marijuana and the utox was noted to be positive multiple times all the back to 2014.  She does not report ever being helped with taking a hot shower.  Past Medical History:  Diagnosis Date   Abdominal pain, epigastric 04/17/2013   Adjustment disorder with emotional disturbance    Alcoholic gastritis 03/2013    hospitalization   Dehydration 04/04/2013   Hematemesis 03/2013   along with alcoholic gastritis, hospitalization   Hematemesis- mild 04/04/2013   Intractable nausea, vomiting & abdominal pain. 04/04/2013   Metabolic acidosis 03/2013    hospitaliation due to alcohol abuse, dehyration, nausea/vomiting   Metabolic acidosis, increased anion gap 04/04/2013   Polysubstance abuse (HCC)    Pyelonephritis 03/2010   hospitalization   Seizures  (HCC)    Stomach ulcer    never had any tests to confirm this diagni=oses    Past Surgical History:  Procedure Laterality Date   BREAST LUMPECTOMY Right    benign pathology   CESAREAN SECTION  2005   MOUTH SURGERY      Family History  Problem Relation Age of Onset   Diabetes Mother    Hypertension Father    Diabetes Maternal Aunt    Diabetes Maternal Uncle    Cancer Maternal Uncle    Diabetes Maternal Grandmother    Cancer Maternal Grandmother    Diabetes Maternal Grandfather     Social History:  reports that she has been smoking cigarettes. She has never used smokeless tobacco. She reports current alcohol use. She reports current drug use. Drugs: Marijuana and Cocaine.  Allergies: No Known Allergies  Medications: Scheduled:  folic acid  1 mg Oral Daily   insulin aspart  0-6 Units Subcutaneous Q4H   multivitamin with minerals  1 tablet Oral Daily   pantoprazole (PROTONIX) IV  40 mg Intravenous Q12H   prochlorperazine  10 mg Intravenous Q6H   thiamine  100 mg Oral Daily   Or   thiamine  100 mg Intravenous Daily   Continuous:  sodium chloride 100 mL/hr at 09/09/23 0518   cefTRIAXone (ROCEPHIN)  IV 1 g (09/08/23 2143)    Results for orders placed or performed during the hospital encounter of 09/07/23 (from the past 24 hour(s))  Glucose, capillary     Status: None  Collection Time: 09/08/23  3:44 PM  Result Value Ref Range   Glucose-Capillary 85 70 - 99 mg/dL  Glucose, capillary     Status: None   Collection Time: 09/08/23 10:22 PM  Result Value Ref Range   Glucose-Capillary 90 70 - 99 mg/dL  Glucose, capillary     Status: Abnormal   Collection Time: 09/08/23 11:56 PM  Result Value Ref Range   Glucose-Capillary 109 (H) 70 - 99 mg/dL  Glucose, capillary     Status: None   Collection Time: 09/09/23  4:10 AM  Result Value Ref Range   Glucose-Capillary 90 70 - 99 mg/dL  Basic metabolic panel     Status: Abnormal   Collection Time: 09/09/23  5:47 AM  Result  Value Ref Range   Sodium 134 (L) 135 - 145 mmol/L   Potassium 3.2 (L) 3.5 - 5.1 mmol/L   Chloride 105 98 - 111 mmol/L   CO2 21 (L) 22 - 32 mmol/L   Glucose, Bld 99 70 - 99 mg/dL   BUN <5 (L) 6 - 20 mg/dL   Creatinine, Ser 1.61 (L) 0.44 - 1.00 mg/dL   Calcium 8.1 (L) 8.9 - 10.3 mg/dL   GFR, Estimated >09 >60 mL/min   Anion gap 8 5 - 15  CBC     Status: Abnormal   Collection Time: 09/09/23  5:47 AM  Result Value Ref Range   WBC 5.2 4.0 - 10.5 K/uL   RBC 3.21 (L) 3.87 - 5.11 MIL/uL   Hemoglobin 8.5 (L) 12.0 - 15.0 g/dL   HCT 45.4 (L) 09.8 - 11.9 %   MCV 84.7 80.0 - 100.0 fL   MCH 26.5 26.0 - 34.0 pg   MCHC 31.3 30.0 - 36.0 g/dL   RDW 14.7 (H) 82.9 - 56.2 %   Platelets 234 150 - 400 K/uL   nRBC 0.0 0.0 - 0.2 %  Glucose, capillary     Status: None   Collection Time: 09/09/23  7:25 AM  Result Value Ref Range   Glucose-Capillary 85 70 - 99 mg/dL  Glucose, capillary     Status: None   Collection Time: 09/09/23 11:37 AM  Result Value Ref Range   Glucose-Capillary 97 70 - 99 mg/dL     No results found.  ROS:  As stated above in the HPI otherwise negative.  Blood pressure 122/77, pulse 61, temperature 98.1 F (36.7 C), temperature source Oral, resp. rate 17, height 5\' 2"  (1.575 m), weight 63 kg, last menstrual period 08/27/2023, SpO2 100%, currently breastfeeding.    PE: Gen: NAD, Alert and Oriented HEENT:  [place]/AT, EOMI Neck: Supple, no LAD Lungs: CTA Bilaterally CV: RRR without M/G/R ABD: Soft, NTND, +BS Ext: No C/C/E  Assessment/Plan: 1) Chronic and intermittent nausea and vomiting. 2) Post-tussive vomiting. 3) GERD. 4) Marijuana abuse. 5) Anemia - chronic.   The patient warrants further evaluation with an EGD.  She did report being helped with using a PPI.  It may be that she needs to be on a PPI chronically.  She does use marijuana on a routine basis, but it does not appear that she has CHS, although she was encouraged to stop smoking marijuana as there may be some  component to her nausea and vomiting.  There is a noted anemia, but this appears chronic and she seems to be near her baseline.  She does not report any issues with hematemesis, hematochezia, or melena.    Plan: 1) EGD with Dr. Dulce Sellar tomorrow. 2) Continue with  pantoprazole. 3) IV hydration. 4) Antiemetics.  Kalese Ensz D 09/09/2023, 2:43 PM

## 2023-09-09 NOTE — TOC Initial Note (Signed)
Transition of Care [facility]) - Initial/Assessment Note   Patient Details  Name: [name] MRN: [id number] Date of Birth: 01/06/1986  Transition [facility] CM/SW Contact:    Ewing Schlein, LCSW Phone Number: 09/09/2023, 10:59 AM  Clinical Narrative: [facility] consulted for ETOH/SU resources. CSW spoke with patient regarding consult. Patient declined resources at this time.      Expected Discharge Plan: Home/Self Care Barriers to Discharge: Continued Medical Work up  Patient Goals and CMS Choice Patient states their goals for this hospitalization and ongoing recovery are:: Return home Choice offered to / list presented to : NA  Expected Discharge Plan and Services In-house Referral: Clinical Social Work Post Acute Care Choice: NA Living arrangements for the past 2 months: Apartment           DME Arranged: N/A DME Agency: NA  Prior Living Arrangements/Services Living arrangements for the past 2 months: Apartment Lives with:: Significant Other Patient language and need for interpreter reviewed:: Yes Do you feel safe going back to the place where you live?: Yes      Need for Family Participation in Patient Care: No (Comment) Care giver support system in place?: Yes (comment) Criminal Activity/Legal Involvement Pertinent to Current Situation/Hospitalization: No - Comment as needed  Activities of Daily Living Home Assistive Devices/Equipment: None ADL Screening (condition at time of admission) Patient's cognitive ability adequate to safely complete daily activities?: Yes Is the patient deaf or have difficulty hearing?: No Does the patient have difficulty seeing, even when wearing glasses/contacts?: No Does the patient have difficulty concentrating, remembering, or making decisions?: No Patient able to express need for assistance with ADLs?: Yes Does the patient have difficulty dressing or bathing?: No Independently performs ADLs?: Yes (appropriate for developmental age) Does the  patient have difficulty walking or climbing stairs?: No Weakness of Legs: None Weakness of Arms/Hands: None  Emotional Assessment Attitude/Demeanor/Rapport: Engaged Affect (typically observed): Appropriate Orientation: : Oriented to Self, Oriented to Place, Oriented to  Time, Oriented to Situation Alcohol / Substance Use: Alcohol Use, Illicit Drugs Psych Involvement: No (comment)  Admission diagnosis:  Hypokalemia [E87.6] Abnormal EKG [R94.31] Intractable nausea and vomiting [R11.2] Patient Active Problem List   Diagnosis Date Noted   Marijuana use 09/08/2023   Intractable nausea and vomiting 09/07/2023   Cough 09/07/2023   Hyperglycemia 09/07/2023   History of alcohol abuse 09/07/2023   UTI (urinary tract infection) 08/09/2022   Trichomoniasis 08/09/2022   Seizure (HCC) 08/08/2022   Hyponatremia 08/08/2022   QT prolongation 08/08/2022   Anemia 04/19/2013   Epigastric abdominal pain 04/17/2013   Hypokalemia 04/04/2013   Tobacco abuse 04/04/2013   Alcohol dependence (HCC) 04/04/2013   PCP:  Pcp, No Pharmacy:   [id number] [place]ann Sheen,  - [address] [place] [address] Phone: [phone] Fax: [phone]  Social Determinants of Health (SDOH) Social History: SDOH Screenings   Food Insecurity: Patient Declined (09/08/2023)  Housing: Patient Declined (09/08/2023)  Transportation Needs: Patient Declined (09/08/2023)  Utilities: Patient Declined (09/08/2023)  Tobacco Use: High Risk (09/07/2023)   SDOH Interventions:    Readmission Risk Interventions     No data to display

## 2023-09-10 ENCOUNTER — Encounter (HOSPITAL_COMMUNITY): Payer: Self-pay | Admitting: Internal Medicine

## 2023-09-10 ENCOUNTER — Inpatient Hospital Stay (HOSPITAL_COMMUNITY): Payer: No Typology Code available for payment source | Admitting: Anesthesiology

## 2023-09-10 ENCOUNTER — Inpatient Hospital Stay (HOSPITAL_COMMUNITY): Payer: Self-pay | Admitting: Anesthesiology

## 2023-09-10 ENCOUNTER — Encounter (HOSPITAL_COMMUNITY)
Admission: EM | Disposition: A | Discharge status: Home / Self Care | Payer: Self-pay | Source: Home / Self Care | Attending: Internal Medicine

## 2023-09-10 DIAGNOSIS — E876 Hypokalemia: Secondary | ICD-10-CM | POA: Diagnosis not present

## 2023-09-10 DIAGNOSIS — K229 Disease of esophagus, unspecified: Secondary | ICD-10-CM

## 2023-09-10 DIAGNOSIS — K2970 Gastritis, unspecified, without bleeding: Secondary | ICD-10-CM

## 2023-09-10 DIAGNOSIS — R739 Hyperglycemia, unspecified: Secondary | ICD-10-CM

## 2023-09-10 DIAGNOSIS — R112 Nausea with vomiting, unspecified: Secondary | ICD-10-CM | POA: Diagnosis not present

## 2023-09-10 DIAGNOSIS — R9431 Abnormal electrocardiogram [ECG] [EKG]: Secondary | ICD-10-CM | POA: Diagnosis not present

## 2023-09-10 HISTORY — PX: ESOPHAGOGASTRODUODENOSCOPY (EGD) WITH PROPOFOL: SHX5813

## 2023-09-10 HISTORY — PX: BIOPSY: SHX5522

## 2023-09-10 LAB — GLUCOSE, CAPILLARY
Glucose-Capillary: 103 mg/dL — ABNORMAL HIGH (ref 70–99)
Glucose-Capillary: 109 mg/dL — ABNORMAL HIGH (ref 70–99)
Glucose-Capillary: 79 mg/dL (ref 70–99)
Glucose-Capillary: 85 mg/dL (ref 70–99)
Glucose-Capillary: 87 mg/dL (ref 70–99)
Glucose-Capillary: 99 mg/dL (ref 70–99)

## 2023-09-10 LAB — CBC
HCT: 27.3 % — ABNORMAL LOW (ref 36.0–46.0)
Hemoglobin: 8.5 g/dL — ABNORMAL LOW (ref 12.0–15.0)
MCH: 26.6 pg (ref 26.0–34.0)
MCHC: 31.1 g/dL (ref 30.0–36.0)
MCV: 85.6 fL (ref 80.0–100.0)
Platelets: 231 10*3/uL (ref 150–400)
RBC: 3.19 MIL/uL — ABNORMAL LOW (ref 3.87–5.11)
RDW: 20.5 % — ABNORMAL HIGH (ref 11.5–15.5)
WBC: 5.8 10*3/uL (ref 4.0–10.5)
nRBC: 0 % (ref 0.0–0.2)

## 2023-09-10 LAB — BASIC METABOLIC PANEL
Anion gap: 7 (ref 5–15)
BUN: <5 mg/dL — ABNORMAL LOW (ref 6–20)
CO2: 24 mmol/L (ref 22–32)
Calcium: 8.4 mg/dL — ABNORMAL LOW (ref 8.9–10.3)
Chloride: 103 mmol/L (ref 98–111)
Creatinine, Ser: 0.49 mg/dL (ref 0.44–1.00)
GFR, Estimated: >60 mL/min (ref >60)
Glucose, Bld: 82 mg/dL (ref 70–99)
Potassium: 3.8 mmol/L (ref 3.5–5.1)
Sodium: 134 mmol/L — ABNORMAL LOW (ref 135–145)

## 2023-09-10 SURGERY — ESOPHAGOGASTRODUODENOSCOPY (EGD) WITH PROPOFOL
Anesthesia: Monitor Anesthesia Care

## 2023-09-10 MED ORDER — LACTATED RINGERS IV SOLN
INTRAVENOUS | Status: DC | PRN
Start: 2023-09-10 — End: 2023-09-10

## 2023-09-10 MED ORDER — SODIUM CHLORIDE 0.9 % IV SOLN: INTRAVENOUS | Status: DC

## 2023-09-10 MED ORDER — LIDOCAINE HCL (CARDIAC) PF 100 MG/5ML IV SOSY
PREFILLED_SYRINGE | INTRAVENOUS | Status: DC | PRN
  Administered 2023-09-10: 100 mg via INTRAVENOUS

## 2023-09-10 MED ORDER — FLUCONAZOLE IN SODIUM CHLORIDE 400-0.9 MG/200ML-% IV SOLN
400.0000 mg | Freq: Once | INTRAVENOUS | Status: AC
  Administered 2023-09-10: 400 mg via INTRAVENOUS
  Filled 2023-09-10: qty 200

## 2023-09-10 MED ORDER — PROPOFOL 10 MG/ML IV BOLUS
INTRAVENOUS | Status: AC
  Filled 2023-09-10: qty 20

## 2023-09-10 MED ORDER — PROPOFOL 500 MG/50ML IV EMUL
INTRAVENOUS | Status: DC | PRN
  Administered 2023-09-10: 150 ug/kg/min via INTRAVENOUS

## 2023-09-10 MED ORDER — PROCHLORPERAZINE EDISYLATE 10 MG/2ML IJ SOLN
10.0000 mg | INTRAMUSCULAR | Status: DC | PRN
  Administered 2023-09-10: 10 mg via INTRAVENOUS

## 2023-09-10 MED ORDER — DEXMEDETOMIDINE HCL IN NACL 80 MCG/20ML IV SOLN
INTRAVENOUS | Status: DC | PRN
Start: 2023-09-10 — End: 2023-09-10
  Administered 2023-09-10: 12 ug via INTRAVENOUS

## 2023-09-10 MED ORDER — PROPOFOL 10 MG/ML IV BOLUS
INTRAVENOUS | Status: DC | PRN
  Administered 2023-09-10: 100 mg via INTRAVENOUS

## 2023-09-10 MED ORDER — PROPOFOL 1000 MG/100ML IV EMUL
INTRAVENOUS | Status: AC
  Filled 2023-09-10: qty 100

## 2023-09-10 MED ORDER — GLYCOPYRROLATE 0.2 MG/ML IJ SOLN
INTRAMUSCULAR | Status: DC | PRN
  Administered 2023-09-10: .1 mg via INTRAVENOUS

## 2023-09-10 MED ORDER — FLUCONAZOLE IN SODIUM CHLORIDE 200-0.9 MG/100ML-% IV SOLN
200.0000 mg | INTRAVENOUS | Status: DC
  Administered 2023-09-11: 200 mg via INTRAVENOUS
  Filled 2023-09-10: qty 100

## 2023-09-10 SURGICAL SUPPLY — 15 items

## 2023-09-10 NOTE — Op Note (Signed)
[facility] Patient Name: [name] Procedure Date: 09/10/2023 MRN: [id number] Attending MD: Willis Modena , MD, [id number] Date of Birth: 1986-09-24 CSN: [id number] Age: 37 Admit Type: Inpatient Procedure:                Upper GI endoscopy Indications:              Nausea with vomiting Providers:                Willis Modena, MD, Doristine Mango, RN, Salley Scarlet, Technician, Eden Emms, CRNA Referring MD:             [facility] Medicines:                Monitored Anesthesia Care Complications:            No immediate complications. Estimated Blood Loss:     Estimated blood loss: none. Procedure:                Pre-Anesthesia Assessment:                           - Prior to the procedure, a History and Physical                            was performed, and patient medications and                            allergies were reviewed. The patient's tolerance of                            previous anesthesia was also reviewed. The risks                            and benefits of the procedure and the sedation                            options and risks were discussed with the patient.                            All questions were answered, and informed consent                            was obtained. Prior Anticoagulants: The patient has                            taken no anticoagulant or antiplatelet agents. ASA                            Grade Assessment: III - A patient with severe                            systemic disease. After reviewing the risks and  benefits, the patient was deemed in satisfactory                            condition to undergo the procedure.                           After obtaining informed consent, the endoscope was                            passed under direct vision. Throughout the                            procedure, the patient's blood pressure, pulse, and                             oxygen saturations were monitored continuously. The                            [id number]-[id number] ([id number] Olympus endoscope was introduced                            through the mouth, and advanced to the second part                            of duodenum. The upper GI endoscopy was                            accomplished without difficulty. The patient                            tolerated the procedure well. Scope In: Scope Out: Findings:      Patchy, white plaques were found in the lower third of the esophagus.       Biopsies were taken with a cold forceps for histology.      The exam of the esophagus was otherwise normal.      Patchy mild inflammation was found in the entire examined stomach.       Biopsies were taken with a cold forceps for histology.      The exam of the stomach was otherwise normal.      The duodenal bulb, first portion of the duodenum and second portion of       the duodenum were normal. Estimated blood loss: none. Impression:               - Esophageal plaques were found, consistent with                            candidiasis. Biopsied.                           - Gastritis. Biopsied.                           - Normal duodenal bulb, first portion of the  duodenum and second portion of the duodenum. Moderate Sedation:      Not Applicable - Patient had care per Anesthesia. Recommendation:           - Return patient to hospital ward for ongoing care.                           - Soft diet today.                           - Continue present medications.                           - Await pathology results. If candida confirmed                            would explore potential etiologies (HIV, other                            immunosuppression).                           [name] will follow. Procedure Code(s):        --- Professional ---                           [phone], Esophagogastroduodenoscopy, flexible,                             transoral; with biopsy, single or multiple Diagnosis Code(s):        --- Professional ---                           K22.9, Disease of esophagus, unspecified                           K29.70, Gastritis, unspecified, without bleeding                           R11.2, Nausea with vomiting, unspecified CPT copyright 2022 [employer]. All rights reserved. The codes documented in this report are preliminary and upon coder review may  be revised to meet current compliance requirements. Willis Modena, MD 09/10/2023 8:47:15 AM This report has been signed electronically. Number of Addenda: 0

## 2023-09-10 NOTE — Anesthesia Postprocedure Evaluation (Signed)
Anesthesia Post Note  Patient: [name]  Procedure(s) Performed: ESOPHAGOGASTRODUODENOSCOPY (EGD) WITH PROPOFOL BIOPSY     Patient location during evaluation: PACU Anesthesia Type: MAC Level of consciousness: awake and alert Pain management: pain level controlled Vital Signs Assessment: post-procedure vital signs reviewed and stable Respiratory status: spontaneous breathing, nonlabored ventilation, respiratory function stable and patient connected to nasal cannula oxygen Cardiovascular status: stable and blood pressure returned to baseline Postop Assessment: no apparent nausea or vomiting Anesthetic complications: no  No notable events documented.  Last Vitals:  Vitals:   09/10/23 0901 09/10/23 0910  BP: (!) 101/55 (!) 99/56  Pulse: (!) 50 (!) 50  Resp: 20 15  Temp:  (!) 36.3 C  SpO2: 100% 98%    Last Pain:  Vitals:   09/10/23 0910  TempSrc:   PainSc: 0-No pain                 Kennieth Rad

## 2023-09-10 NOTE — Plan of Care (Signed)
  Problem: Education: Goal: Ability to describe self-care measures that may prevent or decrease complications (Diabetes Survival Skills Education) will improve Outcome: Progressing   Problem: Coping: Goal: Ability to adjust to condition or change in health will improve Outcome: Progressing   Problem: Nutritional: Goal: Maintenance of adequate nutrition will improve Outcome: Progressing   Problem: Nutrition: Goal: Adequate nutrition will be maintained Outcome: Not Progressing

## 2023-09-10 NOTE — Plan of Care (Signed)
  Problem: Fluid Volume: Goal: Ability to maintain a balanced intake and output will improve Outcome: Progressing   Problem: Health Behavior/Discharge Planning: Goal: Ability to identify and utilize available resources and services will improve Outcome: Progressing   Problem: Metabolic: Goal: Ability to maintain appropriate glucose levels will improve Outcome: Progressing   Problem: Nutritional: Goal: Maintenance of adequate nutrition will improve Outcome: Progressing   Problem: Tissue Perfusion: Goal: Adequacy of tissue perfusion will improve Outcome: Progressing   Problem: Clinical Measurements: Goal: Will remain free from infection Outcome: Progressing

## 2023-09-10 NOTE — Progress Notes (Signed)
[facility]                                                                              [name], is a 37 y.o. 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  Patient was seen in ED 2 days prior for complaints of chest pain, cough, nausea and vomiting, tested negative for COVID with negative chest x-ray.  She was discharged with prescription for Tessalon and ibuprofen.  Patient returned to ED with epigastric abdominal pain, vomiting, cough, not able to tolerate p.o. intake.  No hematemesis melena or hematochezia. Has cut down on the drinking however does smoke marijuana and cigarettes. No leukocytosis.  Mildly elevated transaminases.  UA positive for UTI.    Assessment & Plan      Intractable nausea and vomiting Epigastric abdominal pain ?  Candida esophagitis, gastritis -Mildly elevated transaminases, also noted on previous labs, lipase normal, serum hCG negative. -Continue IV PPI, IV fluid hydration, pain management and antiemetics -EGD showed esophageal plaques consistent with candidiasis, biopsied.  Gastritis  -Continue PPI, advance to soft diet, changed to as needed Compazine -Follow biopsy results, HIV negative.  Started on Diflucan 400 mg IV x 1, continue 200 mg daily thereafter for 14-21 days.  History of marijuana use -Nausea/vomiting could be related to marijuana use, counseled on cessation -Continue symptomatic management for nausea and vomiting    UTI -UA positive for UTI,  -Urine culture showed no growth.  Blood cultures negative -Received IV Rocephin, completed 3  doses   Hypokalemia QT prolongation -Currently stable, potassium 3.8   Mild hyponatremia -Likely due to #1, continue IVF, improving   Hypokalemia -Replaced   Cough -No shortness of breath, tachypnea, hypoxia or fevers.  Improving  -Chest x-ray negative for pneumonia, COVID-negative  -Continue Tessalon Perles   Hyperglycemia -CBG 175 on admission, no documented history of diabetes -Hemoglobin A1c 5.5 -Continue SSI   History of alcohol abuse - Patient denies current alcohol use.  - Continue to monitor closely for any withdrawals.  Placed on CIWA monitoring with Ativan.   Estimated body mass index is 25.4 kg/m as calculated from the following:   Height as of this encounter: 5\' 2"  (1.575 m).   Weight as of this encounter: 63 kg.  Code Status: Full code DVT Prophylaxis:  SCDs Start: 09/07/23 2029   Level of Care: Level of care: Telemetry Family Communication: Updated patient's mother, fianc at the bedside Disposition Plan:      Remains inpatient appropriate: Plan for DC home in a.m. if tolerating diet, no acute issues   Procedures:  EGD   Consultants:   GI  Antimicrobials:   Anti-infectives (From admission, onward)    Start     Dose/Rate Route Frequency Ordered  Stop   09/11/23 1000  fluconazole (DIFLUCAN) IVPB 200 mg        200 mg 100 mL/hr over 60 Minutes Intravenous Every 24 hours 09/10/23 1053     09/10/23 1145  fluconazole (DIFLUCAN) IVPB 400 mg        400 mg 100 mL/hr over 120 Minutes Intravenous  Once 09/10/23 1053     09/07/23 2100  cefTRIAXone (ROCEPHIN) 1 g in sodium chloride 0.9 % 100 mL IVPB  Status:  Discontinued        1 g 200 mL/hr over 30 Minutes Intravenous Every 24 hours 09/07/23 2031 09/10/23 1053          Medications  benzonatate  200 mg Oral TID   folic acid  1 mg Oral Daily   insulin aspart  0-6 Units Subcutaneous Q4H   multivitamin with minerals  1 tablet Oral Daily   pantoprazole (PROTONIX) IV  40 mg Intravenous Q12H    thiamine  100 mg Oral Daily   Or   thiamine  100 mg Intravenous Daily      Subjective:   [name] was seen and examined today.  Seen after EGD, no acute nausea vomiting, chest pain or shortness of breath.  Family members at the bedside.   Objective:   Vitals:   09/10/23 0845 09/10/23 0901 09/10/23 0910 09/10/23 1039  BP: 116/68 (!) 101/55 (!) 99/56 (!) 103/58  Pulse: 61 (!) 50 (!) 50 68  Resp: 20 20 15 16   Temp:   (!) 97.3 F (36.3 C) 98.1 F (36.7 C)  TempSrc:      SpO2: 100% 100% 98% 100%  Weight:      Height:        Intake/Output Summary (Last 24 hours) at 09/10/2023 1357 Last data filed at 09/10/2023 0837 Gross per 24 hour  Intake 350 ml  Output --  Net 350 ml     Wt Readings from Last 3 Encounters:  09/07/23 63 kg  09/04/23 63 kg  07/04/23 63 kg   Physical Exam General: Alert and oriented x 3, NAD Cardiovascular: S1 S2 clear, RRR.  Respiratory: CTAB, no wheezing Gastrointestinal: Soft, nontender, nondistended, NBS Ext: no pedal edema bilaterally Neuro: no new deficits Psych: Normal affect   Data Reviewed:  I have personally reviewed following labs    CBC Lab Results  Component Value Date   WBC 5.8 09/10/2023   RBC 3.19 (L) 09/10/2023   HGB 8.5 (L) 09/10/2023   HCT 27.3 (L) 09/10/2023   MCV 85.6 09/10/2023   MCH 26.6 09/10/2023   PLT 231 09/10/2023   MCHC 31.1 09/10/2023   RDW 20.5 (H) 09/10/2023   LYMPHSABS 1.0 07/04/2023   MONOABS 0.5 07/04/2023   EOSABS 0.1 07/04/2023   BASOSABS 0.0 07/04/2023     Last metabolic panel Lab Results  Component Value Date   NA 134 (L) 09/10/2023   K 3.8 09/10/2023   CL 103 09/10/2023   CO2 24 09/10/2023   BUN <5 (L) 09/10/2023   CREATININE 0.49 09/10/2023   GLUCOSE 82 09/10/2023   GFRNONAA >60 09/10/2023   GFRAA >60 12/13/2018   CALCIUM 8.4 (L) 09/10/2023   PHOS 2.6 08/08/2022   PROT 6.8 09/08/2023   ALBUMIN 3.6 09/08/2023   BILITOT 0.6 09/08/2023   ALKPHOS 61 09/08/2023   AST 48 (H)  09/08/2023   ALT 42 09/08/2023   ANIONGAP 7 09/10/2023    CBG (last 3)  Recent Labs    09/10/23 0411 09/10/23 0707 09/10/23  1144  GLUCAP 79 87 109*      Coagulation Profile: No results for input(s): "INR", "PROTIME" in the last 168 hours.   Radiology Studies: I have personally reviewed the imaging studies  No results found.     Thad Ranger M.D. [facility] 09/10/2023, 1:57 PM  Available via Epic secure chat 7am-7pm After 7 pm, please refer to night coverage provider listed on amion.

## 2023-09-10 NOTE — Interval H&P Note (Signed)
History and Physical Interval Note:  09/10/2023 8:07 AM  [name]  has presented today for surgery, with the diagnosis of Nausea and vomiting.  The various methods of treatment have been discussed with the patient and family. After consideration of risks, benefits and other options for treatment, the patient has consented to  Procedure(s): ESOPHAGOGASTRODUODENOSCOPY (EGD) WITH PROPOFOL (N/A) as a surgical intervention.  The patient's history has been reviewed, patient examined, no change in status, stable for surgery.  I have reviewed the patient's chart and labs.  Questions were answered to the patient's satisfaction.     Freddy Jaksch

## 2023-09-10 NOTE — Anesthesia Preprocedure Evaluation (Signed)
Anesthesia Evaluation  Patient identified by MRN, date of birth, ID band Patient awake    Reviewed: Allergy & Precautions, NPO status , Patient's Chart, lab work & pertinent test results  Airway Mallampati: II  TM Distance: >3 FB Neck ROM: Full    Dental  (+) Dental Advisory Given   Pulmonary Current Smoker   breath sounds clear to auscultation       Cardiovascular  Rhythm:Regular Rate:Normal     Neuro/Psych negative neurological ROS     GI/Hepatic PUD,,,(+)     substance abuse  alcohol use  Endo/Other  negative endocrine ROS    Renal/GU negative Renal ROS     Musculoskeletal   Abdominal   Peds  Hematology  (+) Blood dyscrasia, anemia   Anesthesia Other Findings   Reproductive/Obstetrics                             Anesthesia Physical Anesthesia Plan  ASA: 3  Anesthesia Plan: MAC   Post-op Pain Management:    Induction:   PONV Risk Score and Plan: 1 and Propofol infusion, Ondansetron and Treatment may vary due to age or medical condition  Airway Management Planned: Natural Airway and Nasal Cannula  Additional Equipment:   Intra-op Plan:   Post-operative Plan:   Informed Consent: I have reviewed the patients History and Physical, chart, labs and discussed the procedure including the risks, benefits and alternatives for the proposed anesthesia with the patient or authorized representative who has indicated his/her understanding and acceptance.       Plan Discussed with: CRNA  Anesthesia Plan Comments:        Anesthesia Quick Evaluation

## 2023-09-10 NOTE — Transfer of Care (Signed)
Immediate Anesthesia Transfer of Care Note  Patient: [name]  Procedure(s) Performed: ESOPHAGOGASTRODUODENOSCOPY (EGD) WITH PROPOFOL BIOPSY  Patient Location: PACU  Anesthesia Type:MAC  Level of Consciousness: drowsy and patient cooperative  Airway & Oxygen Therapy: Patient Spontanous Breathing and Patient connected to nasal cannula oxygen  Post-op Assessment: Report given to RN and Post -op Vital signs reviewed and stable  Post vital signs: Reviewed and stable  Last Vitals:  Vitals Value Taken Time  BP 116/68 09/10/23 0845  Temp 36.1 C 09/10/23 0843  Pulse 56 09/10/23 0846  Resp 21 09/10/23 0846  SpO2 100 % 09/10/23 0846  Vitals shown include unfiled device data.  Last Pain:  Vitals:   09/10/23 0843  TempSrc:   PainSc: 0-No pain         Complications: No notable events documented.

## 2023-09-11 DIAGNOSIS — R112 Nausea with vomiting, unspecified: Secondary | ICD-10-CM | POA: Diagnosis not present

## 2023-09-11 DIAGNOSIS — E876 Hypokalemia: Secondary | ICD-10-CM | POA: Diagnosis not present

## 2023-09-11 DIAGNOSIS — R9431 Abnormal electrocardiogram [ECG] [EKG]: Secondary | ICD-10-CM | POA: Diagnosis not present

## 2023-09-11 LAB — CBC
HCT: 33.8 % — ABNORMAL LOW (ref 36.0–46.0)
Hemoglobin: 10.3 g/dL — ABNORMAL LOW (ref 12.0–15.0)
MCH: 26.9 pg (ref 26.0–34.0)
MCHC: 30.5 g/dL (ref 30.0–36.0)
MCV: 88.3 fL (ref 80.0–100.0)
Platelets: 207 10*3/uL (ref 150–400)
RBC: 3.83 MIL/uL — ABNORMAL LOW (ref 3.87–5.11)
RDW: 21.3 % — ABNORMAL HIGH (ref 11.5–15.5)
WBC: 6.4 10*3/uL (ref 4.0–10.5)
nRBC: 0 % (ref 0.0–0.2)

## 2023-09-11 LAB — BASIC METABOLIC PANEL
Anion gap: 9 (ref 5–15)
BUN: <5 mg/dL — ABNORMAL LOW (ref 6–20)
CO2: 23 mmol/L (ref 22–32)
Calcium: 8.7 mg/dL — ABNORMAL LOW (ref 8.9–10.3)
Chloride: 103 mmol/L (ref 98–111)
Creatinine, Ser: 0.51 mg/dL (ref 0.44–1.00)
GFR, Estimated: >60 mL/min (ref >60)
Glucose, Bld: 87 mg/dL (ref 70–99)
Potassium: 3.7 mmol/L (ref 3.5–5.1)
Sodium: 135 mmol/L (ref 135–145)

## 2023-09-11 LAB — MAGNESIUM: Magnesium: 1.6 mg/dL — ABNORMAL LOW (ref 1.7–2.4)

## 2023-09-11 LAB — GLUCOSE, CAPILLARY
Glucose-Capillary: 90 mg/dL (ref 70–99)
Glucose-Capillary: 120 mg/dL — ABNORMAL HIGH (ref 70–99)
Glucose-Capillary: 87 mg/dL (ref 70–99)

## 2023-09-11 MED ORDER — PROCHLORPERAZINE MALEATE 10 MG PO TABS: 10.0000 mg | ORAL_TABLET | Freq: Four times a day (QID) | ORAL | 0 refills | Status: DC | PRN

## 2023-09-11 MED ORDER — MAGNESIUM OXIDE -MG SUPPLEMENT 400 (240 MG) MG PO TABS
400.0000 mg | ORAL_TABLET | Freq: Two times a day (BID) | ORAL | Status: DC
  Administered 2023-09-11: 400 mg via ORAL
  Filled 2023-09-11: qty 1

## 2023-09-11 MED ORDER — GUAIFENESIN-DM 100-10 MG/5ML PO SYRP: 5.0000 mL | ORAL_SOLUTION | ORAL | 0 refills | Status: DC | PRN

## 2023-09-11 MED ORDER — BENZONATATE 200 MG PO CAPS: 200.0000 mg | ORAL_CAPSULE | Freq: Three times a day (TID) | ORAL | 0 refills | Status: DC | PRN

## 2023-09-11 MED ORDER — PANTOPRAZOLE SODIUM 40 MG PO TBEC: 40.0000 mg | DELAYED_RELEASE_TABLET | Freq: Two times a day (BID) | ORAL | 1 refills | Status: DC

## 2023-09-11 MED ORDER — FLUCONAZOLE 100 MG PO TABS: 100.0000 mg | ORAL_TABLET | Freq: Every day | ORAL | 0 refills | Status: AC

## 2023-09-11 MED ORDER — MAGNESIUM OXIDE -MG SUPPLEMENT 400 (240 MG) MG PO TABS: 400.0000 mg | ORAL_TABLET | Freq: Two times a day (BID) | ORAL | 0 refills | Status: AC

## 2023-09-11 NOTE — Plan of Care (Signed)
  Problem: Education: Goal: Ability to describe self-care measures that may prevent or decrease complications (Diabetes Survival Skills Education) will improve Outcome: Progressing   Problem: Coping: Goal: Ability to adjust to condition or change in health will improve Outcome: Progressing   Problem: Health Behavior/Discharge Planning: Goal: Ability to manage health-related needs will improve Outcome: Progressing   Problem: Metabolic: Goal: Ability to maintain appropriate glucose levels will improve Outcome: Progressing   Problem: Nutritional: Goal: Maintenance of adequate nutrition will improve Outcome: Progressing   Problem: Nutritional: Goal: Maintenance of adequate nutrition will improve Outcome: Progressing   Problem: Education: Goal: Knowledge of General Education information will improve Description: Including pain rating scale, medication(s)/side effects and non-pharmacologic comfort measures Outcome: Progressing   Problem: Clinical Measurements: Goal: Ability to maintain clinical measurements within normal limits will improve Outcome: Progressing Goal: Will remain free from infection Outcome: Progressing Goal: Diagnostic test results will improve Outcome: Progressing   Problem: Pain Managment: Goal: General experience of comfort will improve Outcome: Progressing

## 2023-09-11 NOTE — Discharge Summary (Signed)
Physician Discharge Summary   Patient: [name] MRN: [id number] DOB: 1986-04-10  Admit date:     09/07/2023  Discharge date: 09/11/23  Discharge Physician: Thad Ranger, MD    PCP: Pcp, No   Recommendations at discharge:   Continue fluconazole 100 mg daily for 14 days Protonix 40 mg twice daily  Discharge Diagnoses:    Intractable nausea and vomiting Candida esophagitis Acute gastritis   Hypokalemia   Epigastric abdominal pain   Hyponatremia   QT prolongation   UTI (urinary tract infection)   Cough   History of alcohol abuse   Marijuana use    Hospital Course: Patient is a 37 year old female with history of alcoholic gastritis, upper GI bleed, anemia, polysubstance abuse, pyelonephritis, 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  Patient was seen in ED 2 days prior for complaints of chest pain, cough, nausea and vomiting, tested negative for COVID with negative chest x-ray.  She was discharged with prescription for Tessalon and ibuprofen.  Patient returned to ED with epigastric abdominal pain, vomiting, cough, not able to tolerate p.o. intake.  No hematemesis melena or hematochezia. Has cut down on the drinking however does smoke marijuana and cigarettes. No leukocytosis.  Mildly elevated transaminases.  UA positive for UT  Assessment and Plan:   Intractable nausea and vomiting Epigastric abdominal pain ?  Candida esophagitis, gastritis -Mildly elevated transaminases, also noted on previous labs, lipase normal, serum hCG negative. -Patient was placed on IV fluids, IV PPI, pain management and antiemetics -GI was consulted, EGD showed esophageal plaques consistent with candidiasis, biopsied.  Gastritis  -Biopsy will be followed by GI, Dr. Dulce Sellar -HIV negative.  Started on oral Diflucan 100 mg daily for 14 days, Protonix 40 mg twice daily   History of marijuana use -Nausea/vomiting could be related to marijuana use, counseled on  cessation -Continue symptomatic management for nausea and vomiting    UTI -UA positive for UTI,  -Urine culture showed no growth.  Blood cultures negative -Received IV Rocephin, completed 3 doses   Hypokalemia QT prolongation -Currently stable, potassium 3.8   Mild hyponatremia -Likely due to #1 -Resolved, sodium 135 at discharge, tolerating diet without difficulty   Hypokalemia -Replaced   Cough -No shortness of breath, tachypnea, hypoxia or fevers.  Improving  -Chest x-ray negative for pneumonia, COVID-negative  -Continue Tessalon Perles   Hyperglycemia -CBG 175 on admission, no documented history of diabetes -Hemoglobin A1c 5.5      History of alcohol abuse - Patient denies current alcohol use.  -No acute withdrawals     Estimated body mass index is 25.4 kg/m as calculated from the following:   Height as of this encounter: 5\' 2"  (1.575 m).   Weight as of this encounter: 63 kg.     Pain control - [employer] Controlled Substance Reporting System database was reviewed. and patient was instructed, not to drive, operate heavy machinery, perform activities at heights, swimming or participation in water activities or provide baby-sitting services while on Pain, Sleep and Anxiety Medications; until their outpatient Physician has advised to do so again. Also recommended to not to take more than prescribed Pain, Sleep and Anxiety Medications.  Consultants: GI  Procedures performed: EGD Disposition: Home Diet recommendation:  Discharge Diet Orders (From admission, onward)     Start     Ordered   09/11/23 0000  Diet - low sodium heart healthy        09/11/23 1056  DISCHARGE MEDICATION: Allergies as of 09/11/2023   No Known Allergies      Medication List     STOP taking these medications    [facility] Tussin Adult Chest Congest 100 MG/5ML liquid Generic drug: guaiFENesin   ibuprofen 800 MG tablet Commonly known as: ADVIL   ondansetron 4 MG  disintegrating tablet Commonly known as: ZOFRAN-ODT   sucralfate 1 g tablet Commonly known as: Carafate       TAKE these medications    acetaminophen 500 MG tablet Commonly known as: TYLENOL Take 1,000 mg by mouth every 6 (six) hours as needed (for headaches).   benzonatate 200 MG capsule Commonly known as: TESSALON Take 1 capsule (200 mg total) by mouth 3 (three) times daily as needed for cough. What changed:  medication strength how much to take when to take this reasons to take this   Elderberry Immune Health Gummy 50-45-3.8 MG Chew Generic drug: Elderberry-Vitamin C-Zinc Chew 1-2 tablets by mouth daily.   fluconazole 100 MG tablet Commonly known as: Diflucan Take 1 tablet (100 mg total) by mouth daily for 14 days. Start taking on: September 12, 2023   guaiFENesin-dextromethorphan 100-10 MG/5ML syrup Commonly known as: ROBITUSSIN DM Take 5 mLs by mouth every 4 (four) hours as needed for cough.   magnesium oxide 400 (240 Mg) MG tablet Commonly known as: MAG-OX Take 1 tablet (400 mg total) by mouth 2 (two) times daily for 7 days.   One-A-Day Womens Formula Tabs Take 1 tablet by mouth daily with breakfast.   pantoprazole 40 MG tablet Commonly known as: Protonix Take 1 tablet (40 mg total) by mouth 2 (two) times daily before a meal. What changed: when to take this   prochlorperazine 10 MG tablet Commonly known as: COMPAZINE Take 1 tablet (10 mg total) by mouth every 6 (six) hours as needed for nausea or vomiting.        Follow-up Information     Willis Modena, MD. Schedule an appointment as soon as possible for a visit in 2 week(s).   Specialty: Gastroenterology Why: for hospital follow-up Contact information: [address] [place] [address] [phone]                Discharge Exam: Ceasar Mons Weights   09/07/23 1425  Weight: 63 kg   S: No acute complaints, tolerating diet.  Looking forward to go home today.  Cleared by  GI.  BP 137/76 (BP Location: Right Arm)   Pulse (!) 51   Temp 98.7 F (37.1 C)   Resp 19   Ht 5\' 2"  (1.575 m)   Wt 63 kg   LMP 08/27/2023   SpO2 100%   Breastfeeding No   BMI 25.40 kg/m    Physical Exam General: Alert and oriented x 3, NAD Cardiovascular: S1 S2 clear, RRR.  Respiratory: CTAB, no wheezing, rales or rhonchi Gastrointestinal: Soft, nontender, nondistended, NBS Ext: no pedal edema bilaterally Neuro: no new deficits Skin: No rashes Psych: Normal affect and demeanor, alert and oriented x3    Condition at discharge: fair  The results of significant diagnostics from this hospitalization (including imaging, microbiology, ancillary and laboratory) are listed below for reference.   Imaging Studies: DG Chest 2 View  Result Date: 09/04/2023 CLINICAL DATA:  Chest pain EXAM: CHEST - 2 VIEW COMPARISON:  Chest x-ray 07/04/2023 FINDINGS: The heart size and mediastinal contours are within normal limits. Both lungs are clear. The visualized skeletal structures are unremarkable. IMPRESSION: No active cardiopulmonary disease. Electronically  Signed   By: Darliss Cheney M.D.   On: 09/04/2023 22:44    Microbiology: Results for orders placed or performed during the hospital encounter of 09/04/23  SARS Coronavirus 2 by RT PCR (hospital order, performed in [facility] hospital lab) *cepheid single result test* Anterior Nasal Swab     Status: None   Collection Time: 09/04/23 11:13 PM   Specimen: Anterior Nasal Swab  Result Value Ref Range Status   SARS Coronavirus 2 by RT PCR NEGATIVE NEGATIVE Final    Comment: (NOTE) SARS-CoV-2 target nucleic acids are NOT DETECTED.  The SARS-CoV-2 RNA is generally detectable in upper and lower respiratory specimens during the acute phase of infection. The lowest concentration of SARS-CoV-2 viral copies this assay can detect is 250 copies / mL. A negative result does not preclude SARS-CoV-2 infection and should not be used as the sole basis for  treatment or other patient management decisions.  A negative result may occur with improper specimen collection / handling, submission of specimen other than nasopharyngeal swab, presence of viral mutation(s) within the areas targeted by this assay, and inadequate number of viral copies (<250 copies / mL). A negative result must be combined with clinical observations, patient history, and epidemiological information.  Fact Sheet for Patients:   RoadLapTop.co.za  Fact Sheet for Healthcare Providers: http://kim-miller.com/  This test is not yet approved or  cleared by the [place] FDA and has been authorized for detection and/or diagnosis of SARS-CoV-2 by FDA under an Emergency Use Authorization (EUA).  This EUA will remain in effect (meaning this test can be used) for the duration of the COVID-19 declaration under Section 564(b)(1) of the Act, 21 U.S.C. section 360bbb-3(b)(1), unless the authorization is terminated or revoked sooner.  Performed at [facility], [address], [place], [place] [address]     Labs: CBC: Recent Labs  Lab 09/07/23 1434 09/08/23 0723 09/09/23 0547 09/10/23 0616 09/11/23 0533  WBC 9.3 9.7 5.2 5.8 6.4  HGB 12.1 9.6* 8.5* 8.5* 10.3*  HCT 38.0 30.5* 27.2* 27.3* 33.8*  MCV 79.5* 83.1 84.7 85.6 88.3  PLT 378 286 234 231 207   Basic Metabolic Panel: Recent Labs  Lab 09/07/23 1434 09/08/23 0723 09/09/23 0547 09/10/23 0616 09/11/23 0533  NA 131* 132* 134* 134* 135  K 2.7* 3.5 3.2* 3.8 3.7  CL 90* 102 105 103 103  CO2 26 21* 21* 24 23  GLUCOSE 175* 93 99 82 87  BUN 10 6 <5* <5* <5*  CREATININE 0.78 0.54 0.40* 0.49 0.51  CALCIUM 9.9 8.2* 8.1* 8.4* 8.7*  MG 2.0 1.6*  --   --  1.6*   Liver Function Tests: Recent Labs  Lab 09/07/23 1434 09/08/23 0723  AST 47* 48*  ALT 51* 42  ALKPHOS 86 61  BILITOT 0.7 0.6  PROT 9.7* 6.8  ALBUMIN 4.9 3.6   CBG: Recent Labs  Lab  09/10/23 1937 09/10/23 2356 09/11/23 0407 09/11/23 1001 09/11/23 1126  GLUCAP 103* 99 90 120* 87    Discharge time spent: greater than 30 minutes.  Signed: Thad Ranger, MD [facility] 09/11/2023

## 2023-09-11 NOTE — Progress Notes (Signed)
Subjective: Nausea and vomiting resolved. Tolerating diet. No abdominal pain.  Objective: Vital signs in last 24 hours: Temp:  [98.1 F (36.7 C)-99.2 F (37.3 C)] 99.2 F (37.3 C) (09/15 0411) Pulse Rate:  [51-68] 51 (09/15 0411) Resp:  [16-20] 18 (09/15 0411) BP: (103-125)/(58-80) 107/63 (09/15 0411) SpO2:  [98 %-100 %] 98 % (09/15 0411) Weight change:  Last BM Date : 09/09/23  PE: GEN:  NAD HEENT:  No encephalopathy  Lab Results: CBC    Component Value Date/Time   WBC 6.4 09/11/2023 0533   RBC 3.83 (L) 09/11/2023 0533   HGB 10.3 (L) 09/11/2023 0533   HGB 12.8 10/29/2013 0000   HCT 33.8 (L) 09/11/2023 0533   HCT 39 10/29/2013 0000   PLT 207 09/11/2023 0533   PLT 357 10/29/2013 0000   MCV 88.3 09/11/2023 0533   MCH 26.9 09/11/2023 0533   MCHC 30.5 09/11/2023 0533   RDW 21.3 (H) 09/11/2023 0533   LYMPHSABS 1.0 07/04/2023 0844   MONOABS 0.5 07/04/2023 0844   EOSABS 0.1 07/04/2023 0844   BASOSABS 0.0 07/04/2023 0844  CMP     Component Value Date/Time   NA 135 09/11/2023 0533   K 3.7 09/11/2023 0533   CL 103 09/11/2023 0533   CO2 23 09/11/2023 0533   GLUCOSE 87 09/11/2023 0533   BUN <5 (L) 09/11/2023 0533   CREATININE 0.51 09/11/2023 0533   CALCIUM 8.7 (L) 09/11/2023 0533   PROT 6.8 09/08/2023 0723   ALBUMIN 3.6 09/08/2023 0723   AST 48 (H) 09/08/2023 0723   ALT 42 09/08/2023 0723   ALKPHOS 61 09/08/2023 0723   BILITOT 0.6 09/08/2023 0723   GFRNONAA >60 09/11/2023 0533   Assessment:   Nausea and vomiting, much improved. Suspected candida esophagitis. Gastritis.  Plan:   OK to discharge home today from GI perspective on fluconazole 100 mg po every day x 14 days and pantoprazole 40 mg po bid. Soft bland diet for next few days, liberalize as tolerated. Follow-up EGD biopsies as outpatient. We will arrange outpatient GI follow-up; we will sign-off; please call with questions; thank you for the consultation.   [name] 09/11/2023, 10:36  AM   Cell [phone] If no answer or after 5 PM call [phone]

## 2023-09-12 ENCOUNTER — Encounter (HOSPITAL_COMMUNITY): Payer: Self-pay | Admitting: Gastroenterology

## 2023-09-12 LAB — GLUCOSE, CAPILLARY: Glucose-Capillary: 85 mg/dL (ref 70–99)

## 2023-09-13 LAB — SURGICAL PATHOLOGY

## 2023-11-16 ENCOUNTER — Encounter: Payer: Self-pay | Admitting: Emergency Medicine

## 2023-11-16 ENCOUNTER — Other Ambulatory Visit: Payer: Self-pay

## 2023-11-16 ENCOUNTER — Encounter (HOSPITAL_COMMUNITY): Payer: Self-pay

## 2023-11-16 ENCOUNTER — Ambulatory Visit
Admission: EM | Admit: 2023-11-16 | Discharge: 2023-11-16 | Disposition: A | Discharge status: Home / Self Care | Payer: No Typology Code available for payment source | Source: Home / Self Care | Attending: Family Medicine | Admitting: Family Medicine

## 2023-11-16 ENCOUNTER — Emergency Department (HOSPITAL_COMMUNITY): Payer: No Typology Code available for payment source

## 2023-11-16 ENCOUNTER — Emergency Department (HOSPITAL_COMMUNITY)
Admission: EM | Admit: 2023-11-16 | Discharge: 2023-11-17 | Disposition: A | Discharge status: Home / Self Care | Payer: No Typology Code available for payment source | Attending: Family Medicine | Admitting: Family Medicine

## 2023-11-16 DIAGNOSIS — R531 Weakness: Secondary | ICD-10-CM | POA: Diagnosis not present

## 2023-11-16 DIAGNOSIS — K760 Fatty (change of) liver, not elsewhere classified: Secondary | ICD-10-CM | POA: Diagnosis not present

## 2023-11-16 DIAGNOSIS — R059 Cough, unspecified: Secondary | ICD-10-CM | POA: Insufficient documentation

## 2023-11-16 DIAGNOSIS — J029 Acute pharyngitis, unspecified: Secondary | ICD-10-CM | POA: Insufficient documentation

## 2023-11-16 DIAGNOSIS — R9431 Abnormal electrocardiogram [ECG] [EKG]: Secondary | ICD-10-CM | POA: Insufficient documentation

## 2023-11-16 DIAGNOSIS — R5383 Other fatigue: Secondary | ICD-10-CM | POA: Insufficient documentation

## 2023-11-16 DIAGNOSIS — R112 Nausea with vomiting, unspecified: Secondary | ICD-10-CM | POA: Diagnosis not present

## 2023-11-16 DIAGNOSIS — N390 Urinary tract infection, site not specified: Secondary | ICD-10-CM | POA: Diagnosis not present

## 2023-11-16 DIAGNOSIS — R6883 Chills (without fever): Secondary | ICD-10-CM | POA: Insufficient documentation

## 2023-11-16 DIAGNOSIS — R1030 Lower abdominal pain, unspecified: Secondary | ICD-10-CM | POA: Insufficient documentation

## 2023-11-16 DIAGNOSIS — N3090 Cystitis, unspecified without hematuria: Secondary | ICD-10-CM

## 2023-11-16 DIAGNOSIS — R109 Unspecified abdominal pain: Secondary | ICD-10-CM | POA: Insufficient documentation

## 2023-11-16 LAB — HCG, SERUM, QUALITATIVE: Preg, Serum: NEGATIVE

## 2023-11-16 LAB — CBC
HCT: 34.7 % — ABNORMAL LOW (ref 36.0–46.0)
Hemoglobin: 11.6 g/dL — ABNORMAL LOW (ref 12.0–15.0)
MCH: 26.9 pg (ref 26.0–34.0)
MCHC: 33.4 g/dL (ref 30.0–36.0)
MCV: 80.3 fL (ref 80.0–100.0)
Platelets: 367 10*3/uL (ref 150–400)
RBC: 4.32 MIL/uL (ref 3.87–5.11)
RDW: 21.4 % — ABNORMAL HIGH (ref 11.5–15.5)
WBC: 10.3 10*3/uL (ref 4.0–10.5)
nRBC: 0 % (ref 0.0–0.2)

## 2023-11-16 LAB — COMPREHENSIVE METABOLIC PANEL
ALT: 28 U/L (ref 0–44)
AST: 38 U/L (ref 15–41)
Albumin: 5.2 g/dL — ABNORMAL HIGH (ref 3.5–5.0)
Alkaline Phosphatase: 91 U/L (ref 38–126)
Anion gap: 13 (ref 5–15)
BUN: 9 mg/dL (ref 6–20)
CO2: 25 mmol/L (ref 22–32)
Calcium: 10.1 mg/dL (ref 8.9–10.3)
Chloride: 94 mmol/L — ABNORMAL LOW (ref 98–111)
Creatinine, Ser: 0.75 mg/dL (ref 0.44–1.00)
GFR, Estimated: >60 mL/min (ref >60)
Glucose, Bld: 138 mg/dL — ABNORMAL HIGH (ref 70–99)
Potassium: 3.1 mmol/L — ABNORMAL LOW (ref 3.5–5.1)
Sodium: 132 mmol/L — ABNORMAL LOW (ref 135–145)
Total Bilirubin: 1 mg/dL (ref <1.2)
Total Protein: 9.9 g/dL — ABNORMAL HIGH (ref 6.5–8.1)

## 2023-11-16 LAB — POCT URINALYSIS DIP (MANUAL ENTRY)
Glucose, UA: NEGATIVE mg/dL
Leukocytes, UA: NEGATIVE
Nitrite, UA: POSITIVE — AB
Protein Ur, POC: >=300 mg/dL — AB
Spec Grav, UA: 1.015 (ref 1.010–1.025)
Urobilinogen, UA: 2 U/dL — AB
pH, UA: 8 (ref 5.0–8.0)

## 2023-11-16 LAB — LIPASE, BLOOD: Lipase: 24 U/L (ref 11–51)

## 2023-11-16 LAB — POCT RAPID STREP A (OFFICE): Rapid Strep A Screen: NEGATIVE

## 2023-11-16 LAB — POCT URINE PREGNANCY: Preg Test, Ur: NEGATIVE

## 2023-11-16 MED ORDER — SODIUM CHLORIDE 0.9 % IV BOLUS
500.0000 mL | Freq: Once | INTRAVENOUS | Status: AC
  Administered 2023-11-16: 500 mL via INTRAVENOUS

## 2023-11-16 MED ORDER — ONDANSETRON HCL 4 MG/2ML IJ SOLN
4.0000 mg | Freq: Once | INTRAMUSCULAR | Status: AC
  Administered 2023-11-16: 4 mg via INTRAMUSCULAR

## 2023-11-16 MED ORDER — KETOROLAC TROMETHAMINE 30 MG/ML IJ SOLN: 60.0000 mg | Freq: Once | INTRAMUSCULAR | Status: DC

## 2023-11-16 MED ORDER — LIDOCAINE VISCOUS HCL 2 % MT SOLN
15.0000 mL | Freq: Once | OROMUCOSAL | Status: AC
  Administered 2023-11-16: 15 mL via OROMUCOSAL
  Filled 2023-11-16: qty 15

## 2023-11-16 MED ORDER — ONDANSETRON HCL 4 MG/2ML IJ SOLN
INTRAMUSCULAR | Status: AC
  Administered 2023-11-16: 4 mg via INTRAVENOUS
  Filled 2023-11-16: qty 2

## 2023-11-16 MED ORDER — IOHEXOL 300 MG/ML  SOLN
100.0000 mL | Freq: Once | INTRAMUSCULAR | Status: AC | PRN
  Administered 2023-11-16: 100 mL via INTRAVENOUS

## 2023-11-16 MED ORDER — ONDANSETRON HCL 4 MG/2ML IJ SOLN
4.0000 mg | Freq: Once | INTRAMUSCULAR | Status: AC
  Administered 2023-11-16: 4 mg via INTRAVENOUS
  Filled 2023-11-16: qty 2

## 2023-11-16 MED ORDER — SODIUM CHLORIDE 0.9 % IV SOLN
1.0000 g | Freq: Once | INTRAVENOUS | Status: AC
  Administered 2023-11-16: 1 g via INTRAVENOUS
  Filled 2023-11-16: qty 10

## 2023-11-16 MED ORDER — ONDANSETRON HCL 4 MG/2ML IJ SOLN: 4.0000 mg | Freq: Once | INTRAMUSCULAR | Status: AC

## 2023-11-16 MED ORDER — KETOROLAC TROMETHAMINE 30 MG/ML IJ SOLN
60.0000 mg | Freq: Once | INTRAMUSCULAR | Status: DC
  Administered 2023-11-16: 60 mg via INTRAMUSCULAR

## 2023-11-16 NOTE — ED Notes (Signed)
Patient is being discharged from the Urgent Care and sent to the Emergency Department via POV . Per Dr. Tracie Harrier, patient is in need of higher level of care due to n/v and cystitis. Patient is aware and verbalizes understanding of plan of care.  Vitals:   11/16/23 1317  BP: (!) 150/80  Pulse: 77  Resp: 20  Temp: 98.4 F (36.9 C)  SpO2: 98%

## 2023-11-16 NOTE — ED Provider Notes (Signed)
[place] EMERGENCY DEPARTMENT AT [facility] Provider Note   CSN: [id number] Arrival date & time: 11/16/23  1555     History  Chief Complaint  Patient presents with   Abdominal Pain   Emesis   Cough    [name] is a 37 y.o. female past medical significant for marijuana use, intractable nausea and vomiting presents today for abdominal pain, cough, and nausea and vomiting x 2 days.  Patient was seen at urgent care and diagnosed with UTI.  Patient was given Zofran and Toradol at the urgent care.  Patient had a negative strep test at urgent care.  Patient endorses nausea, vomiting, chills, cough, sore throat, weakness and abdominal pain.  Patient denies diarrhea, headache, fever, or myalgias.  Patient states that her daughter was diagnosed with strep earlier this week.  Patient denies any urinary symptoms.   Abdominal Pain Associated symptoms: cough, nausea, sore throat and vomiting   Emesis Associated symptoms: abdominal pain, cough and sore throat   Cough Associated symptoms: sore throat        Home Medications Prior to Admission medications   Medication Sig Start Date End Date Taking? Authorizing Provider  acetaminophen (TYLENOL) 500 MG tablet Take 1,000 mg by mouth every 6 (six) hours as needed (for headaches).    [provider]  benzonatate (TESSALON) 200 MG capsule Take 1 capsule (200 mg total) by mouth 3 (three) times daily as needed for cough. 09/11/23   Rai, Delene Ruffini, MD  ELDERBERRY IMMUNE HEALTH GUMMY 50-45-3.8 MG CHEW Chew 1-2 tablets by mouth daily.    [provider]  guaiFENesin-dextromethorphan (ROBITUSSIN DM) 100-10 MG/5ML syrup Take 5 mLs by mouth every 4 (four) hours as needed for cough. 09/11/23   Rai, Delene Ruffini, MD  Multiple Vitamins-Calcium (ONE-A-DAY WOMENS FORMULA) TABS Take 1 tablet by mouth daily with breakfast.    [provider]  pantoprazole (PROTONIX) 40 MG tablet Take 1 tablet (40 mg total) by mouth 2  (two) times daily before a meal. 09/11/23 11/10/23  Rai, Ripudeep K, MD  prochlorperazine (COMPAZINE) 10 MG tablet Take 1 tablet (10 mg total) by mouth every 6 (six) hours as needed for nausea or vomiting. 09/11/23   Rai, Ripudeep K, MD  fluticasone (FLONASE) 50 MCG/ACT nasal spray Place 1 spray into both nostrils daily. 12/13/18 07/06/20  Aviva Kluver B, PA-C  omeprazole (PRILOSEC) 20 MG capsule Take 1 capsule (20 mg total) by mouth daily. Patient not taking: Reported on 12/13/2018 01/28/17 07/06/20  Glynn Octave, MD      Allergies    Patient has no known allergies.    Review of Systems   Review of Systems  HENT:  Positive for sore throat.   Respiratory:  Positive for cough.   Gastrointestinal:  Positive for abdominal pain, nausea and vomiting.    Physical Exam Updated Vital Signs BP (!) 141/85   Pulse (!) 54   Temp 98.3 F (36.8 C) (Oral)   Resp 20   Ht 5\' 2"  (1.575 m)   Wt 63.5 kg   LMP 11/14/2023 (Exact Date)   SpO2 97%   BMI 25.61 kg/m  Physical Exam Vitals and nursing note reviewed.  Constitutional:      General: She is not in acute distress.    Appearance: She is well-developed.  HENT:     Head: Normocephalic and atraumatic.     Mouth/Throat:     Mouth: Mucous membranes are moist.     Pharynx: Oropharynx is clear.  Eyes:     Extraocular Movements: Extraocular movements intact.     Conjunctiva/sclera: Conjunctivae normal.  Cardiovascular:     Rate and Rhythm: Normal rate and regular rhythm.     Heart sounds: No murmur heard. Pulmonary:     Effort: Pulmonary effort is normal. No respiratory distress.     Breath sounds: Normal breath sounds.  Abdominal:     General: Abdomen is flat. Bowel sounds are normal.     Palpations: Abdomen is soft.     Tenderness: There is generalized abdominal tenderness. There is no left CVA tenderness or guarding. Negative signs include Murphy's sign and McBurney's sign.  Musculoskeletal:        General: No swelling.     Cervical  back: Neck supple.  Skin:    General: Skin is warm and dry.     Capillary Refill: Capillary refill takes less than 2 seconds.  Neurological:     General: No focal deficit present.     Mental Status: She is alert.     Motor: No weakness.  Psychiatric:        Mood and Affect: Mood normal.     ED Results / Procedures / Treatments   Labs (all labs ordered are listed, but only abnormal results are displayed) Labs Reviewed  COMPREHENSIVE METABOLIC PANEL - Abnormal; Notable for the following components:      Result Value   Sodium 132 (*)    Potassium 3.1 (*)    Chloride 94 (*)    Glucose, Bld 138 (*)    Total Protein 9.9 (*)    Albumin 5.2 (*)    All other components within normal limits  CBC - Abnormal; Notable for the following components:   Hemoglobin 11.6 (*)    HCT 34.7 (*)    RDW 21.4 (*)    All other components within normal limits  LIPASE, BLOOD  HCG, SERUM, QUALITATIVE  URINALYSIS, ROUTINE W REFLEX MICROSCOPIC    EKG None  Radiology No results found.  Procedures Procedures    Medications Ordered in ED Medications  lidocaine (XYLOCAINE) 2 % viscous mouth solution 15 mL (has no administration in time range)  sodium chloride 0.9 % bolus 500 mL (500 mLs Intravenous New Bag/Given 11/16/23 2135)  ondansetron (ZOFRAN) injection 4 mg (4 mg Intravenous Given 11/16/23 2135)  iohexol (OMNIPAQUE) 300 MG/ML solution 100 mL (100 mLs Intravenous Contrast Given 11/16/23 2149)    ED Course/ Medical Decision Making/ A&P Clinical Course as of 11/16/23 2205  Wed Nov 16, 2023  2041 BMI (Calculated): 25.6 [LS]    Clinical Course User Index [LS] Elson Areas, PA-C                                 Medical Decision Making Amount and/or Complexity of Data Reviewed Labs: ordered.  Risk Prescription drug management.   This patient presents to the ED with chief complaint(s) of abdominal pain, cough, nausea, vomiting with pertinent past medical history of intractable  nausea and vomiting which further complicates the presenting complaint. The complaint involves an extensive differential diagnosis and also carries with it a high risk of complications and morbidity.    The differential diagnosis includes appendicitis, viral illness, gastroenteritis,  Additional history obtained: Records reviewed previous admission documents  ED Course and Reassessment:   Independent labs interpretation:  The following labs were independently interpreted:  CBC: Mild anemia CMP: Mild hyponatremia, hypokalemia, hypochloremia, mildly elevated  alk phos and total protein hCG serum: Negative Lipase: 24 UA: Pending EKG: Pending  Independent visualization of imaging: - I independently visualized the following imaging with scope of interpretation limited to determining acute life threatening conditions related to emergency care: CT abdomen pelvis with contrast, which revealed pending  Consultation: - Consulted or discussed management/test interpretation w/ external professional: None  Consideration for admission or further workup: Patient signed out to Barrie Dunker, PA-C at shift change pending CT abdomen pelvis, UA, and EKG.        Final Clinical Impression(s) / ED Diagnoses Final diagnoses:  None    Rx / DC Orders ED Discharge Orders     None         Gretta Began 11/16/23 2205    Jacalyn Lefevre, MD 11/16/23 2329

## 2023-11-16 NOTE — ED Triage Notes (Addendum)
Pt c/o abdominal pain, cough, and n/v x2 days.  Pain score 8/10.  Pt was seen at [facility], diagnosed w/ a UTI, and directed to ED.    Pt was given Zofran and Toradol at UC.  Sts she feels a bit better.  Strep test was negative.       Pt reports starting her menstrual cycle x2 days ago.

## 2023-11-16 NOTE — Discharge Instructions (Signed)
Meds ordered this encounter  Medications   ondansetron (ZOFRAN) injection 4 mg   ketorolac (TORADOL) 30 MG/ML injection 60 mg

## 2023-11-16 NOTE — ED Triage Notes (Signed)
Pt here for vomiting and abd pain x 3 days; pt sts unable to keep down PO

## 2023-11-16 NOTE — ED Provider Notes (Signed)
[facility]   [id number] 11/16/23 Arrival Time: 1235  ASSESSMENT & PLAN:  1. Nausea and vomiting, unspecified vomiting type   2. Sore throat   3. Cystitis    Feels she cannot tolerate PO medications at all.  Limited treatment/evaluation options here.  Meds ordered this encounter  Medications   ondansetron (ZOFRAN) injection 4 mg   DISCONTD: ketorolac (TORADOL) 30 MG/ML injection 60 mg   ketorolac (TORADOL) 30 MG/ML injection 60 mg   Discharged to ED for further evaluation and treatment. Stable upon discharge; by POV.  Results for orders placed or performed during the hospital encounter of 11/16/23  POCT urinalysis dipstick  Result Value Ref Range   Color, UA yellow yellow   Clarity, UA clear clear   Glucose, UA negative negative mg/dL   Bilirubin, UA small (A) negative   Ketones, POC UA moderate (40) (A) negative mg/dL   Spec Grav, UA 4.098 1.191 - 1.025   Blood, UA trace-intact (A) negative   pH, UA 8.0 5.0 - 8.0   Protein Ur, POC >=300 (A) negative mg/dL   Urobilinogen, UA 2.0 (A) 0.2 or 1.0 E.U./dL   Nitrite, UA Positive (A) Negative   Leukocytes, UA Negative Negative  POCT urine pregnancy  Result Value Ref Range   Preg Test, Ur Negative Negative  POCT rapid strep A  Result Value Ref Range   Rapid Strep A Screen Negative Negative   Throat culture sent.  Reviewed expectations re: course of current medical issues. Questions answered. Outlined signs and symptoms indicating need for more acute intervention. Patient verbalized understanding. After Visit Summary given.   SUBJECTIVE: History from: patient.  [name] is a 37 y.o. female with h/o polysubstance abuse including alcohol abuse who presents with complaint of non-bilious, non-bloody n/v with associated mid to lower abd pain. Denies diarrhea. Denies fever. Ques urinary frequency.  Feels fatigued. H/O similar requiring hospital admission 9/24.  Past Surgical History:  Procedure  Laterality Date   BIOPSY  09/10/2023   Procedure: BIOPSY;  Surgeon: Willis Modena, MD;  Location: [facility];  Service: Gastroenterology;;   BREAST LUMPECTOMY Right    benign pathology   CESAREAN SECTION  2005   ESOPHAGOGASTRODUODENOSCOPY (EGD) WITH PROPOFOL N/A 09/10/2023   Procedure: ESOPHAGOGASTRODUODENOSCOPY (EGD) WITH PROPOFOL;  Surgeon: Willis Modena, MD;  Location: [facility];  Service: Gastroenterology;  Laterality: N/A;   MOUTH SURGERY      OBJECTIVE:  Vitals:   11/16/23 1317  BP: (!) 150/80  Pulse: 77  Resp: 20  Temp: 98.4 F (36.9 C)  TempSrc: Oral  SpO2: 98%    General appearance: alert; no distress Oropharynx: dry Lungs: clear to auscultation bilaterally; unlabored Heart: regular  Abdomen: soft; non-distended; no significant abdominal tenderness; reports "cramping" feeling; bowel sounds present; no masses or organomegaly; no guarding or rebound tenderness Back: no CVA tenderness Extremities: no edema; symmetrical with no gross deformities Skin: warm; dry Neurologic: normal gait Psychological: alert and cooperative; normal mood and affect  Labs: Results for orders placed or performed during the hospital encounter of 11/16/23  POCT urinalysis dipstick  Result Value Ref Range   Color, UA yellow yellow   Clarity, UA clear clear   Glucose, UA negative negative mg/dL   Bilirubin, UA small (A) negative   Ketones, POC UA moderate (40) (A) negative mg/dL   Spec Grav, UA 4.782 9.562 - 1.025   Blood, UA trace-intact (A) negative   pH, UA 8.0 5.0 - 8.0   Protein Ur, POC >=300 (  A) negative mg/dL   Urobilinogen, UA 2.0 (A) 0.2 or 1.0 E.U./dL   Nitrite, UA Positive (A) Negative   Leukocytes, UA Negative Negative  POCT urine pregnancy  Result Value Ref Range   Preg Test, Ur Negative Negative  POCT rapid strep A  Result Value Ref Range   Rapid Strep A Screen Negative Negative   Labs Reviewed  POCT URINALYSIS DIP (MANUAL ENTRY) - Abnormal; Notable for the  following components:      Result Value   Bilirubin, UA small (*)    Ketones, POC UA moderate (40) (*)    Blood, UA trace-intact (*)    Protein Ur, POC >=300 (*)    Urobilinogen, UA 2.0 (*)    Nitrite, UA Positive (*)    All other components within normal limits  CULTURE, GROUP A STREP [facility])  POCT URINE PREGNANCY  POCT RAPID STREP A (OFFICE)    Imaging: No results found.  No Known Allergies                                             Past Medical History:  Diagnosis Date   Abdominal pain, epigastric 04/17/2013   Adjustment disorder with emotional disturbance    Alcoholic gastritis 03/2013    hospitalization   Dehydration 04/04/2013   Hematemesis 03/2013   along with alcoholic gastritis, hospitalization   Hematemesis- mild 04/04/2013   Intractable nausea, vomiting & abdominal pain. 04/04/2013   Metabolic acidosis 03/2013    hospitaliation due to alcohol abuse, dehyration, nausea/vomiting   Metabolic acidosis, increased anion gap 04/04/2013   Polysubstance abuse (HCC)    Pyelonephritis 03/2010   hospitalization   Seizures (HCC)    Stomach ulcer    never had any tests to confirm this diagni=oses   Social History   Socioeconomic History   Marital status: Single    Spouse name: Not on file   Number of children: 1   Years of education: College   Highest education level: Not on file  Occupational History    Employer: OTHER    Comment: [employer]  Tobacco Use   Smoking status: Every Day    Current packs/day: 0.00    Types: Cigarettes    Last attempt to quit: 11/26/2013    Years since quitting: 9.9   Smokeless tobacco: Never  Substance and Sexual Activity   Alcohol use: Yes    Comment: binge drinking; quit Aug. 2014 very little drinking now   Drug use: Yes    Types: Marijuana, Cocaine   Sexual activity: Yes    Birth control/protection: None  Other Topics Concern   Not on file  Social History Narrative   Homeless, lives with friends, family, in hotels,  random locations.  Mother is supportive.  No relationship with father.   Questionable relationship with siblings?  No significant other.  Unemployed as of 03/2013.   Patient lives with family.   Caffeine Use: none   Social Determinants of Health   Financial Resource Strain: Not on file  Food Insecurity: Patient Declined (09/08/2023)   Hunger Vital Sign    Worried About Running Out of Food in the Last Year: Patient declined    Ran Out of Food in the Last Year: Patient declined  Transportation Needs: Patient Declined (09/08/2023)   PRAPARE - Administrator, Civil Service (Medical): Patient declined  Lack of Transportation (Non-Medical): Patient declined  Physical Activity: Not on file  Stress: Not on file  Social Connections: Not on file  Intimate Partner Violence: Patient Declined (09/08/2023)   Humiliation, Afraid, Rape, and Kick questionnaire    Fear of Current or Ex-Partner: Patient declined    Emotionally Abused: Patient declined    Physically Abused: Patient declined    Sexually Abused: Patient declined   Family History  Problem Relation Age of Onset   Diabetes Mother    Hypertension Father    Diabetes Maternal Aunt    Diabetes Maternal Uncle    Cancer Maternal Uncle    Diabetes Maternal Grandmother    Cancer Maternal Grandmother    Diabetes Maternal Glynda Jaeger, MD 11/16/23 [phone]

## 2023-11-17 MED ORDER — CEPHALEXIN 500 MG PO CAPS: 500.0000 mg | ORAL_CAPSULE | Freq: Four times a day (QID) | ORAL | 0 refills | Status: DC

## 2023-11-17 NOTE — Discharge Instructions (Addendum)
Please take the prescribed antibiotics along with your earlier prescribed Zofran.  If you develop any life-threatening symptoms return to the emergency department.

## 2023-11-17 NOTE — ED Provider Notes (Signed)
  Physical Exam  BP (!) 142/78   Pulse 63   Temp 98.5 F (36.9 C) (Oral)   Resp 18   Ht 5\' 2"  (1.575 m)   Wt 63.5 kg   LMP 11/14/2023 (Exact Date)   SpO2 100%   BMI 25.61 kg/m   Physical Exam  Procedures  Procedures  ED Course / MDM   Clinical Course as of 11/17/23 0212  Wed Nov 16, 2023  2041 BMI (Calculated): 25.6 [LS]    Clinical Course User Index [LS] Elson Areas, PA-C   Medical Decision Making Amount and/or Complexity of Data Reviewed Labs: ordered. Radiology: ordered.  Risk Prescription drug management.   Patient care assumed from Langston Masker, PA-C at shift handoff.  See her note for full details.  In short, 37 year old female patient with medical history significant for marijuana use, intractable nausea and vomiting, presents emergency department complaining of abdominal pain, cough, and nausea and vomiting for the past 2 days.  She was seen at an urgent care and diagnosed with a urinary tract infection.  She was given Zofran and Toradol and had a negative strep test.  She continues to endorse nausea, vomiting, chills, cough, sore throat, abdominal pain.  She denies urinary symptoms.  She is here at the emergency department for continued pain control and for further evaluation as she was unable to take her oral antibiotics.  CT abdomen pelvis was pending at time of shift handoff along with reassessment.  CT abdomen pelvis with contrast with no acute findings.  I reassessed patient at bedside and she is feeling much better at this time and able to tolerate p.o. intake.  Patient feels comfortable with discharge home.  She will continue to take the earlier prescribed Zofran. I will prescribe Keflex. Return precautions provided       [name] 11/17/23 1914    Dione Booze, MD 11/17/23 [phone]

## 2023-11-18 LAB — CULTURE, GROUP A STREP (THRC)

## 2023-12-12 ENCOUNTER — Ambulatory Visit (INDEPENDENT_AMBULATORY_CARE_PROVIDER_SITE_OTHER)
Admission: EM | Admit: 2023-12-12 | Discharge: 2023-12-13 | Disposition: A | Discharge status: Other Acute Inpatient Hospital | Payer: No Typology Code available for payment source | Attending: Psychiatry | Admitting: Psychiatry

## 2023-12-12 ENCOUNTER — Ambulatory Visit: Payer: Self-pay

## 2023-12-12 DIAGNOSIS — F1020 Alcohol dependence, uncomplicated: Secondary | ICD-10-CM | POA: Diagnosis not present

## 2023-12-12 DIAGNOSIS — F17200 Nicotine dependence, unspecified, uncomplicated: Secondary | ICD-10-CM

## 2023-12-12 DIAGNOSIS — F32A Depression, unspecified: Secondary | ICD-10-CM | POA: Insufficient documentation

## 2023-12-12 DIAGNOSIS — F10230 Alcohol dependence with withdrawal, uncomplicated: Secondary | ICD-10-CM | POA: Diagnosis not present

## 2023-12-12 DIAGNOSIS — Z560 Unemployment, unspecified: Secondary | ICD-10-CM | POA: Insufficient documentation

## 2023-12-12 DIAGNOSIS — F1290 Cannabis use, unspecified, uncomplicated: Secondary | ICD-10-CM

## 2023-12-12 DIAGNOSIS — Z5900 Homelessness unspecified: Secondary | ICD-10-CM | POA: Insufficient documentation

## 2023-12-12 DIAGNOSIS — F17210 Nicotine dependence, cigarettes, uncomplicated: Secondary | ICD-10-CM | POA: Insufficient documentation

## 2023-12-12 DIAGNOSIS — F10939 Alcohol use, unspecified with withdrawal, unspecified: Secondary | ICD-10-CM | POA: Diagnosis not present

## 2023-12-12 LAB — CBC WITH DIFFERENTIAL/PLATELET
Abs Immature Granulocytes: 0.01 10*3/uL (ref 0.00–0.07)
Basophils Absolute: 0 10*3/uL (ref 0.0–0.1)
Basophils Relative: 1 %
Eosinophils Absolute: 0.1 10*3/uL (ref 0.0–0.5)
Eosinophils Relative: 2 %
HCT: 35.5 % — ABNORMAL LOW (ref 36.0–46.0)
Hemoglobin: 11.3 g/dL — ABNORMAL LOW (ref 12.0–15.0)
Immature Granulocytes: 0 %
Lymphocytes Relative: 38 %
Lymphs Abs: 1.4 10*3/uL (ref 0.7–4.0)
MCH: 25.2 pg — ABNORMAL LOW (ref 26.0–34.0)
MCHC: 31.8 g/dL (ref 30.0–36.0)
MCV: 79.1 fL — ABNORMAL LOW (ref 80.0–100.0)
Monocytes Absolute: 0.3 10*3/uL (ref 0.1–1.0)
Monocytes Relative: 8 %
Neutro Abs: 2 10*3/uL (ref 1.7–7.7)
Neutrophils Relative %: 51 %
Platelets: 319 10*3/uL (ref 150–400)
RBC: 4.49 MIL/uL (ref 3.87–5.11)
RDW: 20.2 % — ABNORMAL HIGH (ref 11.5–15.5)
WBC: 3.8 10*3/uL — ABNORMAL LOW (ref 4.0–10.5)
nRBC: 0 % (ref 0.0–0.2)

## 2023-12-12 LAB — COMPREHENSIVE METABOLIC PANEL
ALT: 36 U/L (ref 0–44)
AST: 61 U/L — ABNORMAL HIGH (ref 15–41)
Albumin: 4.7 g/dL (ref 3.5–5.0)
Alkaline Phosphatase: 106 U/L (ref 38–126)
Anion gap: 17 — ABNORMAL HIGH (ref 5–15)
BUN: 5 mg/dL — ABNORMAL LOW (ref 6–20)
CO2: 24 mmol/L (ref 22–32)
Calcium: 9.4 mg/dL (ref 8.9–10.3)
Chloride: 102 mmol/L (ref 98–111)
Creatinine, Ser: 0.7 mg/dL (ref 0.44–1.00)
GFR, Estimated: >60 mL/min (ref >60)
Glucose, Bld: 121 mg/dL — ABNORMAL HIGH (ref 70–99)
Potassium: 3.2 mmol/L — ABNORMAL LOW (ref 3.5–5.1)
Sodium: 143 mmol/L (ref 135–145)
Total Bilirubin: 0.5 mg/dL (ref <1.2)
Total Protein: 9 g/dL — ABNORMAL HIGH (ref 6.5–8.1)

## 2023-12-12 LAB — TSH: TSH: 0.656 u[IU]/mL (ref 0.350–4.500)

## 2023-12-12 LAB — ETHANOL: Alcohol, Ethyl (B): 267 mg/dL — ABNORMAL HIGH (ref <10)

## 2023-12-12 LAB — MAGNESIUM: Magnesium: 2 mg/dL (ref 1.7–2.4)

## 2023-12-12 MED ORDER — ACETAMINOPHEN 325 MG PO TABS
650.0000 mg | ORAL_TABLET | Freq: Four times a day (QID) | ORAL | Status: DC | PRN
  Administered 2023-12-12 – 2023-12-13 (×2): 650 mg via ORAL
  Filled 2023-12-12 (×2): qty 2

## 2023-12-12 MED ORDER — TRAZODONE HCL 50 MG PO TABS
50.0000 mg | ORAL_TABLET | Freq: Every evening | ORAL | Status: DC | PRN
  Administered 2023-12-12: 50 mg via ORAL
  Filled 2023-12-12: qty 1

## 2023-12-12 MED ORDER — HYDROXYZINE HCL 25 MG PO TABS
25.0000 mg | ORAL_TABLET | Freq: Four times a day (QID) | ORAL | Status: DC | PRN
Start: 2023-12-12 — End: 2023-12-15
  Administered 2023-12-13: 25 mg via ORAL

## 2023-12-12 MED ORDER — ADULT MULTIVITAMIN W/MINERALS CH
1.0000 | ORAL_TABLET | Freq: Every day | ORAL | Status: DC
  Administered 2023-12-12: 1 via ORAL
  Filled 2023-12-12: qty 1

## 2023-12-12 MED ORDER — LORAZEPAM 1 MG PO TABS: 1.0000 mg | ORAL_TABLET | Freq: Two times a day (BID) | ORAL | Status: DC

## 2023-12-12 MED ORDER — THIAMINE MONONITRATE 100 MG PO TABS: 100.0000 mg | ORAL_TABLET | Freq: Every day | ORAL | Status: DC

## 2023-12-12 MED ORDER — NICOTINE 21 MG/24HR TD PT24
21.0000 mg | MEDICATED_PATCH | Freq: Every day | TRANSDERMAL | Status: DC
  Administered 2023-12-13: 21 mg via TRANSDERMAL
  Filled 2023-12-12: qty 1

## 2023-12-12 MED ORDER — LORAZEPAM 1 MG PO TABS
1.0000 mg | ORAL_TABLET | Freq: Four times a day (QID) | ORAL | Status: DC
  Administered 2023-12-12: 1 mg via ORAL
  Filled 2023-12-12: qty 1

## 2023-12-12 MED ORDER — LORAZEPAM 1 MG PO TABS: 1.0000 mg | ORAL_TABLET | Freq: Every day | ORAL | Status: DC

## 2023-12-12 MED ORDER — LOPERAMIDE HCL 2 MG PO CAPS: 2.0000 mg | ORAL_CAPSULE | ORAL | Status: DC | PRN

## 2023-12-12 MED ORDER — ALUM & MAG HYDROXIDE-SIMETH 200-200-20 MG/5ML PO SUSP: 30.0000 mL | ORAL | Status: DC | PRN

## 2023-12-12 MED ORDER — THIAMINE HCL 100 MG/ML IJ SOLN: 100.0000 mg | Freq: Once | INTRAMUSCULAR | Status: DC

## 2023-12-12 MED ORDER — PROCHLORPERAZINE MALEATE 5 MG PO TABS: 5.0000 mg | ORAL_TABLET | Freq: Three times a day (TID) | ORAL | Status: DC | PRN

## 2023-12-12 MED ORDER — LORAZEPAM 1 MG PO TABS
1.0000 mg | ORAL_TABLET | Freq: Three times a day (TID) | ORAL | Status: DC
Start: 2023-12-14 — End: 2023-12-15

## 2023-12-12 MED ORDER — LORAZEPAM 1 MG PO TABS
1.0000 mg | ORAL_TABLET | Freq: Four times a day (QID) | ORAL | Status: DC | PRN
Start: 2023-12-12 — End: 2023-12-15
  Administered 2023-12-12 – 2023-12-13 (×3): 1 mg via ORAL
  Filled 2023-12-12 (×3): qty 1

## 2023-12-12 MED ORDER — HYDROXYZINE HCL 25 MG PO TABS: 25.0000 mg | ORAL_TABLET | Freq: Three times a day (TID) | ORAL | Status: DC | PRN

## 2023-12-12 MED ORDER — MAGNESIUM HYDROXIDE 400 MG/5ML PO SUSP
30.0000 mL | Freq: Every day | ORAL | Status: DC | PRN
Start: 2023-12-12 — End: 2023-12-13

## 2023-12-12 MED ORDER — GABAPENTIN 100 MG PO CAPS
100.0000 mg | ORAL_CAPSULE | Freq: Three times a day (TID) | ORAL | Status: DC
  Administered 2023-12-12 (×2): 100 mg via ORAL
  Filled 2023-12-12 (×2): qty 1

## 2023-12-12 NOTE — ED Notes (Signed)
Patient observed/assessed in bed/chair resting quietly appearing in no distress and verbalizing no complaints at this time. Will continue to monitor.  

## 2023-12-12 NOTE — Progress Notes (Signed)
   12/12/23 1444  [facility] Triage Screening (Walk-ins at [facility] only)  How Did You Hear About [place]? Family/Friend  What Is the Reason for Your Visit/Call Today? Getchell is a 37 year old female presenting to [facility] accompanied by her partner. Pt reports that she wants to stop drinking. Pt reports that she has been drinking for 20 years (off and on). Pt reports that she had a bottle of liquor last night and wine cooler (12oz) this morning. Pt reports that she drinks every other day. Pt reports that she also smokes marijuana daily. Pt is not currently taking medication or seeing a therapist at this time. Pt is looking to be admitted for her ongoing alcohol problem. Pt reports she has never been admitted to a psych hospital before. Pt denies Si, Hi and Avh currently.  How Long Has This Been Causing You Problems? > than 6 months  Have You Recently Had Any Thoughts About Hurting Yourself? No  Are You Planning to Commit Suicide/Harm Yourself At This time? No  Have you Recently Had Thoughts About Hurting Someone Karolee Ohs? No  Are You Planning To Harm Someone At This Time? No  Physical Abuse Denies  Verbal Abuse Denies  Sexual Abuse Denies  Exploitation of patient/patient's resources Denies  Self-Neglect Denies  Possible abuse reported to: Other (Comment)  Are you currently experiencing any auditory, visual or other hallucinations? No  Have You Used Any Alcohol or Drugs in the Past 24 Hours? Yes  How long ago did you use Drugs or Alcohol? today. wine cooler  What Did You Use and How Much? 12 oz this morning and last night she had a bottle of liquor  Do you have any current medical co-morbidities that require immediate attention? No  Clinician description of patient physical appearance/behavior: taerful,  What Do You Feel Would Help You the Most Today? Alcohol or Drug Use Treatment  If access to [facility] was not available, would you have sought care in the Emergency Department? No  Determination of Need Urgent  (48 hours)  Options For Referral Intensive Outpatient Therapy  Determination of Need filed? Yes

## 2023-12-12 NOTE — ED Notes (Signed)
Patient A&Ox4. Patient present Substance abuse. Patient denies SI/HI and AVH. Patient denies any physical complaints when asked. No acute distress noted. Support and encouragement provided. Routine safety checks conducted according to facility protocol. Encouraged patient to notify staff if thoughts of harm toward self or others arise. Patient verbalize understanding and agreement. Will continue to monitor for safety.
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
## 2023-12-12 NOTE — ED Notes (Signed)
Pt admitted to overnight observation unit requesting detox from ETOH. Calm, cooperative throughout interview process. Pt denies SI/HI/AVH. Pt states, "I've been drinking for about 96yrs now but I'm tired". Pt reports last drink this am, unsure of the amount. Pt smell of ETOH. Pt tearful throughout assessment. Skin assessment completed. Oriented to unit. Meal and drink offered. Writer observed tremors while drawing blood. Pt received Ativan 1mg  po for sx mgmt. Pt verbally contract for safety. Will monitor for safety.

## 2023-12-12 NOTE — ED Provider Notes (Signed)
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 paraffin embedded tissue using a 3,3"diaminobenzidine (DAB) chromogen and Leica Bon                         [employer]. The staining intensity of the nucleus is score manually and is reported as  the percentage of tumor cell nuclei demonstrating specific nuclear staining. The specimens are fixed in 10% Neutral Formalin for at least 6 hours and up to 72hrs. These tests are validated on decalcified tissue. Results should be interpreted with caution given the possibility of false negative results on decalcified specimens. Antibody Clones are as follows ER-clone [id number], PR-clone 16, Ki67- clone MM1. Some of these immunohistochemical stains may have been developed and the performance characteristics determined by [facility].    Glucose-Capillary 09/10/2023 109 (H)  70 - 99 mg/dL Final   Glucose reference range applies only to samples taken after fasting for at least 8 hours.   Glucose-Capillary 09/10/2023 85  70 - 99 mg/dL Final   Glucose reference range applies only to samples taken after fasting for at least 8 hours.   Comment 1 09/10/2023 Notify RN   Final   Sodium 09/11/2023 135  135 - 145 mmol/L Final   Potassium 09/11/2023 3.7  3.5 - 5.1 mmol/L Final   Chloride 09/11/2023 103  98 - 111 mmol/L Final   CO2 09/11/2023 23  22 - 32 mmol/L Final   Glucose, Bld 09/11/2023 87  70 - 99 mg/dL Final   Glucose reference range applies only to samples taken after fasting for at least 8 hours.   BUN 09/11/2023 <5 (L)  6 - 20 mg/dL Final   Creatinine, Ser 09/11/2023 0.51  0.44 - 1.00 mg/dL Final   Calcium 29/56/2130 8.7 (L)  8.9 - 10.3 mg/dL Final   GFR, Estimated 09/11/2023 >60  >60 mL/min Final   Comment: (NOTE) Calculated using the CKD-EPI Creatinine Equation (2021)    Anion gap 09/11/2023 9  5 - 15 Final   Performed at [facility], [address] [place], [place] [address]   WBC 09/11/2023 6.4  4.0 - 10.5 K/uL Final   RBC 09/11/2023 3.83 (L)  3.87 - 5.11 MIL/uL Final   Hemoglobin 09/11/2023 10.3 (L)  12.0 - 15.0 g/dL Final   HCT 46/96/2952 33.8 (L)  36.0 - 46.0 % Final   MCV 09/11/2023 88.3  80.0 - 100.0 fL Final   MCH 09/11/2023 26.9  26.0 - 34.0 pg Final    MCHC 09/11/2023 30.5  30.0 - 36.0 g/dL Final   RDW 84/13/2440 21.3 (H)  11.5 - 15.5 % Final   Platelets 09/11/2023 207  150 - 400 K/uL Final   nRBC 09/11/2023 0.0  0.0 - 0.2 % Final   Performed at [facility], [address], [place], [place] [address]   Magnesium 09/11/2023 1.6 (L)  1.7 - 2.4 mg/dL Final   Performed at [facility], [address] [place], [place] [address]   Glucose-Capillary 09/10/2023 103 (H)  70 - 99 mg/dL Final   Glucose reference range applies only to samples taken after fasting for at least 8 hours.   Glucose-Capillary 09/10/2023 99  70 - 99 mg/dL Final   Glucose reference range applies only to samples taken after fasting for at least 8 hours.   Glucose-Capillary 09/11/2023 90  70 - 99 mg/dL Final   Glucose reference range applies  only to samples taken after fasting for at least 8 hours.   Glucose-Capillary 09/11/2023 120 (H)  70 - 99 mg/dL Final   Glucose reference range applies only to samples taken after fasting for at least 8 hours.   Comment 1 09/11/2023 Notify RN   Final   Glucose-Capillary 09/11/2023 87  70 - 99 mg/dL Final   Glucose reference range applies only to samples taken after fasting for at least 8 hours.   Comment 1 09/11/2023 Notify RN   Final   Glucose-Capillary 09/11/2023 85  70 - 99 mg/dL Final   Glucose reference range applies only to samples taken after fasting for at least 8 hours.   Comment 1 09/11/2023 Notify RN   Final  Admission on 09/04/2023, Discharged on 09/05/2023  Component Date Value Ref Range Status   Sodium 09/04/2023 134 (L)  135 - 145 mmol/L Final   Potassium 09/04/2023 3.2 (L)  3.5 - 5.1 mmol/L Final   Chloride 09/04/2023 98  98 - 111 mmol/L Final   CO2 09/04/2023 18 (L)  22 - 32 mmol/L Final   Glucose, Bld 09/04/2023 85  70 - 99 mg/dL Final   Glucose reference range applies only to samples taken after fasting for at least 8 hours.   BUN 09/04/2023 10  6 - 20 mg/dL Final    Creatinine, Ser 09/04/2023 0.66  0.44 - 1.00 mg/dL Final   Calcium 28/41/3244 9.2  8.9 - 10.3 mg/dL Final   GFR, Estimated 09/04/2023 >60  >60 mL/min Final   Comment: (NOTE) Calculated using the CKD-EPI Creatinine Equation (2021)    Anion gap 09/04/2023 18 (H)  5 - 15 Final   Performed at [facility], [address] [place], [place] [address]   WBC 09/04/2023 4.1  4.0 - 10.5 K/uL Final   RBC 09/04/2023 3.97  3.87 - 5.11 MIL/uL Final   Hemoglobin 09/04/2023 10.3 (L)  12.0 - 15.0 g/dL Final   HCT 25/36/6440 32.5 (L)  36.0 - 46.0 % Final   MCV 09/04/2023 81.9  80.0 - 100.0 fL Final   MCH 09/04/2023 25.9 (L)  26.0 - 34.0 pg Final   MCHC 09/04/2023 31.7  30.0 - 36.0 g/dL Final   RDW 34/74/2595 20.6 (H)  11.5 - 15.5 % Final   Platelets 09/04/2023 298  150 - 400 K/uL Final   nRBC 09/04/2023 0.0  0.0 - 0.2 % Final   Performed at [facility], [address] [place], [place] [address]   Troponin I (High Sensitivity) 09/04/2023 3  <18 ng/L Final   Comment: (NOTE) Elevated high sensitivity troponin I (hsTnI) values and significant  changes across serial measurements may suggest ACS but many other  chronic and acute conditions are known to elevate hsTnI results.  Refer to the "Links" section for chest pain algorithms and additional  guidance. Performed at [facility], [address], [place], [place] [address]    Preg, Serum 09/04/2023 NEGATIVE  NEGATIVE Final   Comment:        THE SENSITIVITY OF THIS METHODOLOGY IS >10 mIU/mL. Performed at [facility], [address] [place], [place] [address]    SARS Coronavirus 2 by RT PCR 09/04/2023 NEGATIVE  NEGATIVE Final   Comment: (NOTE) SARS-CoV-2 target nucleic acids are NOT DETECTED.  The SARS-CoV-2 RNA is generally detectable in upper and lower respiratory specimens during the acute phase of infection. The lowest concentration of SARS-CoV-2 viral copies this  assay can detect  is 250 copies / mL. A negative result does not preclude SARS-CoV-2 infection and should not be used as the sole basis for treatment or other patient management decisions.  A negative result may occur with improper specimen collection / handling, submission of specimen other than nasopharyngeal swab, presence of viral mutation(s) within the areas targeted by this assay, and inadequate number of viral copies (<250 copies / mL). A negative result must be combined with clinical observations, patient history, and epidemiological information.  Fact Sheet for Patients:   RoadLapTop.co.za  Fact Sheet for Healthcare Providers: http://kim-miller.com/  This test is not yet approved or                           cleared by the [place] FDA and has been authorized for detection and/or diagnosis of SARS-CoV-2 by FDA under an Emergency Use Authorization (EUA).  This EUA will remain in effect (meaning this test can be used) for the duration of the COVID-19 declaration under Section 564(b)(1) of the Act, 21 U.S.C. section 360bbb-3(b)(1), unless the authorization is terminated or revoked sooner.  Performed at [facility], [address], [place], [place] [address]   Admission on 07/04/2023, Discharged on 07/04/2023  Component Date Value Ref Range Status   WBC 07/04/2023 4.0  4.0 - 10.5 K/uL Final   RBC 07/04/2023 3.94  3.87 - 5.11 MIL/uL Final   Hemoglobin 07/04/2023 10.4 (L)  12.0 - 15.0 g/dL Final   HCT 60/45/4098 32.0 (L)  36.0 - 46.0 % Final   MCV 07/04/2023 81.2  80.0 - 100.0 fL Final   MCH 07/04/2023 26.4  26.0 - 34.0 pg Final   MCHC 07/04/2023 32.5  30.0 - 36.0 g/dL Final   RDW 11/91/4782 23.7 (H)  11.5 - 15.5 % Final   Platelets 07/04/2023 234  150 - 400 K/uL Final   nRBC 07/04/2023 0.0  0.0 - 0.2 % Final   Neutrophils Relative % 07/04/2023 58  % Final   Neutro Abs 07/04/2023 2.3  1.7 - 7.7 K/uL Final    Lymphocytes Relative 07/04/2023 26  % Final   Lymphs Abs 07/04/2023 1.0  0.7 - 4.0 K/uL Final   Monocytes Relative 07/04/2023 13  % Final   Monocytes Absolute 07/04/2023 0.5  0.1 - 1.0 K/uL Final   Eosinophils Relative 07/04/2023 2  % Final   Eosinophils Absolute 07/04/2023 0.1  0.0 - 0.5 K/uL Final   Basophils Relative 07/04/2023 1  % Final   Basophils Absolute 07/04/2023 0.0  0.0 - 0.1 K/uL Final   Immature Granulocytes 07/04/2023 0  % Final   Abs Immature Granulocytes 07/04/2023 0.00  0.00 - 0.07 K/uL Final   Performed at [facility], [address], [place], [place] [address]   Sodium 07/04/2023 136  135 - 145 mmol/L Final   Potassium 07/04/2023 3.0 (L)  3.5 - 5.1 mmol/L Final   Chloride 07/04/2023 102  98 - 111 mmol/L Final   CO2 07/04/2023 21 (L)  22 - 32 mmol/L Final   Glucose, Bld 07/04/2023 108 (H)  70 - 99 mg/dL Final   Glucose reference range applies only to samples taken after fasting for at least 8 hours.   BUN 07/04/2023 7  6 - 20 mg/dL Final   Creatinine, Ser 07/04/2023 0.71  0.44 - 1.00 mg/dL Final   Calcium 30/86/5784 8.9  8.9 - 10.3 mg/dL Final   Total Protein 69/62/9528 7.4  6.5 - 8.1  g/dL Final   Albumin 40/34/7425 4.1  3.5 - 5.0 g/dL Final   AST 95/63/8756 52 (H)  15 - 41 U/L Final   ALT 07/04/2023 48 (H)  0 - 44 U/L Final   Alkaline Phosphatase 07/04/2023 74  38 - 126 U/L Final   Total Bilirubin 07/04/2023 0.7  0.3 - 1.2 mg/dL Final   GFR, Estimated 07/04/2023 >60  >60 mL/min Final   Comment: (NOTE) Calculated using the CKD-EPI Creatinine Equation (2021)    Anion gap 07/04/2023 13  5 - 15 Final   Performed at [facility], [address], [place], [place] [address]   Lipase 07/04/2023 25  11 - 51 U/L Final   Performed at [facility], [address] [place], [place] [address]   Troponin I (High Sensitivity) 07/04/2023 3  <18 ng/L Final   Comment: (NOTE) Elevated high sensitivity troponin I (hsTnI)  values and significant  changes across serial measurements may suggest ACS but many other  chronic and acute conditions are known to elevate hsTnI results.  Refer to the "Links" section for chest pain algorithms and additional  guidance. Performed at [facility], [address], [place], [place] [address]    hCG, Conley Rolls, Quant, S 07/04/2023 <1  <5 mIU/mL Final   Comment:          GEST. AGE      CONC.  (mIU/mL)   <=1 WEEK        5 - 50     2 WEEKS       50 - 500     3 WEEKS       100 - 10,000     4 WEEKS     1,000 - 30,000     5 WEEKS     3,500 - 115,000   6-8 WEEKS     12,000 - 270,000    12 WEEKS     15,000 - 220,000        FEMALE AND NON-PREGNANT FEMALE:     LESS THAN 5 mIU/mL Performed at [facility], [address], [place], [place] [address]    Troponin I (High Sensitivity) 07/04/2023 2  <18 ng/L Final   Comment: (NOTE) Elevated high sensitivity troponin I (hsTnI) values and significant  changes across serial measurements may suggest ACS but many other  chronic and acute conditions are known to elevate hsTnI results.  Refer to the "Links" section for chest pain algorithms and additional  guidance. Performed at [facility], [address], [place], [place] [address]     Allergies: Patient has no known allergies.  Medications:  Facility Ordered Medications  Medication   acetaminophen (TYLENOL) tablet 650 mg   alum & mag hydroxide-simeth (MAALOX/MYLANTA) 200-200-20 MG/5ML suspension 30 mL   magnesium hydroxide (MILK OF MAGNESIA) suspension 30 mL   traZODone (DESYREL) tablet 50 mg   [START ON 12/13/2023] nicotine (NICODERM CQ - dosed in mg/24 hours) patch 21 mg   thiamine (VITAMIN B1) injection 100 mg   [START ON 12/13/2023] thiamine (VITAMIN B1) tablet 100 mg   multivitamin with minerals tablet 1 tablet   LORazepam (ATIVAN) tablet 1 mg   hydrOXYzine (ATARAX) tablet 25 mg   loperamide (IMODIUM) capsule  2-4 mg   LORazepam (ATIVAN) tablet 1 mg   Followed by   Melene Muller ON 12/14/2023] LORazepam (ATIVAN) tablet 1 mg   Followed by   Melene Muller ON 12/15/2023] LORazepam (ATIVAN) tablet 1 mg  Followed by   Melene Muller ON 12/16/2023] LORazepam (ATIVAN) tablet 1 mg   gabapentin (NEURONTIN) capsule 100 mg   PTA Medications  Medication Sig   acetaminophen (TYLENOL) 500 MG tablet Take 1,000 mg by mouth every 6 (six) hours as needed (for headaches).   ELDERBERRY IMMUNE HEALTH GUMMY 50-45-3.8 MG CHEW Chew 1-2 tablets by mouth daily.   Multiple Vitamins-Calcium (ONE-A-DAY WOMENS FORMULA) TABS Take 1 tablet by mouth daily with breakfast.   prochlorperazine (COMPAZINE) 10 MG tablet Take 1 tablet (10 mg total) by mouth every 6 (six) hours as needed for nausea or vomiting.   pantoprazole (PROTONIX) 40 MG tablet Take 1 tablet (40 mg total) by mouth 2 (two) times daily before a meal.   benzonatate (TESSALON) 200 MG capsule Take 1 capsule (200 mg total) by mouth 3 (three) times daily as needed for cough.   guaiFENesin-dextromethorphan (ROBITUSSIN DM) 100-10 MG/5ML syrup Take 5 mLs by mouth every 4 (four) hours as needed for cough.   cephALEXin (KEFLEX) 500 MG capsule Take 1 capsule (500 mg total) by mouth 4 (four) times daily.      Medical Decision Making  -37 yo female with past medical history of alcohol use disorder, tobacco use disorder, cannabis use disorder  presenting voluntarily to [facility]  requesting detox from alcohol.  Due to worsening of patient's withdrawal sxs, she was was promptly given 1x dose of Ativan PRN prior to collection of labs for stabilization of her symptoms.   Psychiatric Diagnoses: Alcohol use disorder, severe, dependence Tobacco use disorder Cannabis use disorder   Psychiatric Diagnoses and Treatment:  Alcohol Use Disorder -Ativan taper -Gabapentin 100 mg 3 times daily for seizure prophylaxis -CIWA with Ativan as needed for CIWA greater than 10 -Thiamine 100 mg IM first day and  PO after that -Multivitamin with minerals daily -Tylenol 650 mg every 6 hours as needed for pain -Zofran 4 mg every 6 hours as needed for nausea or vomiting -Imodium 2 to 4 mg as needed for diarrhea or loose stools  -Maalox/Mylanta 30 mL every 4 hours as needed for indigestion -Milk of Mag 30 mL as needed for constipation    Tobacco use disorder Encouraged smoking cessation NRT   Medical Issues Being Addressed: No   Labs Ordered: CBC, CMP, UA, UDS, EtOH, TSH, magnesium, urine pregnancy test  EKG pending. Has hx of prolonged QTC (550 in Nov 2024)   Disposition: Admit to observation if medically stable can transfer to [facility] tomorrow.   Recommendations  Based on my evaluation the patient does not appear to have an emergency medical condition.  Lorri Frederick, MD 12/12/23  4:45 PM provided with 1X dose of Ativan 1 mg

## 2023-12-13 ENCOUNTER — Inpatient Hospital Stay (HOSPITAL_COMMUNITY)
Admission: EM | Admit: 2023-12-13 | Discharge: 2023-12-18 | DRG: 897 | Disposition: A | Discharge status: Home / Self Care | Payer: No Typology Code available for payment source | Attending: Family Medicine | Admitting: Family Medicine

## 2023-12-13 ENCOUNTER — Encounter (HOSPITAL_COMMUNITY): Payer: Self-pay

## 2023-12-13 ENCOUNTER — Other Ambulatory Visit: Payer: Self-pay

## 2023-12-13 DIAGNOSIS — F17210 Nicotine dependence, cigarettes, uncomplicated: Secondary | ICD-10-CM | POA: Diagnosis present

## 2023-12-13 DIAGNOSIS — D72819 Decreased white blood cell count, unspecified: Secondary | ICD-10-CM | POA: Diagnosis present

## 2023-12-13 DIAGNOSIS — Y908 Blood alcohol level of 240 mg/100 ml or more: Secondary | ICD-10-CM | POA: Diagnosis present

## 2023-12-13 DIAGNOSIS — F419 Anxiety disorder, unspecified: Secondary | ICD-10-CM | POA: Insufficient documentation

## 2023-12-13 DIAGNOSIS — Z560 Unemployment, unspecified: Secondary | ICD-10-CM

## 2023-12-13 DIAGNOSIS — D509 Iron deficiency anemia, unspecified: Secondary | ICD-10-CM | POA: Diagnosis present

## 2023-12-13 DIAGNOSIS — F10230 Alcohol dependence with withdrawal, uncomplicated: Secondary | ICD-10-CM | POA: Diagnosis present

## 2023-12-13 DIAGNOSIS — Z79899 Other long term (current) drug therapy: Secondary | ICD-10-CM | POA: Diagnosis not present

## 2023-12-13 DIAGNOSIS — F1210 Cannabis abuse, uncomplicated: Secondary | ICD-10-CM | POA: Diagnosis present

## 2023-12-13 DIAGNOSIS — G8929 Other chronic pain: Secondary | ICD-10-CM | POA: Diagnosis present

## 2023-12-13 DIAGNOSIS — F1020 Alcohol dependence, uncomplicated: Secondary | ICD-10-CM | POA: Diagnosis not present

## 2023-12-13 DIAGNOSIS — F32A Depression, unspecified: Secondary | ICD-10-CM | POA: Diagnosis present

## 2023-12-13 DIAGNOSIS — Z5900 Homelessness unspecified: Secondary | ICD-10-CM

## 2023-12-13 DIAGNOSIS — D500 Iron deficiency anemia secondary to blood loss (chronic): Secondary | ICD-10-CM | POA: Diagnosis not present

## 2023-12-13 DIAGNOSIS — F10930 Alcohol use, unspecified with withdrawal, uncomplicated: Principal | ICD-10-CM

## 2023-12-13 DIAGNOSIS — D649 Anemia, unspecified: Secondary | ICD-10-CM | POA: Diagnosis present

## 2023-12-13 DIAGNOSIS — F10939 Alcohol use, unspecified with withdrawal, unspecified: Secondary | ICD-10-CM | POA: Diagnosis present

## 2023-12-13 DIAGNOSIS — Z720 Tobacco use: Secondary | ICD-10-CM | POA: Diagnosis present

## 2023-12-13 LAB — URINALYSIS, ROUTINE W REFLEX MICROSCOPIC
Bilirubin Urine: NEGATIVE
Glucose, UA: NEGATIVE mg/dL
Ketones, ur: 5 mg/dL — AB
Leukocytes,Ua: NEGATIVE
Nitrite: NEGATIVE
Protein, ur: 30 mg/dL — AB
RBC / HPF: >50 RBC/hpf (ref 0–5)
Specific Gravity, Urine: 1.033 — ABNORMAL HIGH (ref 1.005–1.030)
pH: 5 (ref 5.0–8.0)

## 2023-12-13 LAB — POC URINE PREG, ED: Preg Test, Ur: NEGATIVE

## 2023-12-13 LAB — RAPID URINE DRUG SCREEN, HOSP PERFORMED
Amphetamines: NOT DETECTED
Barbiturates: NOT DETECTED
Benzodiazepines: POSITIVE — AB
Cocaine: NOT DETECTED
Opiates: NOT DETECTED
Tetrahydrocannabinol: POSITIVE — AB

## 2023-12-13 LAB — BASIC METABOLIC PANEL
Anion gap: 9 (ref 5–15)
BUN: 8 mg/dL (ref 6–20)
CO2: 23 mmol/L (ref 22–32)
Calcium: 9.1 mg/dL (ref 8.9–10.3)
Chloride: 104 mmol/L (ref 98–111)
Creatinine, Ser: 0.94 mg/dL (ref 0.44–1.00)
GFR, Estimated: >60 mL/min (ref >60)
Glucose, Bld: 136 mg/dL — ABNORMAL HIGH (ref 70–99)
Potassium: 3.5 mmol/L (ref 3.5–5.1)
Sodium: 136 mmol/L (ref 135–145)

## 2023-12-13 LAB — MAGNESIUM: Magnesium: 1.9 mg/dL (ref 1.7–2.4)

## 2023-12-13 MED ORDER — THIAMINE MONONITRATE 100 MG PO TABS
100.0000 mg | ORAL_TABLET | Freq: Every day | ORAL | Status: DC
  Administered 2023-12-13 – 2023-12-18 (×6): 100 mg via ORAL
  Filled 2023-12-13 (×6): qty 1

## 2023-12-13 MED ORDER — LORAZEPAM 2 MG/ML IJ SOLN
2.0000 mg | Freq: Once | INTRAMUSCULAR | Status: AC
  Administered 2023-12-13: 2 mg via INTRAMUSCULAR
  Filled 2023-12-13: qty 1

## 2023-12-13 MED ORDER — POTASSIUM CHLORIDE CRYS ER 20 MEQ PO TBCR
40.0000 meq | EXTENDED_RELEASE_TABLET | Freq: Once | ORAL | Status: AC
  Administered 2023-12-13: 40 meq via ORAL
  Filled 2023-12-13: qty 2

## 2023-12-13 MED ORDER — LORAZEPAM 2 MG/ML IJ SOLN
1.0000 mg | INTRAMUSCULAR | Status: DC | PRN
  Administered 2023-12-14: 2 mg via INTRAVENOUS
  Filled 2023-12-13: qty 1

## 2023-12-13 MED ORDER — FOLIC ACID 1 MG PO TABS
1.0000 mg | ORAL_TABLET | Freq: Every day | ORAL | Status: DC
  Administered 2023-12-13 – 2023-12-18 (×6): 1 mg via ORAL
  Filled 2023-12-13 (×5): qty 1

## 2023-12-13 MED ORDER — ENOXAPARIN SODIUM 40 MG/0.4ML IJ SOSY
40.0000 mg | PREFILLED_SYRINGE | INTRAMUSCULAR | Status: DC
  Administered 2023-12-13 – 2023-12-17 (×5): 40 mg via SUBCUTANEOUS
  Filled 2023-12-13 (×5): qty 0.4

## 2023-12-13 MED ORDER — LORAZEPAM 1 MG PO TABS: 2.0000 mg | ORAL_TABLET | Freq: Once | ORAL | Status: AC

## 2023-12-13 MED ORDER — THIAMINE HCL 100 MG/ML IJ SOLN: 100.0000 mg | Freq: Every day | INTRAMUSCULAR | Status: DC

## 2023-12-13 MED ORDER — PHENOBARBITAL 32.4 MG PO TABS
32.4000 mg | ORAL_TABLET | Freq: Once | ORAL | Status: AC
  Administered 2023-12-13: 32.4 mg via ORAL
  Filled 2023-12-13: qty 1

## 2023-12-13 MED ORDER — LORAZEPAM 1 MG PO TABS
1.0000 mg | ORAL_TABLET | ORAL | Status: DC | PRN
  Administered 2023-12-13 – 2023-12-15 (×8): 1 mg via ORAL
  Filled 2023-12-13 (×8): qty 1

## 2023-12-13 MED ORDER — NICOTINE 14 MG/24HR TD PT24
14.0000 mg | MEDICATED_PATCH | Freq: Every day | TRANSDERMAL | Status: DC
  Administered 2023-12-14 – 2023-12-18 (×5): 14 mg via TRANSDERMAL
  Filled 2023-12-13 (×6): qty 1

## 2023-12-13 MED ORDER — SODIUM CHLORIDE 0.9 % IV SOLN
INTRAVENOUS | Status: DC
  Administered 2023-12-14: 1000 mL via INTRAVENOUS

## 2023-12-13 MED ORDER — ADULT MULTIVITAMIN W/MINERALS CH
1.0000 | ORAL_TABLET | Freq: Every day | ORAL | Status: DC
  Administered 2023-12-13 – 2023-12-15 (×3): 1 via ORAL
  Filled 2023-12-13 (×3): qty 1

## 2023-12-13 NOTE — Progress Notes (Signed)
 CSW received consult for patient for substance abuse resources - resources were added to AVS.  Edwin Dada, MSW, LCSW Transitions of Care  Clinical Social Worker II [phone]

## 2023-12-13 NOTE — ED Notes (Signed)
Safe transport calle to transport back to [facility] REport called to [facility]

## 2023-12-13 NOTE — Plan of Care (Signed)
FMTS Brief Progress Note  S: [name] is a 37 y.o. female with a history of alcohol use disorder w/ past withdrawal seizures presenting with alcohol withdrawal to [facility], admitted for medical withdrawal support.  Saw patient at bedside with Dr. Barb Merino.  Patient reports some shakiness, shivers, moderate tremor.  Endorses nausea, but no vomiting.  Prefers the darkness, has some sensitivity to light.  Correctly identifies self, location, month, and explains situation.  Does not report any pain. Endorses previously having alcohol withdrawal seizures.  O: BP 113/77   Pulse 95   Temp 99.2 F (37.3 C)   Resp 14   LMP 11/14/2023 (Exact Date)   SpO2 100%   General: Age-appropriate, huddled under covers in dark room, mild discomfort. HEENT: No scleral icterus.  PERRLA. Cardiovascular: Tachycardic rate and regular rhythm. Normal S1/S2. No murmurs, rubs, or gallops appreciated. 2+ radial pulses. Pulmonary: Clear bilaterally to ascultation. No increased WOB, no accessory muscle usage on room air. No wheezes, crackles, or rhonchi. Abdominal: No tenderness to deep or light palpation. No rebound or guarding. No HSM. Skin: Mildly clammy skin. Extremities: No peripheral edema bilaterally. Capillary refill <2 seconds. MSK: Moderate tremor with outstretched hands. Psych: Not agitated, resting under covers, shivering.  Polite, appropriate.  Full range affect, mildly anxious mood.  Alert to self, month, place, and situation.  A/P:  Alcohol withdrawal Stable, though tachycardic and tremors with outstretched arms, CIWA of 9 exam.  S/p phenobarb 32.4 mg @1050 , lorazepam 1 mg x2. -CIWAs Q2h, lorazepam PRN -Consult CCM for phenobarb drip if requiring lorazepam frequently and CIWAs staying persistently elevated -NS 75 mL/hr for 1 day, to expire ~1400 12/18 -Continue daily thiamine and folate  -Orders reviewed. Labs for AM ordered, which were adjusted as needed -If condition changes, plan includes CCM  consult for phenobarbital drip  Sharion Dove, Aron Needles, MD 12/13/2023, 9:08 PM PGY-1, [facility] Night Resident Please page [phone] with questions.

## 2023-12-13 NOTE — Assessment & Plan Note (Signed)
History of tobacco use. Patient would like nicotine patch. - Nicotine patch
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
## 2023-12-13 NOTE — Hospital Course (Addendum)
[name] is a 37 y.o.female with a history of alcohol use disorder who was admitted to the [facility] medicine Teaching Service at [facility] for alcohol withdrawal. Her hospital course is detailed below:  Alcohol withdrawal Patient presented to [facility] UC for voluntary alcohol withdrawal.  Reports last drink was 12/16 in the morning.  She was started on CIWA protocol with Ativan taper.  On the morning of 12/17, staff noted CIWA of 18 with somnolence.  Patient was given 2 mg of Ativan with mild improvement with CIWA of 12. [facility] staff did not feel comfortable with this so patient was transferred to ED.  In the ED provider gave her 1 dose of oral phenobarbital and consulted service for admission.  Patient was admitted with CIWA protocol and as needed Ativan.  Patient seen was ranged from 3-11 which was treated with ativan as needed. Patient experienced relief from this. Patient was discharged with naltrexone for alcohol cravings.  Anxiety: Patient was started on sertraline and atarax  BID for anxiety. Patient endorses improvement in anxiety prior to discharge.   Anemia Patient had some anemia during admission, with Hg dropping to 8.9. Patient does have history of IDA from heavy menstraul cycles. Patient remained asymptomatic. Started on iron supplementation. Consider IV iron transfusion outpatient     Other chronic conditions were medically managed with home medications and formulary alternatives as necessary (None)  PCP Follow-up Recommendations: Repeat CBC in 6-8 weeks, if low consider IV iron infusion outpatient Patient on new psych medication, follow up side effects and efficacy and titrate as needed   Follow up alcohol use disorder and use of naltrexone Follow up nicotine cessation  Consider outpatient imaging for suspected rotator cuff tear

## 2023-12-13 NOTE — ED Notes (Signed)
EMS here at this time to transport pt to [facility].  Pt stable at time of transfer.   EMTALA, MAR and Note given to EMS.

## 2023-12-13 NOTE — Assessment & Plan Note (Signed)
Patient with history of significant alcohol use with prior admission to ICU for withdrawal. Patient CIWA score 13 on my exam. Patient is s/p ativan in [facility] and one dose of phenobarbital in ED.  - Admit to FMTS, Attending: Dr. Linwood Dibbles, progressive  - CIWA q2h with ativan prn  - NS @ 75 ml/hr - MV, thiamine, and folic acid  - CCM - AM CBC, CMP, Mag, Phos - Regular diet  - TOC consult for substance use

## 2023-12-13 NOTE — Discharge Instructions (Addendum)
Dear [name],  Thank you for letting us participate in your care. You were hospitalized for alcohol detoxification and diagnosed with Alcohol withdrawal (HCC). You were treated with medication to aide you through withdrawal. We sent you home with a medication to take daily that will help with you cravings. We also started new medications for anxiety and depression. Your iron was low, likely due to your menses. We started you on iron supplements to take every other day. Please follow up with your PCP at [facility] Medicine at the date and time listed below.  If you would like to continue nicotine patches to help you stop smoking, you can get free patches if you call the tobacco quit line at 1-800-QUIT-NOW (9044899719)   POST-HOSPITAL & CARE INSTRUCTIONS Go to your follow up appointments (listed below)   DOCTOR'S APPOINTMENT   Future Appointments  Date Time Provider Department Center  12/22/2023  1:30 PM ACCESS TO CARE POOL [facility]  12/30/2023  9:25 AM Penne Lash, MD [facility]    Follow-up Information     Taten Merrow, MD. Go in 12 day(s).   Specialty: Family Medicine Why: 12/30/2023  9:25 AM Penne Lash, MD Contact information: [address] [place] [address] [phone]                 Take care and be well!  Family Medicine Teaching Service Inpatient Team [place]  [facility]  [address] [place], [place] [address] [phone]                                         Outpatient Substance Abuse  Treatment- uninsured  Narcotics Anonymous 24-HOUR HELPLINE Pre-recorded for Meeting Schedules [facility] [phone]  [email or web address] ALCOHOLICS ANONYMOUS  [facility]  Answering Service [phone] Please Note: All [place] Meetings are Non-smoking [email or web address].es  Alcohol and Drug Services -  Insurance: Medicaid /State funding/private insurance Methadone,  suboxone/Intensive outpatient  [place]   [phone] Fax: [phone] [address], [place], [place], [address] [place] [phone] Fax: [phone]    [address] [place], [place], [address] ([address] [place], [place], [place], [place], [place], [place], [place], [place]) [facility] [email or web address] Accepts State funding/Medicaid Transitional housing, Intensive Outpatient Treatment, Outpatient treatment, Veterans Services  Phone: [phone] Fax: [phone] Address: [address], [place] [address]   [employer] of Care (http://[email or web address].[email or web address]/) Insurance: Medicaid Case Management, Administrator, arts, Medication Management, Outpatient Therapy, Psychosocial Rehabilitation, Substance Abuse Intensive Outpatient  Phone: [phone] Fax: [phone] [address], [place], [place], [address]  [facility] Medicaid, most private insurance providers Types of Program: Individual/Group Therapy, Substance Abuse Treatment  Phone: [place] [phone] Fax: [phone] [address], [place], [place], [address] [place] [phone] [address] [place], [place], [address]  [facility]  Medicaid Types of Program: SAIOP  Phone: [phone] Fax: [phone] [address] [place], [place], [place], [address] [facility] Medicaid/state funds Crisis line [phone] [facility] [phone] [place] [phone] [place] [phone]  [facility] [address];  [place], [place] [address] [phone]  Substance Abuse Intensive Outpatient Program [facility] [address] [place], [place] [address] [phone]- Substance abuse treatment  Successful Transitions  Insurance:  [facility], [address], sliding scale Types of Program: substance abuse treatment, transportation assistance Phone:  [phone] Fax: [phone] Address: [address], [place] [address] [facility] (TrendSwap.[email or web address]) Insurance: UHC, [place], [place], [place] of [place] Program: addiction counseling, detoxification,  Phone: [phone]  Fax: [phone] Address: [address] [place], [place] [address]  [facility] (statewide facilities/programs) [address] (Medicaid/state funds) [place], [place] [address]                      http://[email or web address]/ [phone] Marcy Panning- [phone] [place]- [phone] [facility] [place] (2 Locations) (Medicaid/state funds) --[address]  walk in 8:30-12 and 1-2:30 [place], [id number]   [facility]- [phone] --[address] [place], [place] [address]  UD-149 [phone] walk in 8:30-12 and 2-3:30  [facility] state funds/medicaid [address] [place], [place] [address] [phone] [facility] (Suboxone clinic) Medicaid/state funds  [address]  [place], [place] [address] [phone]   [facility]  [address], [place], [place] [address]  [phone] (24 hours) [place]- [address] [place], [place] [address]  [phone] (24 hours) [place]- [address] [place] [phone] [place]- [address] [place] Levan [phone] [place] Daniels [address] [place] [phone] [facility]- Medicaid and state funds  [place]- [address] [place], [place] [address] [phone] (24 hours) [place]- [address] [place], [place] [address] [phone] [facility]- [address] [place], [place] [address] [phone] (24 hours) [place] [address] [place], [place]  [address] [phone] [place]- [address] [facility] [address] [facility] [place] [phone]

## 2023-12-13 NOTE — ED Triage Notes (Addendum)
Patient sent from [facility] for medical clearance. [facility] concerned that patient has intermittent tremor and given ativan 2mg  IM at 0845. Patient arrived ambulatory, alert and oriented. Patient there for 24 hours for etoh detox and this am first ativan received. Complains of headache.

## 2023-12-13 NOTE — ED Notes (Signed)
Patient observed/assessed in bed/chair resting quietly appearing in no distress and verbalizing no complaints at this time. Will continue to monitor.  

## 2023-12-13 NOTE — ED Notes (Signed)
Non Emergency EMS called for transport.

## 2023-12-13 NOTE — ED Provider Notes (Addendum)
[place] EMERGENCY DEPARTMENT AT [facility] Provider Note   CSN: [id number] Arrival date & time: 12/13/23  4098     History  No chief complaint on file.   [name] is a 37 y.o. female.  37 year old female here today from [facility] out of concern for alcohol withdrawal.  First received Ativan this morning.        Home Medications Prior to Admission medications   Medication Sig Start Date End Date Taking? Authorizing Provider  acetaminophen (TYLENOL) 500 MG tablet Take 1,000 mg by mouth every 6 (six) hours as needed (for headaches).    [provider]  ELDERBERRY IMMUNE HEALTH GUMMY 50-45-3.8 MG CHEW Chew 1-2 tablets by mouth daily.    [provider]  fluticasone (FLONASE) 50 MCG/ACT nasal spray Place 1 spray into both nostrils daily. 12/13/18 07/06/20  Aviva Kluver B, PA-C  omeprazole (PRILOSEC) 20 MG capsule Take 1 capsule (20 mg total) by mouth daily. Patient not taking: Reported on 12/13/2018 01/28/17 07/06/20  Glynn Octave, MD      Allergies    Patient has no known allergies.    Review of Systems   Review of Systems  Physical Exam Updated Vital Signs BP 124/72 (BP Location: Right Arm)   Pulse (!) 118   Temp 98.2 F (36.8 C) (Oral)   Resp 20   LMP 11/14/2023 (Exact Date)   SpO2 95%  Physical Exam Vitals reviewed.  HENT:     Head: Normocephalic.     Mouth/Throat:     Comments: Fasciculations Cardiovascular:     Rate and Rhythm: Tachycardia present.  Pulmonary:     Effort: Pulmonary effort is normal.  Skin:    General: Skin is warm and dry.  Neurological:     General: No focal deficit present.     Mental Status: She is alert.     ED Results / Procedures / Treatments   Labs (all labs ordered are listed, but only abnormal results are displayed) Labs Reviewed - No data to display  EKG None  Radiology No results found.  Procedures Procedures    Medications Ordered in ED Medications  PHENobarbital  (LUMINAL) tablet 32.4 mg (32.4 mg Oral Given 12/13/23 1050)    ED Course/ Medical Decision Making/ A&P                                 Medical Decision Making 37 year old female here today for alcohol withdrawal.  Plan-patient labs drawn yesterday, which I reviewed.  Do not believe repeat labs are indicated.  Patient appears of gotten a little bit behind on her benzodiazepines at facility.  Provided the patient with oral phenobarbital here in the emergency department.  Fasciculations improved, heart rate improved.  Patient overall looks better, stated that she did feel a bit better.  She is going to return to [facility] from the ED.  Reassessment 1 PM- [facility] is requesting observation admission for the patient due to her prior history of delirium tremens, alcohol withdrawal seizures.  Will maintain patient on her CIWA scale.  Will admit for observation.  Risk Prescription drug management. Decision regarding hospitalization.           Final Clinical Impression(s) / ED Diagnoses Final diagnoses:  Alcohol withdrawal syndrome without complication [facility])    Rx / DC Orders ED Discharge Orders     None         Arletha Pili, DO  12/13/23 1153    Anders Simmonds T, DO 12/13/23 1301

## 2023-12-13 NOTE — ED Provider Notes (Signed)
Notified by staff that patient had scored an 18 on her CIWA earlier and received 1 mg of Ativan.  CIWA score was still 12.  Due to her history of withdrawal seizures a further 2 mg of Ativan was ordered and patient is being transferred to the ED.  Dr. Sindy Messing is calling the ED provider to discuss patient.   Plan: -Give 2 mg Ativan PO or IM once.   Arna Snipe MD Resident

## 2023-12-13 NOTE — ED Notes (Signed)
ED TO INPATIENT HANDOFF REPORT  ED Nurse Name and Phone #: Raynelle Fanning, RN  S Name/Age/Gender [name] 37 y.o. female Room/Bed: 005C/005C  Code Status   Code Status: Full Code  Home/SNF/Other Rehab Patient oriented to: self, place, time, and situation Is this baseline? Yes   Triage Complete: Triage complete  Chief Complaint Alcohol withdrawal [facility]) [F10.939]  Triage Note Patient sent from [facility] for medical clearance. [facility] concerned that patient has intermittent tremor and given ativan 2mg  IM at 0845. Patient arrived ambulatory, alert and oriented. Patient there for 24 hours for etoh detox and this am first ativan received. Complains of headache.    Allergies No Known Allergies  Level of Care/Admitting Diagnosis ED Disposition     ED Disposition  Admit   Condition  --   Comment  Hospital Area: [facility] [[id number]]  Level of Care: Progressive [102]  Admit to Progressive based on following criteria: ACUTE MENTAL DISORDER-RELATED Drug/Alcohol Ingestion/Overdose/Withdrawal, Suicidal Ideation/attempt requiring safety sitter and < Q2h monitoring/assessments, moderate to severe agitation that is managed with medication/sitter, CIWA-Ar score < 20.  May admit patient to [facility] or [facility] if equivalent level of care is available:: No  Covid Evaluation: Asymptomatic - no recent exposure (last 10 days) testing not required  Diagnosis: Alcohol withdrawal (HCC) [291.81.ICD-9-CM]  Admitting Physician: Penne Lash [[id number]]  Attending Physician: Caro Laroche [[id number]]  Certification:: I certify this patient will need inpatient services for at least 2 midnights  Expected Medical Readiness: 12/15/2023          B Medical/Surgery History Past Medical History:  Diagnosis Date   Abdominal pain, epigastric 04/17/2013   Adjustment disorder with emotional disturbance    Alcoholic gastritis 03/2013    hospitalization   Dehydration 04/04/2013    Hematemesis 03/2013   along with alcoholic gastritis, hospitalization   Hematemesis- mild 04/04/2013   Intractable nausea, vomiting & abdominal pain. 04/04/2013   Metabolic acidosis 03/2013    hospitaliation due to alcohol abuse, dehyration, nausea/vomiting   Metabolic acidosis, increased anion gap 04/04/2013   Polysubstance abuse (HCC)    Pyelonephritis 03/2010   hospitalization   Seizures (HCC)    Stomach ulcer    never had any tests to confirm this diagni=oses   Past Surgical History:  Procedure Laterality Date   BIOPSY  09/10/2023   Procedure: BIOPSY;  Surgeon: Willis Modena, MD;  Location: [facility];  Service: Gastroenterology;;   BREAST LUMPECTOMY Right    benign pathology   CESAREAN SECTION  2005   ESOPHAGOGASTRODUODENOSCOPY (EGD) WITH PROPOFOL N/A 09/10/2023   Procedure: ESOPHAGOGASTRODUODENOSCOPY (EGD) WITH PROPOFOL;  Surgeon: Willis Modena, MD;  Location: [facility];  Service: Gastroenterology;  Laterality: N/A;   MOUTH SURGERY       A IV Location/Drains/Wounds Patient Lines/Drains/Airways Status     Active Line/Drains/Airways     Name Placement date Placement time Site Days   Peripheral IV 12/13/23 20 G Anterior;Right Forearm 12/13/23  1537  Forearm  less than 1            Intake/Output Last 24 hours No intake or output data in the 24 hours ending 12/13/23 2224  Labs/Imaging Results for orders placed or performed during the hospital encounter of 12/13/23 (from the past 48 hours)  Magnesium     Status: None   Collection Time: 12/13/23  7:58 PM  Result Value Ref Range   Magnesium 1.9 1.7 - 2.4 mg/dL    Comment: Performed at [facility], [address], [place], [place] [address]  Basic metabolic panel     Status: Abnormal   Collection Time: 12/13/23  7:58 PM  Result Value Ref Range   Sodium 136 135 - 145 mmol/L   Potassium 3.5 3.5 - 5.1 mmol/L   Chloride 104 98 - 111 mmol/L   CO2 23 22 - 32 mmol/L   Glucose, Bld 136 (H) 70 - 99 mg/dL     Comment: Glucose reference range applies only to samples taken after fasting for at least 8 hours.   BUN 8 6 - 20 mg/dL   Creatinine, Ser 0.10 0.44 - 1.00 mg/dL   Calcium 9.1 8.9 - 27.2 mg/dL   GFR, Estimated >53 >66 mL/min    Comment: (NOTE) Calculated using the CKD-EPI Creatinine Equation (2021)    Anion gap 9 5 - 15    Comment: Performed at [facility], [address] [place], [place] [address]   No results found.  Pending Labs Unresulted Labs (From admission, onward)     Start     Ordered   12/14/23 0500  Comprehensive metabolic panel  Tomorrow morning,   R        12/13/23 1438   12/14/23 0500  CBC  Tomorrow morning,   R        12/13/23 1438   12/14/23 0500  Folate  Tomorrow morning,   R        12/13/23 1438   12/14/23 0500  Phosphorus  Tomorrow morning,   R        12/13/23 1438   12/13/23 1336  Rapid urine drug screen (hospital performed)  ONCE - STAT,   STAT        12/13/23 1335            Vitals/Pain Today's Vitals   12/13/23 2030 12/13/23 2100 12/13/23 2130 12/13/23 2200  BP: 111/65 113/77 99/72 111/73  Pulse: 91 95 96 91  Resp: (!) 29 14 (!) 24   Temp:      TempSrc:      SpO2: 100% 100% 100% 100%  PainSc:        Isolation Precautions No active isolations  Medications Medications  LORazepam (ATIVAN) tablet 1-4 mg (1 mg Oral Given 12/13/23 1932)    Or  LORazepam (ATIVAN) injection 1-4 mg ( Intravenous See Alternative 12/13/23 1932)  thiamine (VITAMIN B1) tablet 100 mg (100 mg Oral Given 12/13/23 1523)    Or  thiamine (VITAMIN B1) injection 100 mg ( Intravenous See Alternative 12/13/23 1523)  folic acid (FOLVITE) tablet 1 mg (1 mg Oral Given 12/13/23 1523)  multivitamin with minerals tablet 1 tablet (1 tablet Oral Given 12/13/23 1523)  enoxaparin (LOVENOX) injection 40 mg (40 mg Subcutaneous Given 12/13/23 1524)  nicotine (NICODERM CQ - dosed in mg/24 hours) patch 14 mg (14 mg Transdermal Not Given 12/13/23 1525)  0.9 %  sodium chloride  infusion ( Intravenous New Bag/Given 12/13/23 1537)  PHENobarbital (LUMINAL) tablet 32.4 mg (32.4 mg Oral Given 12/13/23 1050)  potassium chloride SA (KLOR-CON M) CR tablet 40 mEq (40 mEq Oral Given 12/13/23 1523)    Mobility walks     Focused Assessments Cardiac Assessment Handoff:    No results found for: "CKTOTAL", "CKMB", "CKMBINDEX", "TROPONINI" No results found for: "DDIMER" Does the Patient currently have chest pain? No    R Recommendations: See Admitting Provider Note  Report given to:   Additional Notes: Pt is being admitted for alcohol withdrawal. Pt sent from [facility] for withdrawals.  Pt A&O x 4

## 2023-12-13 NOTE — H&P (Addendum)
Hospital Admission History and Physical Service Pager: [phone]  Patient name: [name] Medical record number: [id number] Date of Birth: Jan 22, 1986 Age: 37 y.o. Gender: female  Primary Care Provider: Pcp, No Consultants: None Code Status: Full Code   Preferred Emergency Contact:  Contact Information     Name Relation Home Work Mobile   [place] Mother [phone]  [phone]   Cooke,Ricky Significant other [phone]  [phone]      Other Contacts   None on File      Chief Complaint: Alcohol withdrawal   Assessment and Plan: [name] is a 37 y.o. female presenting from [facility] for symptoms of alcohol withdrawal. Differential includes mild alcohol withdrawal, alcohol withdrawal associated seizures, anxiety disorder, substance use.  Given patient presented to be [facility] wanting to safely withdraw from alcohol with blood ethanol level of 267, endorses almost daily alcohol use and has history of withdrawal seizures, patient is likely in alcohol withdrawal. Patient has not had any seizure like activity, less worried for more serious consequence of withdrawal at this time such as seizures or DT. Patient does have history of anxiety disorder, however given patient's alcohol level and history, it is likely her symptoms are related to this and not just anxiety disorder. Patient endorses THC use but denies any other illicit substances. UDS positive for THC and benzos, but patient had received ativan at [facility]. Given patients history of ICU care for alcohol withdrawal, will admit patient to progressive for monitoring and treatment of alcohol withdrawal.  Assessment & Plan Alcohol withdrawal (HCC) Patient with history of significant alcohol use with prior admission to ICU for withdrawal. Patient CIWA score 13 on my exam. Patient is s/p ativan in [facility] and one dose of phenobarbital in ED.  - Admit to FMTS, Attending: Dr. Linwood Dibbles, progressive  - CIWA q2h with ativan prn   - NS @ 75 ml/hr - MV, thiamine, and folic acid  - CCM - AM CBC, CMP, Mag, Phos - Regular diet  - TOC consult for substance use  Tobacco use History of tobacco use. Patient would like nicotine patch. - Nicotine patch   Chronic and Stable Conditions: None  FEN/GI: Regular VTE Prophylaxis: Lovenox  Disposition: Progressive   History of Present Illness:  [name] is a 37 y.o. female presenting from [facility] for alcohol withdrawal. Reports she went to [facility] because she wanted to make a change and wants help with withdrawal. Last drink was early 12/16 AM. Usually has 1/5 in a sitting. Drinks wine and hard liquor. [facility] was full and she was sharing room with multiple people and this caused her anxiety. Reports history of seizure from withdrawal and admission to hospital for 8 days with two days in the ICU. Today, complaining of headache that is mildly improved after medication, anxiety and shakiness. Denies any SI/HI.   Went to [facility] 12/16. Started on CIWAs with ativan taper and gapapentin. [facility] staff noted patient was more lethargic this AM with CIWA of 18, so gave patient 2 mg ativan and transferred patient to [facility] ED.In the ED, patient was given phenobarbital and team was called for admission.   Review Of Systems: Per HPI with the following additions: denies chest pain, nausea or vomiting. Reports trouble breathing, mild tremors and high anxiety. Denies auditory of visual hallucinations.   Pertinent Past Medical History: Substance abuse   Remainder reviewed in history tab.   Pertinent Past Surgical History: C-section Breast lump removed   Remainder reviewed in history tab.  Pertinent Social History: Tobacco use: Yes, 1 pack per 2-3 days, smoking since 37 yo Alcohol use: Yes Other Substance use: THC daily Lives with Mom  Pertinent Family History: Father and Grandfather struggled with EtOH abuse  Remainder reviewed in history tab.   Important Outpatient  Medications: Multivitamin  Remainder reviewed in medication history.   Objective: BP 124/72 (BP Location: Right Arm)   Pulse (!) 118   Temp 98.2 F (36.8 C) (Oral)   Resp 20   LMP 11/14/2023 (Exact Date)   SpO2 95%  Exam: General: ill appearing woman, appears stated age, NAD Eyes: EOM intact  HEENT: no obvious trauma, normocephalic  Cardiovascular: tachycardic, no m/r/g Respiratory: CTAB, NWOB on RA Gastrointestinal: soft, NTND MSK: tremulous bilaterally with arm extension, dry  Neuro: A&Ox4, no focal deficits. Gait unsteady due to trembling  Psych: logical thought processes, memory intact, judgement fair,   Labs:  CBC   Recent Labs  Lab 12/12/23 1640  WBC 3.8*  HGB 11.3*  HCT 35.5*  PLT 319         Latest Ref Rng & Units 12/12/2023    4:40 PM      Glucose 70 - 99 mg/dL 295     BUN 6 - 20 mg/dL 5     Creatinine 6.21 - 1.00 mg/dL 3.08     Sodium 657 - 846 mmol/L 143     Potassium 3.5 - 5.1 mmol/L 3.2     Chloride 98 - 111 mmol/L 102     CO2 22 - 32 mmol/L 24     Calcium 8.9 - 10.3 mg/dL 9.4     Total Protein 6.5 - 8.1 g/dL 9.0     Total Bilirubin <1.2 mg/dL 0.5     Alkaline Phos 38 - 126 U/L 106     AST 15 - 41 U/L 61     ALT 0 - 44 U/L 36      Ethanol: 267  UDS:  Opiates NONE DETECTED        Cocaine NONE DETECTED        Benzodiazepines POSITIVE Abnormal         Amphetamines NONE DETECTED        Tetrahydrocannabinol POSITIVE Abnormal          TSH: 0.656  Urinalysis:  EKG: My own interpretation (not copied from electronic read)  Sinus tachycardia without ST elevation    Imaging Studies Performed: No imaging done   Hal Morales MD 12/13/2023, 1:55 PM PGY-1, [facility]  FPTS Intern pager: [phone], text pages welcome Secure chat group [facility] Teaching Service   Upper Level Addendum: I have seen and evaluated this patient along with Dr. Georg Ruddle and reviewed the above note, making necessary revisions as  appropriate. I agree with the medical decision making and physical exam as noted above. Bess Kinds, MD PGY-2 [facility] Residency

## 2023-12-13 NOTE — ED Notes (Signed)
POCT Urine drug screen positive for Benzo's and THC

## 2023-12-13 NOTE — Discharge Instructions (Signed)
You have been transferred to the emergency department for further stabilization due to worsening symptoms of alcohol withdrawal, including increasing CIWA scores, confusion, and poor oral intake. Once you are stabilized you can return to the [facility].

## 2023-12-13 NOTE — ED Notes (Signed)
Dash contact for urine pick up.

## 2023-12-14 DIAGNOSIS — F10930 Alcohol use, unspecified with withdrawal, uncomplicated: Secondary | ICD-10-CM | POA: Diagnosis not present

## 2023-12-14 LAB — CBC
HCT: 27.9 % — ABNORMAL LOW (ref 36.0–46.0)
HCT: 27.3 % — ABNORMAL LOW (ref 36.0–46.0)
Hemoglobin: 8.9 g/dL — ABNORMAL LOW (ref 12.0–15.0)
Hemoglobin: 8.8 g/dL — ABNORMAL LOW (ref 12.0–15.0)
MCH: 25.7 pg — ABNORMAL LOW (ref 26.0–34.0)
MCH: 25.9 pg — ABNORMAL LOW (ref 26.0–34.0)
MCHC: 31.9 g/dL (ref 30.0–36.0)
MCHC: 32.2 g/dL (ref 30.0–36.0)
MCV: 80.6 fL (ref 80.0–100.0)
MCV: 80.3 fL (ref 80.0–100.0)
Platelets: 245 10*3/uL (ref 150–400)
Platelets: 238 10*3/uL (ref 150–400)
RBC: 3.46 MIL/uL — ABNORMAL LOW (ref 3.87–5.11)
RBC: 3.4 MIL/uL — ABNORMAL LOW (ref 3.87–5.11)
RDW: 20.1 % — ABNORMAL HIGH (ref 11.5–15.5)
RDW: 20 % — ABNORMAL HIGH (ref 11.5–15.5)
WBC: 3.5 10*3/uL — ABNORMAL LOW (ref 4.0–10.5)
WBC: 5.1 10*3/uL (ref 4.0–10.5)
nRBC: 0 % (ref 0.0–0.2)
nRBC: 0 % (ref 0.0–0.2)

## 2023-12-14 LAB — COMPREHENSIVE METABOLIC PANEL
ALT: 24 U/L (ref 0–44)
AST: 33 U/L (ref 15–41)
Albumin: 3.4 g/dL — ABNORMAL LOW (ref 3.5–5.0)
Alkaline Phosphatase: 70 U/L (ref 38–126)
Anion gap: 7 (ref 5–15)
BUN: 8 mg/dL (ref 6–20)
CO2: 22 mmol/L (ref 22–32)
Calcium: 8.6 mg/dL — ABNORMAL LOW (ref 8.9–10.3)
Chloride: 107 mmol/L (ref 98–111)
Creatinine, Ser: 0.67 mg/dL (ref 0.44–1.00)
GFR, Estimated: >60 mL/min (ref >60)
Glucose, Bld: 80 mg/dL (ref 70–99)
Potassium: 3.8 mmol/L (ref 3.5–5.1)
Sodium: 136 mmol/L (ref 135–145)
Total Bilirubin: 0.6 mg/dL (ref <1.2)
Total Protein: 6.3 g/dL — ABNORMAL LOW (ref 6.5–8.1)

## 2023-12-14 LAB — PHOSPHORUS: Phosphorus: 3.2 mg/dL (ref 2.5–4.6)

## 2023-12-14 LAB — IRON AND TIBC
Iron: 119 ug/dL (ref 28–170)
Saturation Ratios: 21 % (ref 10.4–31.8)
TIBC: 577 ug/dL — ABNORMAL HIGH (ref 250–450)
UIBC: 458 ug/dL

## 2023-12-14 LAB — FERRITIN: Ferritin: 23 ng/mL (ref 11–307)

## 2023-12-14 LAB — FOLATE: Folate: 10.9 ng/mL (ref >5.9)

## 2023-12-14 MED ORDER — FERROUS SULFATE 325 (65 FE) MG PO TABS
325.0000 mg | ORAL_TABLET | ORAL | Status: DC
  Administered 2023-12-14 – 2023-12-18 (×3): 325 mg via ORAL
  Filled 2023-12-14 (×4): qty 1

## 2023-12-14 MED ORDER — ACETAMINOPHEN 500 MG PO TABS
1000.0000 mg | ORAL_TABLET | Freq: Four times a day (QID) | ORAL | Status: DC | PRN
  Administered 2023-12-14 – 2023-12-17 (×3): 1000 mg via ORAL
  Filled 2023-12-14 (×3): qty 2

## 2023-12-14 NOTE — Assessment & Plan Note (Addendum)
Patient with hemoglobin drop from 11.3 on 12/16 to 8.2 on this morning's labs.  Large RBCs on UA and patient endorses that she is currently on her menstrual cycle.  Will obtain a.m. CBC and iron panel.  Patient denies any bloody stools or hematemesis - AM CBC - iron panel - CTM

## 2023-12-14 NOTE — ED Notes (Signed)
ED TO INPATIENT HANDOFF REPORT  ED Nurse Name and Phone #: [name] [phone]  S Name/Age/Gender Lucina Mellow 37 y.o. female Room/Bed: 005C/005C  Code Status   Code Status: Full Code  Home/SNF/Other Home Patient oriented to: self, place, time, and situation Is this baseline? Yes   Triage Complete: Triage complete  Chief Complaint Alcohol withdrawal [facility]) [F10.939]  Triage Note Patient sent from [facility] for medical clearance. [facility] concerned that patient has intermittent tremor and given ativan 2mg  IM at 0845. Patient arrived ambulatory, alert and oriented. Patient there for 24 hours for etoh detox and this am first ativan received. Complains of headache.    Allergies No Known Allergies  Level of Care/Admitting Diagnosis ED Disposition     ED Disposition  Admit   Condition  --   Comment  Hospital Area: [facility] [[id number]]  Level of Care: Progressive [102]  Admit to Progressive based on following criteria: ACUTE MENTAL DISORDER-RELATED Drug/Alcohol Ingestion/Overdose/Withdrawal, Suicidal Ideation/attempt requiring safety sitter and < Q2h monitoring/assessments, moderate to severe agitation that is managed with medication/sitter, CIWA-Ar score < 20.  May admit patient to [facility] or [facility] if equivalent level of care is available:: No  Covid Evaluation: Asymptomatic - no recent exposure (last 10 days) testing not required  Diagnosis: Alcohol withdrawal (HCC) [291.81.ICD-9-CM]  Admitting Physician: Penne Lash [[id number]]  Attending Physician: Caro Laroche [[id number]]  Certification:: I certify this patient will need inpatient services for at least 2 midnights  Expected Medical Readiness: 12/15/2023          B Medical/Surgery History Past Medical History:  Diagnosis Date   Abdominal pain, epigastric 04/17/2013   Adjustment disorder with emotional disturbance    Alcoholic gastritis 03/2013    hospitalization   Dehydration 04/04/2013    Hematemesis 03/2013   along with alcoholic gastritis, hospitalization   Hematemesis- mild 04/04/2013   Intractable nausea, vomiting & abdominal pain. 04/04/2013   Metabolic acidosis 03/2013    hospitaliation due to alcohol abuse, dehyration, nausea/vomiting   Metabolic acidosis, increased anion gap 04/04/2013   Polysubstance abuse (HCC)    Pyelonephritis 03/2010   hospitalization   Seizures (HCC)    Stomach ulcer    never had any tests to confirm this diagni=oses   Past Surgical History:  Procedure Laterality Date   BIOPSY  09/10/2023   Procedure: BIOPSY;  Surgeon: Willis Modena, MD;  Location: [facility];  Service: Gastroenterology;;   BREAST LUMPECTOMY Right    benign pathology   CESAREAN SECTION  2005   ESOPHAGOGASTRODUODENOSCOPY (EGD) WITH PROPOFOL N/A 09/10/2023   Procedure: ESOPHAGOGASTRODUODENOSCOPY (EGD) WITH PROPOFOL;  Surgeon: Willis Modena, MD;  Location: [facility];  Service: Gastroenterology;  Laterality: N/A;   MOUTH SURGERY       A IV Location/Drains/Wounds Patient Lines/Drains/Airways Status     Active Line/Drains/Airways     Name Placement date Placement time Site Days   Peripheral IV 12/13/23 20 G Anterior;Right Forearm 12/13/23  1537  Forearm  1            Intake/Output Last 24 hours  Intake/Output Summary (Last 24 hours) at 12/14/2023 1302 Last data filed at 12/14/2023 1026 Gross per 24 hour  Intake 1248.57 ml  Output --  Net 1248.57 ml    Labs/Imaging Results for orders placed or performed during the hospital encounter of 12/13/23 (from the past 48 hours)  Magnesium     Status: None   Collection Time: 12/13/23  7:58 PM  Result Value Ref Range  Magnesium 1.9 1.7 - 2.4 mg/dL    Comment: Performed at [facility], [address] [place], [place] [address]  Basic metabolic panel     Status: Abnormal   Collection Time: 12/13/23  7:58 PM  Result Value Ref Range   Sodium 136 135 - 145 mmol/L   Potassium 3.5 3.5 - 5.1 mmol/L   Chloride  104 98 - 111 mmol/L   CO2 23 22 - 32 mmol/L   Glucose, Bld 136 (H) 70 - 99 mg/dL    Comment: Glucose reference range applies only to samples taken after fasting for at least 8 hours.   BUN 8 6 - 20 mg/dL   Creatinine, Ser 6.04 0.44 - 1.00 mg/dL   Calcium 9.1 8.9 - 54.0 mg/dL   GFR, Estimated >98 >11 mL/min    Comment: (NOTE) Calculated using the CKD-EPI Creatinine Equation (2021)    Anion gap 9 5 - 15    Comment: Performed at [facility], [address] [place], [place] [address]  Comprehensive metabolic panel     Status: Abnormal   Collection Time: 12/14/23  4:34 AM  Result Value Ref Range   Sodium 136 135 - 145 mmol/L   Potassium 3.8 3.5 - 5.1 mmol/L   Chloride 107 98 - 111 mmol/L   CO2 22 22 - 32 mmol/L   Glucose, Bld 80 70 - 99 mg/dL    Comment: Glucose reference range applies only to samples taken after fasting for at least 8 hours.   BUN 8 6 - 20 mg/dL   Creatinine, Ser 2.95 0.44 - 1.00 mg/dL   Calcium 8.6 (L) 8.9 - 10.3 mg/dL   Total Protein 6.3 (L) 6.5 - 8.1 g/dL   Albumin 3.4 (L) 3.5 - 5.0 g/dL   AST 33 15 - 41 U/L   ALT 24 0 - 44 U/L   Alkaline Phosphatase 70 38 - 126 U/L   Total Bilirubin 0.6 <1.2 mg/dL   GFR, Estimated >62 >13 mL/min    Comment: (NOTE) Calculated using the CKD-EPI Creatinine Equation (2021)    Anion gap 7 5 - 15    Comment: Performed at [facility], [address], [place], [place] [address]  CBC     Status: Abnormal   Collection Time: 12/14/23  4:34 AM  Result Value Ref Range   WBC 3.5 (L) 4.0 - 10.5 K/uL   RBC 3.46 (L) 3.87 - 5.11 MIL/uL   Hemoglobin 8.9 (L) 12.0 - 15.0 g/dL   HCT 84.6 (L) 96.2 - 95.2 %   MCV 80.6 80.0 - 100.0 fL   MCH 25.7 (L) 26.0 - 34.0 pg   MCHC 31.9 30.0 - 36.0 g/dL   RDW 84.1 (H) 32.4 - 40.1 %   Platelets 245 150 - 400 K/uL   nRBC 0.0 0.0 - 0.2 %    Comment: Performed at [facility], [address] [place], [place] [address]  Folate     Status: None   Collection Time: 12/14/23  4:34 AM   Result Value Ref Range   Folate 10.9 >5.9 ng/mL    Comment: Performed at [facility], [address], [place], [place] [address]  Phosphorus     Status: None   Collection Time: 12/14/23  4:34 AM  Result Value Ref Range   Phosphorus 3.2 2.5 - 4.6 mg/dL    Comment: Performed at [facility], [address] [place], [place] [address]  No results found.  Pending Labs Unresulted Labs (From admission, onward)     Start     Ordered   12/15/23 0500  CBC  Tomorrow morning,   R        12/14/23 1012   12/15/23 0500  Basic metabolic panel  Tomorrow morning,   R        12/14/23 1012   12/14/23 1500  CBC  Once,   R        12/14/23 1011   12/14/23 1500  Iron and TIBC  Once,   R        12/14/23 1011   12/13/23 1336  Rapid urine drug screen (hospital performed)  ONCE - STAT,   STAT        12/13/23 1335            Vitals/Pain Today's Vitals   12/14/23 0854 12/14/23 0854 12/14/23 1100 12/14/23 1124  BP:    104/72  Pulse:   65 65  Resp:   (!) 28 18  Temp: 98.4 F (36.9 C)   98.4 F (36.9 C)  TempSrc: Oral   Oral  SpO2:   100% 100%  Weight:  63.5 kg    Height:  5\' 2"  (1.575 m)    PainSc:        Isolation Precautions No active isolations  Medications Medications  LORazepam (ATIVAN) tablet 1-4 mg (1 mg Oral Given 12/14/23 0801)    Or  LORazepam (ATIVAN) injection 1-4 mg ( Intravenous See Alternative 12/14/23 0801)  thiamine (VITAMIN B1) tablet 100 mg (100 mg Oral Given 12/14/23 0955)    Or  thiamine (VITAMIN B1) injection 100 mg ( Intravenous See Alternative 12/14/23 0955)  folic acid (FOLVITE) tablet 1 mg (1 mg Oral Given 12/14/23 0955)  multivitamin with minerals tablet 1 tablet (1 tablet Oral Given 12/14/23 0955)  enoxaparin (LOVENOX) injection 40 mg (40 mg Subcutaneous Given 12/13/23 1524)  nicotine (NICODERM CQ - dosed in mg/24 hours) patch 14 mg (14 mg Transdermal Patch Applied 12/14/23 0954)  PHENobarbital (LUMINAL) tablet 32.4 mg (32.4 mg Oral Given  12/13/23 1050)  potassium chloride SA (KLOR-CON M) CR tablet 40 mEq (40 mEq Oral Given 12/13/23 1523)    Mobility walks     Focused Assessments Neuro Assessment Handoff:  Swallow screen pass? Yes    NIH Stroke Scale  Dizziness Present: No Headache Present: No Interval: Shift assessment Level of Consciousness (1a.)   : Alert, keenly responsive LOC Questions (1b. )   : Answers both questions correctly LOC Commands (1c. )   : Performs both tasks correctly Best Gaze (2. )  : Normal Visual (3. )  : No visual loss Facial Palsy (4. )    : Normal symmetrical movements Motor Arm, Left (5a. )   : No drift Motor Arm, Right (5b. ) : No drift Motor Leg, Left (6a. )  : No drift Motor Leg, Right (6b. ) : No drift Limb Ataxia (7. ): Absent Sensory (8. )  : Normal, no sensory loss Best Language (9. )  : No aphasia Dysarthria (10. ): Normal Extinction/Inattention (11.)   : No Abnormality Complete NIHSS TOTAL: 0     Neuro Assessment: Within Defined Limits Neuro Checks:   Shift assessment (12/13/23 1915)  Has TPA been given? No If patient is a Neuro Trauma and patient is going to OR before floor call report to 4N Charge nurse: [phone] or [phone]   R Recommendations: See Admitting Provider Note  Report given  to:   Additional Notes: Showered this morning

## 2023-12-14 NOTE — Progress Notes (Signed)
     Daily Progress Note Intern Pager: [phone]  Patient name: [name] Medical record number: [id number] Date of birth: 1986-12-24 Age: 37 y.o. Gender: female  Primary Care Provider: Pcp, No Consultants: None Code Status: Full  Pt Overview and Major Events to Date:  12/18: admitted   Assessment and Plan: Patient is a 37 year old female with past medical history of tobacco use disorder, alcohol use disorder with history of withdrawal seizures in 2013 who presented from [facility] for detox.  Overnight patient CIWA were between 3-11 with total of 5 mg ativan since admission.  Hemoglobin dropped from 11.3-8.2.  Patient does have history of anemia per chart review with hemoglobins in the mid to high 8s.  Patient does being on her menstrual cycle and a heavy menstrual flow.  Will follow-up afternoon hemoglobin and order iron studies. Assessment & Plan Alcohol withdrawal (HCC)  - CIWA q2h with ativan prn  - d/c NS @ 75 ml/hr - MV, thiamine, and folic acid  - CCM - AM CBC, CMP, Mag, Phos - Regular diet  - TOC consult for substance use, following  Tobacco use History of tobacco use. Patient would like nicotine patch. - Nicotine patch  Anemia Patient with hemoglobin drop from 11.3 on 12/16 to 8.2 on this morning's labs.  Large RBCs on UA and patient endorses that she is currently on her menstrual cycle.  Will obtain a.m. CBC and iron panel.  Patient denies any bloody stools or hematemesis - AM CBC - iron panel - CTM     Chronic and Stable Problems:  None   FEN/GI: Regular PPx: Lovenox Dispo: [facility] C versus home when stable Subjective:  Reports she is just sad today. Thinks medication is helping but is still a little anxious and tremulous   Objective: Temp:  [98 F (36.7 C)-99.4 F (37.4 C)] 98.3 F (36.8 C) (12/18 0427) Pulse Rate:  [69-118] 79 (12/18 0600) Resp:  [14-29] 20 (12/18 0600) BP: (94-135)/(50-99) 98/71 (12/18 0600) SpO2:  [95 %-100 %] 100 % (12/18  0600) Physical Exam: General: NAD, resting comfortably  Cardiovascular: Regular rate and rhythm, no murmurs rubs or gallops Respiratory: Clear to auscultation bilaterally, normal work of breathing on room air Abdomen: Nontender nondistended Extremities: No peripheral edema noted  Laboratory: Most recent CBC Lab Results  Component Value Date   WBC 3.5 (L) 12/14/2023   HGB 8.9 (L) 12/14/2023   HCT 27.9 (L) 12/14/2023   MCV 80.6 12/14/2023   PLT 245 12/14/2023   Most recent BMP    Latest Ref Rng & Units 12/14/2023    4:34 AM  BMP  Glucose 70 - 99 mg/dL 80   BUN 6 - 20 mg/dL 8   Creatinine 1.30 - 8.65 mg/dL 7.84   Sodium 696 - 295 mmol/L 136   Potassium 3.5 - 5.1 mmol/L 3.8   Chloride 98 - 111 mmol/L 107   CO2 22 - 32 mmol/L 22   Calcium 8.9 - 10.3 mg/dL 8.6    Folate: 28.4 Phosphorus: 3.2 Mag museum: 1.9  Imaging/Diagnostic Tests: No new imaging Deborah Ruddle Makaelah Cranfield, [name] 12/14/2023, 7:40 AM  PGY-1, [facility] FPTS Intern pager: [phone], text pages welcome Secure chat group [facility] Teaching Service

## 2023-12-14 NOTE — Progress Notes (Signed)
Pt arrived to rm 27 from ED. Initiated tele. Oriented pt to the unit. vSS. Call bell within reach.   Lawson Radar, RN

## 2023-12-14 NOTE — Assessment & Plan Note (Addendum)
-   CIWA q2h with ativan prn  - d/c NS @ 75 ml/hr - MV, thiamine, and folic acid  - CCM - AM CBC, CMP, Mag, Phos - Regular diet  - TOC consult for substance use, following

## 2023-12-14 NOTE — Assessment & Plan Note (Signed)
History of tobacco use. Patient would like nicotine patch. - Nicotine patch

## 2023-12-15 DIAGNOSIS — D500 Iron deficiency anemia secondary to blood loss (chronic): Secondary | ICD-10-CM | POA: Diagnosis not present

## 2023-12-15 DIAGNOSIS — F419 Anxiety disorder, unspecified: Secondary | ICD-10-CM | POA: Insufficient documentation

## 2023-12-15 DIAGNOSIS — F10930 Alcohol use, unspecified with withdrawal, uncomplicated: Secondary | ICD-10-CM | POA: Diagnosis not present

## 2023-12-15 LAB — CBC
HCT: 26.6 % — ABNORMAL LOW (ref 36.0–46.0)
Hemoglobin: 8.6 g/dL — ABNORMAL LOW (ref 12.0–15.0)
MCH: 26 pg (ref 26.0–34.0)
MCHC: 32.3 g/dL (ref 30.0–36.0)
MCV: 80.4 fL (ref 80.0–100.0)
Platelets: 239 10*3/uL (ref 150–400)
RBC: 3.31 MIL/uL — ABNORMAL LOW (ref 3.87–5.11)
RDW: 19.9 % — ABNORMAL HIGH (ref 11.5–15.5)
WBC: 4.7 10*3/uL (ref 4.0–10.5)
nRBC: 0 % (ref 0.0–0.2)

## 2023-12-15 LAB — BASIC METABOLIC PANEL
Anion gap: 7 (ref 5–15)
BUN: 9 mg/dL (ref 6–20)
CO2: 23 mmol/L (ref 22–32)
Calcium: 8.6 mg/dL — ABNORMAL LOW (ref 8.9–10.3)
Chloride: 105 mmol/L (ref 98–111)
Creatinine, Ser: 0.78 mg/dL (ref 0.44–1.00)
GFR, Estimated: >60 mL/min (ref >60)
Glucose, Bld: 107 mg/dL — ABNORMAL HIGH (ref 70–99)
Potassium: 3.5 mmol/L (ref 3.5–5.1)
Sodium: 135 mmol/L (ref 135–145)

## 2023-12-15 MED ORDER — HYDROXYZINE HCL 10 MG PO TABS
10.0000 mg | ORAL_TABLET | Freq: Two times a day (BID) | ORAL | Status: DC | PRN
  Administered 2023-12-15 – 2023-12-16 (×3): 10 mg via ORAL
  Filled 2023-12-15 (×3): qty 1

## 2023-12-15 MED ORDER — PRENATAL MULTIVITAMIN CH
1.0000 | ORAL_TABLET | Freq: Every day | ORAL | Status: DC
  Administered 2023-12-15 – 2023-12-16 (×2): 1 via ORAL
  Filled 2023-12-15 (×4): qty 1

## 2023-12-15 MED ORDER — SERTRALINE HCL 50 MG PO TABS
25.0000 mg | ORAL_TABLET | Freq: Every day | ORAL | Status: DC
  Administered 2023-12-15 – 2023-12-18 (×4): 25 mg via ORAL
  Filled 2023-12-15 (×4): qty 1

## 2023-12-15 NOTE — Assessment & Plan Note (Signed)
Hg stable at 8.6 this AM. Iron studies indicate iron deficiency anemia, likely in the setting of heavy menstrual bleeding. - continue sulfate 325 mg every other day

## 2023-12-15 NOTE — Assessment & Plan Note (Signed)
-  Continue CIWA with every 4 hours, will discontinue as needed Ativan and asked nurse to contact in case of elevated CIWA score. Will likely be here for a few more days  given history of possible withdrawal seizures.  -Continue multivitamin, thiamine, folic acid - will need naltrexone on d/c for alcohol cravings

## 2023-12-15 NOTE — Plan of Care (Signed)

## 2023-12-15 NOTE — Assessment & Plan Note (Signed)
Patient does have anxiety at baseline, although not diagnosed. -Will start patient on sertraline 25 mg, will likely need to titrate to symptoms the patient

## 2023-12-15 NOTE — Progress Notes (Signed)
     Daily Progress Note Intern Pager: [phone]  Patient name: [name] Medical record number: [id number] Date of birth: 1986-10-17 Age: 37 y.o. Gender: female  Primary Care Provider: Pcp, No Consultants: None Code Status: Full  Pt Overview and Major Events to Date:  12/18: admitted   Assessment and Plan: Patient is a 37 year old female with past medical history of alcohol use disorder history of ?withdrawal seizures, tobacco use disorder who is admitted for alcohol detox.  Patient CIWA 3-10 overnight.  Patient is well-appearing this morning without acute distress.  Assessment & Plan Alcohol withdrawal (HCC) -Continue CIWA with every 4 hours, will discontinue as needed Ativan and asked nurse to contact in case of elevated CIWA score. Will likely be here for a few more days  given history of possible withdrawal seizures.  -Continue multivitamin, thiamine, folic acid - will need naltrexone on d/c for alcohol cravings Anemia Hg stable at 8.6 this AM. Iron studies indicate iron deficiency anemia, likely in the setting of heavy menstrual bleeding. - continue sulfate 325 mg every other day Anxiety Patient does have anxiety at baseline, although not diagnosed. -Will start patient on sertraline 25 mg, will likely need to titrate to symptoms the patient    Chronic and Stable Problems:  Tobacco Use: continue nicotine patch    FEN/GI: regular PPx: lovenox Dispo: home pending clinical improvement   Subjective:  Patient reports feeling a lot better this morning.  No acute distress  Objective: Temp:  [97.9 F (36.6 C)-99.2 F (37.3 C)] 98.9 F (37.2 C) (12/19 1113) Pulse Rate:  [71-102] 98 (12/19 1113) Resp:  [16-26] 19 (12/19 1113) BP: (89-112)/(51-81) 100/63 (12/19 1113) SpO2:  [100 %] 100 % (12/19 0758) Physical Exam: General: A&O, NAD HEENT: No sign of trauma Cardiac: RRR, no m/r/g Respiratory: CTAB, normal WOB GI: Soft, NTTP, non-distended  Extremities: NTTP,  no peripheral edema. Psych: Appropriate mood and affect   Laboratory: Most recent CBC Lab Results  Component Value Date   WBC 4.7 12/15/2023   HGB 8.6 (L) 12/15/2023   HCT 26.6 (L) 12/15/2023   MCV 80.4 12/15/2023   PLT 239 12/15/2023   Most recent BMP    Latest Ref Rng & Units 12/15/2023    3:03 AM  BMP  Glucose 70 - 99 mg/dL 595   BUN 6 - 20 mg/dL 9   Creatinine 6.38 - 7.56 mg/dL 4.33   Sodium 295 - 188 mmol/L 135   Potassium 3.5 - 5.1 mmol/L 3.5   Chloride 98 - 111 mmol/L 105   CO2 22 - 32 mmol/L 23   Calcium 8.9 - 10.3 mg/dL 8.6    Imaging/Diagnostic Tests: No new imaging  Penne Lash, MD 12/15/2023, 12:07 PM  PGY-1, [place] [facility] FPTS Intern pager: [phone], text pages welcome Secure chat group [facility] Teaching Service

## 2023-12-16 DIAGNOSIS — F10930 Alcohol use, unspecified with withdrawal, uncomplicated: Secondary | ICD-10-CM | POA: Diagnosis not present

## 2023-12-16 MED ORDER — ONDANSETRON 4 MG PO TBDP
4.0000 mg | ORAL_TABLET | Freq: Three times a day (TID) | ORAL | Status: DC | PRN
  Administered 2023-12-16: 4 mg via ORAL
  Filled 2023-12-16: qty 1

## 2023-12-16 MED ORDER — HYDROXYZINE HCL 10 MG PO TABS
10.0000 mg | ORAL_TABLET | Freq: Three times a day (TID) | ORAL | Status: DC | PRN
  Administered 2023-12-16 – 2023-12-18 (×5): 10 mg via ORAL
  Filled 2023-12-16 (×5): qty 1

## 2023-12-16 NOTE — Plan of Care (Signed)
  Problem: Education: Goal: Knowledge of General Education information will improve Description: Including pain rating scale, medication(s)/side effects and non-pharmacologic comfort measures 12/16/2023 2307 by Charmian Muff, RN Outcome: Progressing 12/16/2023 2207 by Charmian Muff, RN Outcome: Progressing   Problem: Health Behavior/Discharge Planning: Goal: Ability to manage health-related needs will improve 12/16/2023 2307 by Charmian Muff, RN Outcome: Progressing 12/16/2023 2207 by Charmian Muff, RN Outcome: Progressing   Problem: Clinical Measurements: Goal: Ability to maintain clinical measurements within normal limits will improve 12/16/2023 2307 by Charmian Muff, RN Outcome: Progressing 12/16/2023 2207 by Charmian Muff, RN Outcome: Progressing Goal: Will remain free from infection Outcome: Progressing   Problem: Skin Integrity: Goal: Risk for impaired skin integrity will decrease Outcome: Progressing   Problem: Education: Goal: Knowledge of disease or condition will improve Outcome: Progressing Goal: Understanding of discharge needs will improve Outcome: Progressing

## 2023-12-16 NOTE — Plan of Care (Signed)

## 2023-12-16 NOTE — Assessment & Plan Note (Signed)
Patient remained stable during detox.  Will likely discharge 12/22 after she is outside seizure window. -CIWA every 4 hours, nursing to contact provider in the setting of elevated CIWA score -Continue multivitamin, thiamine, folic acid -Naltrexone at discharge for alcohol cravings

## 2023-12-16 NOTE — Assessment & Plan Note (Deleted)
 Patient does have anxiety at baseline, although not diagnosed. -Will start patient on sertraline 25 mg, will likely need to titrate to symptoms the patient

## 2023-12-16 NOTE — Progress Notes (Signed)
Patient's heart rate dropped to 37-40, had missed beat non sustained. When went to check on the patient , she was sleeping. Denied any discomfort. BP 110/70. As patient woke up HR was 80-90's. Pt hasn't been eating and drinking much, sleeping. Instructed patient to stay hydrated, juice and apple sauce given. Patient voiced understanding. Significant other on bedside. Plan of care continues.

## 2023-12-16 NOTE — Plan of Care (Signed)
  Problem: Education: Goal: Knowledge of General Education information will improve Description: Including pain rating scale, medication(s)/side effects and non-pharmacologic comfort measures Outcome: Progressing   Problem: Health Behavior/Discharge Planning: Goal: Ability to manage health-related needs will improve Outcome: Progressing   Problem: Clinical Measurements: Goal: Ability to maintain clinical measurements within normal limits will improve Outcome: Progressing   Problem: Education: Goal: Knowledge of disease or condition will improve Outcome: Progressing Goal: Understanding of discharge needs will improve Outcome: Progressing

## 2023-12-16 NOTE — Plan of Care (Signed)

## 2023-12-16 NOTE — Assessment & Plan Note (Deleted)
 History of tobacco use. Patient would like nicotine patch. - Nicotine patch

## 2023-12-16 NOTE — Progress Notes (Signed)
     Daily Progress Note Intern Pager: [phone]  Patient name: [name] Medical record number: [id number] Date of birth: [birth year] Age: 37 y.o. Gender: female  Primary Care Provider: Pcp, No Consultants: None Code Status: Full  Pt Overview and Major Events to Date:  12/18: Admitted  Assessment and Plan: [name] is a 37 year old female with pertinent past medical history of alcohol use disorder with history of withdrawal seizures and tobacco use disorder admitted in the setting of voluntary alcohol detox.  CIWA was less than 10 overnight.  Assessment & Plan Alcohol withdrawal (HCC) Patient remained stable during detox.  Will likely discharge 12/22 after she is outside seizure window. -CIWA every 4 hours, nursing to contact provider in the setting of elevated CIWA score -Continue multivitamin, thiamine, folic acid -Naltrexone at discharge for alcohol cravings   Chronic and Stable Problems:  Iron deficiency anemia: Found to have iron deficiency anemia during this admission, likely secondary to heavy menstrual bleeding.  Continue oral iron supplementation while admitted.  May consider IV iron outpatient. Tobacco use: Continue nicotine patch Anxiety: Continue sertraline 25 mg, continue Atarax 10 mg twice daily as needed   FEN/GI: regular  PPx: lovenox  Dispo:home pending clinical improvement   Subjective:  Patient reports she is feeling much better this morning. She reports no acute distress.   Objective: Temp:  [97.9 F (36.6 C)-99.1 F (37.3 C)] 99 F (37.2 C) (12/20 0740) Pulse Rate:  [78-99] 89 (12/20 0740) Resp:  [16-19] 16 (12/20 0740) BP: (92-115)/(51-76) 92/76 (12/20 0740) SpO2:  [100 %] 100 % (12/20 0740) Physical Exam: General: A&O, NAD HEENT: No sign of trauma, NWOB on RA  Cardiac: RRR, no m/r/g Respiratory: CTAB, normal WOB GI: Soft, NTND Extremities: no peripheral edema  Neuro: moves all extremities appropriately  Psych: Appropriate mood and  affect   Laboratory: Most recent CBC Lab Results  Component Value Date   WBC 4.7 12/15/2023   HGB 8.6 (L) 12/15/2023   HCT 26.6 (L) 12/15/2023   MCV 80.4 12/15/2023   PLT 239 12/15/2023   Most recent BMP    Latest Ref Rng & Units 12/15/2023    3:03 AM  BMP  Glucose 70 - 99 mg/dL 315   BUN 6 - 20 mg/dL 9   Creatinine 1.76 - 1.60 mg/dL 7.37   Sodium 106 - 269 mmol/L 135   Potassium 3.5 - 5.1 mmol/L 3.5   Chloride 98 - 111 mmol/L 105   CO2 22 - 32 mmol/L 23   Calcium 8.9 - 10.3 mg/dL 8.6    Imaging/Diagnostic Tests:   Penne Lash, MD 12/16/2023, 7:51 AM  PGY-1, [facility] FPTS Intern pager: [phone], text pages welcome Secure chat group [facility] Teaching Service

## 2023-12-16 NOTE — Assessment & Plan Note (Deleted)
 Hg stable at 8.6 this AM. Iron studies indicate iron deficiency anemia, likely in the setting of heavy menstrual bleeding. - continue sulfate 325 mg every other day

## 2023-12-17 ENCOUNTER — Other Ambulatory Visit (HOSPITAL_COMMUNITY): Payer: Self-pay

## 2023-12-17 ENCOUNTER — Encounter (HOSPITAL_COMMUNITY): Payer: Self-pay | Admitting: Family Medicine

## 2023-12-17 DIAGNOSIS — F10930 Alcohol use, unspecified with withdrawal, uncomplicated: Secondary | ICD-10-CM | POA: Diagnosis not present

## 2023-12-17 MED ORDER — SERTRALINE HCL 25 MG PO TABS
25.0000 mg | ORAL_TABLET | Freq: Every day | ORAL | 0 refills | Status: DC
  Filled 2023-12-17: qty 30, 30d supply, fill #0

## 2023-12-17 MED ORDER — FERROUS SULFATE 325 (65 FE) MG PO TABS
325.0000 mg | ORAL_TABLET | ORAL | 0 refills | Status: DC
  Filled 2023-12-17: qty 15, 30d supply, fill #0

## 2023-12-17 MED ORDER — ONDANSETRON 4 MG PO TBDP
4.0000 mg | ORAL_TABLET | Freq: Three times a day (TID) | ORAL | 0 refills | Status: DC | PRN
  Filled 2023-12-17: qty 20, 20d supply, fill #0

## 2023-12-17 MED ORDER — PRENATAL MULTIVITAMIN CH
1.0000 | ORAL_TABLET | Freq: Every day | ORAL | 0 refills | Status: DC
  Filled 2023-12-17: qty 30, 30d supply, fill #0

## 2023-12-17 MED ORDER — NALTREXONE HCL 50 MG PO TABS
50.0000 mg | ORAL_TABLET | Freq: Every day | ORAL | 0 refills | Status: DC
  Filled 2023-12-17: qty 30, 30d supply, fill #0

## 2023-12-17 MED ORDER — HYDROXYZINE HCL 10 MG PO TABS
10.0000 mg | ORAL_TABLET | Freq: Three times a day (TID) | ORAL | 0 refills | Status: DC | PRN
  Filled 2023-12-17: qty 30, 10d supply, fill #0

## 2023-12-17 NOTE — Plan of Care (Signed)
Plan of care is reviewed. Pt is progressing and adequate for discharge tomorrow. She is independently ambulate in her room, CIWA score is 0-1 with mild headache. Tylenol given. She is able to rest well. Stable hemodynamically, afebrile, no distress noted. Problem: Clinical Measurements: Goal: Ability to maintain clinical measurements within normal limits will improve Outcome: Progressing Goal: Will remain free from infection Outcome: Progressing Goal: Diagnostic test results will improve Outcome: Progressing Goal: Respiratory complications will improve Outcome: Progressing Goal: Cardiovascular complication will be avoided Outcome: Progressing   Problem: Health Behavior/Discharge Planning: Goal: Ability to manage health-related needs will improve Outcome: Progressing   Problem: Education: Goal: Knowledge of General Education information will improve Description: Including pain rating scale, medication(s)/side effects and non-pharmacologic comfort measures Outcome: Progressing   Problem: Activity: Goal: Risk for activity intolerance will decrease Outcome: Progressing   Problem: Coping: Goal: Level of anxiety will decrease Outcome: Progressing   Problem: Nutrition: Goal: Adequate nutrition will be maintained Outcome: Progressing   Filiberto Pinks, RN

## 2023-12-17 NOTE — Assessment & Plan Note (Signed)
Patient does have anxiety at baseline, although not diagnosed. -Will start patient on sertraline 25 mg, will likely need to titrate to symptoms the patient -Continue hydroxyzine 10 mg BID PRN

## 2023-12-17 NOTE — Assessment & Plan Note (Addendum)
IDA based on studies this admission, likely in the setting of heavy menstrual bleeding.  Consider IV iron and/or TXA outpatient. -Continue ferrous sulfate 325 mg every other day

## 2023-12-17 NOTE — Assessment & Plan Note (Addendum)
Patient remains stable.  Will likely discharge 12/22 after she is outside seizure window. -CIWAs Q4h, RN to notify if scores elevated -Continue multivitamin, thiamine, folic acid -Naltrexone at discharge for alcohol cravings

## 2023-12-17 NOTE — Progress Notes (Signed)
Daily Progress Note Intern Pager: [phone]  Patient name: [name] Medical record number: [id number] Date of birth: 02-27-1986 Age: 37 y.o. Gender: female  Primary Care Provider: Pcp, No Consultants: None Code Status: Full   Pt Overview and Major Events to Date:  12/18: Admitted, on CIWAs w/ PRN lorazepam 12/19: Lorazepam discontinued   Assessment and Plan: HENRINE SIM is a 37 y.o. female with a pertinent PMH of alcohol use disorder with past withdrawal seizures who presented with acute withdrawal and was admitted for CIWA monitoring and medical support.  CIWAs not documented overnight, but patient appears well and my CIWA score is 3 on evaluation this AM.  If patient remains stable the next 24 hours, can discharge tomorrow once outside seizure window. Assessment & Plan Alcohol withdrawal (HCC) Patient remains stable.  Will likely discharge 12/22 after she is outside seizure window. -CIWAs Q4h, RN to notify if scores elevated -Continue multivitamin, thiamine, folic acid -Naltrexone at discharge for alcohol cravings Anemia IDA based on studies this admission, likely in the setting of heavy menstrual bleeding.  Consider IV iron and/or TXA outpatient. -Continue ferrous sulfate 325 mg every other day Anxiety Patient does have anxiety at baseline, although not diagnosed. -Will start patient on sertraline 25 mg, will likely need to titrate to symptoms the patient -Continue hydroxyzine 10 mg BID PRN  Chronic and Stable Problems: Tobacco use: Continue nicotine patch  FEN/GI: Regular diet PPx: Enoxaparin Dispo: Home once outside high risk withdrawal seizure window, likely 12/22.  Subjective:  Patient reports significant nausea last night that improved with Zofran. States that menstrual bleeding has improved this AM. Is prepared to go home tomorrow if medically stable and will attempt to maintain abstinence from alcohol on naltrexone. Reports brief lateral chest  pain near R should overnight that has since resolved.  R shoulder is still sore.  Patient has not asked for acetaminophen PRN for this pain. Reports some night sweats for the past few nights.  Objective: Temp:  [98.4 F (36.9 C)-99 F (37.2 C)] 98.8 F (37.1 C) (12/20 2346) Pulse Rate:  [69-89] 69 (12/20 2346) Resp:  [16-21] 19 (12/20 2346) BP: (92-113)/(62-76) 97/62 (12/20 2346) SpO2:  [91 %-100 %] 100 % (12/20 2346)  Physical Exam: General: Age-appropriate, resting comfortably in bed, NAD, alert and at baseline. Cardiovascular: Regular rate and rhythm. Normal S1/S2. No murmurs, rubs, or gallops appreciated. 2+ radial pulses. Abdominal: Normoactive bowel sounds, nondistended. No tenderness to deep or light palpation. No rebound or guarding. No HSM. Extremities: Capillary refill <2 seconds. Neuro: No tremors with outstretched arms.  Alert and oriented x4. Psych: Anxious mood, full range affect.  Normal attention and concentration.  Judgement normal.  Linear thought process.  Denies SI/HI.  Laboratory: Most recent CBC Lab Results  Component Value Date   WBC 4.7 12/15/2023   HGB 8.6 (L) 12/15/2023   HCT 26.6 (L) 12/15/2023   MCV 80.4 12/15/2023   PLT 239 12/15/2023   Most recent BMP    Latest Ref Rng & Units 12/15/2023    3:03 AM  BMP  Glucose 70 - 99 mg/dL 130   BUN 6 - 20 mg/dL 9   Creatinine 8.65 - 7.84 mg/dL 6.96   Sodium 295 - 284 mmol/L 135   Potassium 3.5 - 5.1 mmol/L 3.5   Chloride 98 - 111 mmol/L 105   CO2 22 - 32 mmol/L 23   Calcium 8.9 - 10.3 mg/dL 8.6     Other pertinent labs: -None  New Imaging/Diagnostic Tests: -None  Aubery Douthat, MD 12/17/2023, 3:33 AM  PGY-1, [facility] Family Medicine FPTS Intern pager: [phone], text pages welcome Secure chat group [facility] Teaching Service

## 2023-12-18 ENCOUNTER — Other Ambulatory Visit (HOSPITAL_COMMUNITY): Payer: Self-pay

## 2023-12-18 DIAGNOSIS — F10930 Alcohol use, unspecified with withdrawal, uncomplicated: Secondary | ICD-10-CM | POA: Diagnosis not present

## 2023-12-18 DIAGNOSIS — D500 Iron deficiency anemia secondary to blood loss (chronic): Secondary | ICD-10-CM | POA: Diagnosis not present

## 2023-12-18 DIAGNOSIS — F419 Anxiety disorder, unspecified: Secondary | ICD-10-CM | POA: Diagnosis not present

## 2023-12-18 NOTE — Discharge Summary (Addendum)
Family Medicine Teaching [facility] Discharge Summary  Patient name: [name] Medical record number: [id number] Date of birth: 03-Oct-1986 Age: 37 y.o. Gender: female Date of Admission: 12/13/2023  Date of Discharge: 12/18/2023 Admitting Physician: Penne Lash, MD  Primary Care Provider: Pcp, No Consultants: None  Indication for Hospitalization: Alcohol withdrawal with history of withdrawal seizures   Discharge Diagnoses/Problem List:  Principal Problem for Admission: Alcohol withdrawal  Other Problems addressed during stay:  Principal Problem:   Alcohol withdrawal (HCC) Active Problems:   Anemia iron deficiency due to menses   Anxiety Desires pregnancy Tobacco use disorder Alcohol use disorder      Brief Hospital Course:  [name] is a 37 y.o.female with a history of alcohol use disorder who was admitted to the [facility] medicine Teaching Service at [facility] for alcohol withdrawal. Her hospital course is detailed below:  Alcohol withdrawal Patient presented to [facility] for voluntary alcohol withdrawal.  Reports last drink was 12/16 in the morning.  She was started on CIWA protocol with Ativan taper.  On the morning of 12/17, staff noted CIWA of 18 with somnolence.  Patient was given 2 mg of Ativan with mild improvement with CIWA of 12. [facility] staff transferred her to the [facility] due to ?history of etOH withdrawal seizure.  In the ED provider gave her 1 dose of oral phenobarbital and consulted service for admission.  Patient was admitted with CIWA protocol and as needed Ativan.  Patient CIWAs ranged from 3-11 which was treated with ativan as needed. Patient improved appropriately. Patient was discharged with naltrexone for alcohol cravings.  Anxiety: Patient was started on sertraline and atarax for anxiety. Patient endorses improvement in anxiety prior to discharge.   Anemia Patient had some anemia during admission, with Hg dropping to 8.9. Patient does have history  of IDA from heavy menstraul cycles. Patient remained asymptomatic. Offered contraception options which she declined. Started on iron supplementation. Consider IV iron transfusion outpatient     Other chronic conditions were medically managed with home medications and formulary alternatives as necessary (None)  PCP Follow-up Recommendations: Repeat CBC in 6-8 weeks, if low consider IV iron infusion outpatient. Would consider hormonal contraception options which could help manage these heavy menses.  Patient on new psych medication, follow up side effects and efficacy and titrate as needed   Follow up alcohol use disorder and use of naltrexone Follow up nicotine cessation  Patient endorsed left shoulder pain with an exam consistent with rotator cuff arthropathy. Consider outpatient imaging if this persists.   Naltrexone will need to be discontinued should the patient become pregnant    Disposition: Home  Discharge Condition: Stable  Discharge Exam:  Vitals:   12/18/23 0434 12/18/23 0821  BP: 103/64 121/74  Pulse: 71 80  Resp: 20   Temp: 98.5 F (36.9 C) 98.7 F (37.1 C)  SpO2: 100% 100%   General: A&O, NAD HEENT: No sign of trauma, EOM grossly intact Cardiac: RRR, no m/r/g Respiratory: CTAB, normal WOB, no w/c/r GI: Soft, NTTP, non-distended  Extremities: no peripheral edema  Neuro: Normal gait, moves all four extremities appropriately. Psych: Appropriate mood and affect   Significant Procedures: None  Significant Labs and Imaging:     Latest Ref Rng & Units 12/15/2023    3:03 AM 12/14/2023    2:36 PM 12/14/2023    4:34 AM  CBC  WBC 4.0 - 10.5 K/uL 4.7  5.1  3.5   Hemoglobin 12.0 - 15.0 g/dL 8.6  8.8  8.9   Hematocrit 36.0 - 46.0 % 26.6  27.3  27.9   Platelets 150 - 400 K/uL 239  238  245       Latest Ref Rng & Units 12/15/2023    3:03 AM 12/14/2023    4:34 AM 12/13/2023    7:58 PM  CMP  Glucose 70 - 99 mg/dL 536  80  644   BUN 6 - 20 mg/dL 9  8  8     Creatinine 0.44 - 1.00 mg/dL 0.34  7.42  5.95   Sodium 135 - 145 mmol/L 135  136  136   Potassium 3.5 - 5.1 mmol/L 3.5  3.8  3.5   Chloride 98 - 111 mmol/L 105  107  104   CO2 22 - 32 mmol/L 23  22  23    Calcium 8.9 - 10.3 mg/dL 8.6  8.6  9.1   Total Protein 6.5 - 8.1 g/dL  6.3    Total Bilirubin <1.2 mg/dL  0.6    Alkaline Phos 38 - 126 U/L  70    AST 15 - 41 U/L  33    ALT 0 - 44 U/L  24       Component Ref Range & Units (hover) 4 d ago   Iron 119   TIBC 577 High    Saturation Ratios 21   UIBC 458    Pertinent Imaging    Results/Tests Pending at Time of Discharge: None  Discharge Medications:  Allergies as of 12/18/2023   No Known Allergies      Medication List     TAKE these medications    FeroSul 325 (65 Fe) MG tablet Generic drug: ferrous sulfate Take 1 tablet (325 mg total) by mouth every other day.   hydrOXYzine 10 MG tablet Commonly known as: ATARAX Take 1 tablet (10 mg total) by mouth 3 (three) times daily as needed for anxiety.   multivitamin with minerals Tabs tablet Take 1 tablet by mouth daily.   naltrexone 50 MG tablet Commonly known as: DEPADE Take 1 tablet (50 mg total) by mouth daily.   ondansetron 4 MG disintegrating tablet Commonly known as: ZOFRAN-ODT Take 1 tablet (4 mg total) by mouth every 8 (eight) hours as needed for up to 20 doses for nausea or vomiting.   Prenatal 27-1 MG Tabs Take 1 tablet by mouth daily at 12 noon.   sertraline 25 MG tablet Commonly known as: ZOLOFT Take 1 tablet (25 mg total) by mouth daily.        Discharge Instructions: Please refer to Patient Instructions section of EMR for full details.  Patient was counseled important signs and symptoms that should prompt return to medical care, changes in medications, dietary instructions, activity restrictions, and follow up appointments.   Follow-Up Appointments: Future Appointments  Date Time Provider Department Center  12/30/2023  9:25 AM Penne Lash,  MD [facility]    Follow-up Information     Baloch, Mahnoor, MD. Go in 12 day(s).   Specialty: Family Medicine Why: 12/30/2023  9:25 AM Penne Lash, MD Contact information: [address] [place] [address] [phone]                 Penne Lash, MD 12/18/2023, 10:16 AM PGY-1, [facility] Family Medicine     I have evaluated this patient along with Dr. Georg Ruddle and reviewed the above note, making necessary revisions.  Dorothyann Gibbs, MD 12/18/2023, 1:51 PM PGY-3, [facility] Family Medicine

## 2023-12-22 ENCOUNTER — Ambulatory Visit: Payer: Self-pay

## 2023-12-30 ENCOUNTER — Inpatient Hospital Stay: Payer: Self-pay | Admitting: Family Medicine

## 2024-01-04 ENCOUNTER — Other Ambulatory Visit (HOSPITAL_COMMUNITY): Payer: Self-pay

## 2024-01-04 ENCOUNTER — Other Ambulatory Visit: Payer: Self-pay

## 2024-01-04 DIAGNOSIS — D500 Iron deficiency anemia secondary to blood loss (chronic): Secondary | ICD-10-CM

## 2024-01-04 DIAGNOSIS — F419 Anxiety disorder, unspecified: Secondary | ICD-10-CM

## 2024-01-04 MED ORDER — HYDROXYZINE HCL 10 MG PO TABS
10.0000 mg | ORAL_TABLET | Freq: Three times a day (TID) | ORAL | 0 refills | Status: DC | PRN
  Filled 2024-01-04: qty 15, 5d supply, fill #0

## 2024-01-04 MED ORDER — FERROUS SULFATE 325 (65 FE) MG PO TABS
325.0000 mg | ORAL_TABLET | ORAL | 0 refills | Status: DC
  Filled 2024-01-04: qty 15, 30d supply, fill #0

## 2024-01-04 NOTE — Telephone Encounter (Signed)
 Patient calls nurse line requesting refills on ferrous sulfate  and hydroxyzine .   She has a new patient appointment on Monday, 01/09/2024 and is asking if she can receive refills to last until then.   She received medications while admitted under Family Medicine Service at the hospital.   Will forward to Dr. Lonnie (new PCP) for further advisement.   Chiquita JAYSON English, RN

## 2024-01-09 ENCOUNTER — Ambulatory Visit (INDEPENDENT_AMBULATORY_CARE_PROVIDER_SITE_OTHER): Payer: No Typology Code available for payment source | Admitting: Family Medicine

## 2024-01-09 ENCOUNTER — Encounter: Payer: Self-pay | Admitting: Family Medicine

## 2024-01-09 ENCOUNTER — Other Ambulatory Visit (HOSPITAL_COMMUNITY): Payer: Self-pay

## 2024-01-09 VITALS — BP 115/66 | HR 63 | Ht 62.0 in | Wt 148.5 lb

## 2024-01-09 DIAGNOSIS — M25512 Pain in left shoulder: Secondary | ICD-10-CM | POA: Insufficient documentation

## 2024-01-09 DIAGNOSIS — F419 Anxiety disorder, unspecified: Secondary | ICD-10-CM

## 2024-01-09 DIAGNOSIS — D500 Iron deficiency anemia secondary to blood loss (chronic): Secondary | ICD-10-CM | POA: Diagnosis not present

## 2024-01-09 DIAGNOSIS — F1029 Alcohol dependence with unspecified alcohol-induced disorder: Secondary | ICD-10-CM

## 2024-01-09 MED ORDER — SERTRALINE HCL 50 MG PO TABS
50.0000 mg | ORAL_TABLET | Freq: Every day | ORAL | 1 refills | Status: DC
  Filled 2024-01-09: qty 30, 30d supply, fill #0

## 2024-01-09 MED ORDER — FERROUS SULFATE 325 (65 FE) MG PO TABS
325.0000 mg | ORAL_TABLET | ORAL | 1 refills | Status: DC
  Filled 2024-01-09: qty 15, 30d supply, fill #0

## 2024-01-09 MED ORDER — SERTRALINE HCL 25 MG PO TABS
25.0000 mg | ORAL_TABLET | Freq: Every day | ORAL | 1 refills | Status: DC
  Filled 2024-01-09: qty 30, 30d supply, fill #0

## 2024-01-09 MED ORDER — NALTREXONE HCL 50 MG PO TABS
50.0000 mg | ORAL_TABLET | Freq: Every day | ORAL | 2 refills | Status: DC
  Filled 2024-01-09: qty 30, 30d supply, fill #0

## 2024-01-09 MED ORDER — HYDROXYZINE HCL 10 MG PO TABS
10.0000 mg | ORAL_TABLET | Freq: Three times a day (TID) | ORAL | 1 refills | Status: DC | PRN
  Filled 2024-01-09: qty 30, 10d supply, fill #0

## 2024-01-09 NOTE — Assessment & Plan Note (Signed)
 Likely 2/2 to heavy menstrual cycles.  - CBC today - continue iron supplementation - ferritin at next visit

## 2024-01-09 NOTE — Assessment & Plan Note (Signed)
 Acute left shoulder pain after almost fall while in hospital. ROM limited by pain. Concern for partial rotator cuff tear.  - referral to sports medicine

## 2024-01-09 NOTE — Assessment & Plan Note (Addendum)
 GAD-7 today was 10. Patient is doing better since leaving hospital, but does feel like her anxiety is more prominent since she is no longer coping with alcohol . Believes zoloft  and atarax  are helpful. Interested in outpatient therapy. - increase sertraline  to 50mg , consider titration up in 1 month - continue atarax  prn - outpatient therapy resources provided

## 2024-01-09 NOTE — Patient Instructions (Addendum)
 It was great to see you today! Thank you for choosing [facility] for your primary care. Meriel ONEIDA Lesches was seen for hospital follow up.  Today we addressed: You're doing a great job with your recovery!! Keep up the good work. I will send a refill for your medication to our pharmacy. I have attached some resources for you to seek therapy/outpatient rehab below. Your anxiety- I will increase your medication today. I want to see you back in one month to see how this dose is doing for you. Continue using the atarax  as needed.  Your shoulder- I think it would be best to set you up to see sports medicine. They can evaluate your shoulder pain and work towards physical therapy if needed.  Your medication interacts with pregnancy, please ensure you stop taking atarax  and naltrexone  if you are to become pregnant.   If you haven't already, sign up for My Chart to have easy access to your labs results, and communication with your primary care physician.  We are checking some labs today. If they are abnormal, I will call you. If they are normal, I will send you a MyChart message (if it is active) or a letter in the mail. If you do not hear about your labs in the next 2 weeks, please call the office.   You should return to our clinic Return in about 1 month (around 02/09/2024) for follow up and healthcare maintainence .  I recommend that you always bring your medications to each appointment as this makes it easy to ensure you are on the correct medications and helps us  not miss refills when you need them.  Please arrive 15 minutes before your appointment to ensure smooth check in process.  We appreciate your efforts in making this happen.  Please call the clinic at [phone] if your symptoms worsen or you have any concerns.  Thank you for allowing me to participate in your care, Gloriann Ogren, MD 01/09/2024, 2:09 PM PGY-1, [facility] Family Medicine For information on therapists, please go to  [email or web address]. You can also contact your insurance company to find an in-network therapist.   Therapy and Counseling Resources Most providers on this list will take Medicaid. Patients with commercial insurance or Medicare should contact their insurance company to get a list of in network providers.  [facility] (takes children) Location 1: [address] [place], [place] [address] Location 2: [address] [place], [place] [address] [phone]   [facility] (spanish speaking therapist available)(habla espanol)(take medicare and medicaid)  [address], [place], [place] [address], [place]  [email or web address] [phone]  [facility] Counseling [address] [place], [place] [address] [phone]  [facility]   [address], [place], [place] [address]      [phone]  [facility] (spanish available) [address]  [place], [place] [address] [phone]  [facility] (take [facility] [place] and medicare) [address], [address]  [place], [place] [address]       [phone]     [facility] (virtual only) [phone]  [facility] [address] [place], [place] [phone]  [facility]:  [address] [place] [phone]  [facility]:  [address] [place] [phone]  [facility] (under & uninsured) [address]   [place] [phone]    [email or web address]    [facility] [address]  [place]    [phone]  [facility] -[address]     Phone:  [phone]     [facility]-  [address]  [phone]   [facility] [address]      [place], [place] [phone]  [facility]: [address] [place], [place], [place]  [phone]    [facility] (Spanish therapist) [email or web address]  [address] [place]  [address]   [place] [address]    [phone]    [facility]   [address],  [place], [place]  [phone]   [facility]  [address] [place]  [phone]  [facility]  [address] [place], [place]        [phone]  Open Access/Walk In Clinic under & uninsured  [facility]  [address] [place], [place] [phone] Crisis [phone]  [facility] the [address],  (Spanish)   [address] , [place]: [phone]) 8:30 - 12; 1 - 2:30  [facility] of the [employer],  [address] [place], [place]    [phone]):8:30 - 12; 2 - 3PM  [facility] [employer],  [address]  [place]; [phone]):   Mon - Fri 8 AM - 5 PM  [facility]  [facility] [address] [place]   MWF 12:30 to 3:00 or call to schedule an appointment  [phone]  Specific Provider options Psychology Today  [email or web address]/us  click on find a therapist  enter your zip code left side and select or tailor a therapist for your specific need.   [facility] Provider Directory [email or web address]  (Medicaid)   Follow all drop down to find a provider  Social Support program [facility] [place] [phone] or photosolver.pl [address], [place], [place] Recovery support and educational   24- Hour Availability:   [facility]  [address] [place], [place] [phone] Crisis [phone]  Family Service of the [facility] [phone]  [facility]  [phone]   [facility]  [phone] (after hours)  Therapeutic Alternative/Mobile Crisis   [phone]  [place]  National Suicide Hotline  631-281-9487 MERRILYN)  Call 911 or go  to emergency room  [facility]  [phone]);  [place]  [phone]); [place], [place], [place], [place], [place], [place], [place]

## 2024-01-09 NOTE — Progress Notes (Signed)
    SUBJECTIVE:   CHIEF COMPLAINT / HPI:   Patient presents for hospital follow up for alcohol  detoxification. Patient reports since discharge she is doing much better. She has not had a drink. Is feeling more anxious and having some trouble sleeping but believes this is because stopping the alcohol  has unmasked her anxiety. She is also smoking more. She reports overall she feels much better and feels medication is helping her with cravings.    PERTINENT  PMH / PSH:  Alcohol  dependence with history of alcohol  withdrawal Anxiety    OBJECTIVE:   BP 115/66   Pulse 63   Ht 5' 2 (1.575 m)   Wt 148 lb 8 oz (67.4 kg)   LMP 01/07/2024   SpO2 100%   BMI 27.16 kg/m   General: A&O, NAD HEENT: No sign of trauma, EOM grossly intact Cardiac: RRR, no m/r/g Respiratory: CTAB, normal WOB GI: Soft, NTND MSK: full ROM right shoulder, pain with abduction, no pain with internal/external rotation, negative empty can test  Neuro: Normal gait Psych: Appropriate mood and affect   ASSESSMENT/PLAN:   Alcohol  dependence (HCC) Patient doing well with naltrexone . Has not had a drink since leaving the hospital. Will continue current naltrexone  dosing and follow up in 1 month. Resources provided for outpatient therapy.  - repeat CBC/CMP  - continue naltrexone  50 mg daily  - outpatient resources provided - consider follow up with Dr. Koval if patient requires further management of alcohol  withdrawal.   Anxiety GAD-7 today was 10. Patient is doing better since leaving hospital, but does feel like her anxiety is more prominent since she is no longer coping with alcohol . Believes zoloft  and atarax  are helpful. Interested in outpatient therapy. - increase sertraline  to 50mg , consider titration up in 1 month - continue atarax  prn - outpatient therapy resources provided   Left shoulder pain Acute left shoulder pain after almost fall while in hospital. ROM limited by pain. Concern for partial rotator cuff  tear.  - referral to sports medicine  Anemia Likely 2/2 to heavy menstrual cycles.  - CBC today - continue iron  supplementation - ferritin at next visit   Follow up in 1 month. Consider healthcare maintenance at next visit.   Gloriann Ogren, MD [facility]

## 2024-01-09 NOTE — Assessment & Plan Note (Addendum)
 Patient doing well with naltrexone . Has not had a drink since leaving the hospital. Will continue current naltrexone  dosing and follow up in 1 month. Resources provided for outpatient therapy.  - repeat CBC/CMP  - continue naltrexone  50 mg daily  - outpatient resources provided - consider follow up with Dr. Koval if patient requires further management of alcohol  withdrawal.

## 2024-01-10 ENCOUNTER — Encounter: Payer: Self-pay | Admitting: Family Medicine

## 2024-01-10 LAB — COMPREHENSIVE METABOLIC PANEL
ALT: 16 [IU]/L (ref 0–32)
AST: 27 [IU]/L (ref 0–40)
Albumin: 4.6 g/dL (ref 3.9–4.9)
Alkaline Phosphatase: 110 [IU]/L (ref 44–121)
BUN/Creatinine Ratio: 16 (ref 9–23)
BUN: 11 mg/dL (ref 6–20)
Bilirubin Total: <0.2 mg/dL (ref 0.0–1.2)
CO2: 18 mmol/L — ABNORMAL LOW (ref 20–29)
Calcium: 9.8 mg/dL (ref 8.7–10.2)
Chloride: 103 mmol/L (ref 96–106)
Creatinine, Ser: 0.7 mg/dL (ref 0.57–1.00)
Globulin, Total: 2.9 g/dL (ref 1.5–4.5)
Glucose: 86 mg/dL (ref 70–99)
Potassium: 4.3 mmol/L (ref 3.5–5.2)
Sodium: 142 mmol/L (ref 134–144)
Total Protein: 7.5 g/dL (ref 6.0–8.5)
eGFR: 115 mL/min/{1.73_m2} (ref >59)

## 2024-01-10 LAB — CBC
Hematocrit: 35.5 % (ref 34.0–46.6)
Hemoglobin: 11.3 g/dL (ref 11.1–15.9)
MCH: 27.6 pg (ref 26.6–33.0)
MCHC: 31.8 g/dL (ref 31.5–35.7)
MCV: 87 fL (ref 79–97)
Platelets: 345 10*3/uL (ref 150–450)
RBC: 4.09 x10E6/uL (ref 3.77–5.28)
RDW: 21.5 % — ABNORMAL HIGH (ref 11.7–15.4)
WBC: 5.1 10*3/uL (ref 3.4–10.8)

## 2024-01-12 ENCOUNTER — Ambulatory Visit: Payer: No Typology Code available for payment source | Admitting: Family Medicine

## 2024-02-10 ENCOUNTER — Ambulatory Visit: Payer: No Typology Code available for payment source | Admitting: Family Medicine

## 2024-03-19 ENCOUNTER — Other Ambulatory Visit: Payer: Self-pay

## 2024-03-19 ENCOUNTER — Telehealth: Payer: Self-pay

## 2024-03-19 ENCOUNTER — Emergency Department (HOSPITAL_COMMUNITY)
Admission: EM | Admit: 2024-03-19 | Discharge: 2024-03-19 | Disposition: A | Discharge status: Home / Self Care | Attending: Emergency Medicine | Admitting: Emergency Medicine

## 2024-03-19 ENCOUNTER — Encounter (HOSPITAL_COMMUNITY): Payer: Self-pay

## 2024-03-19 DIAGNOSIS — E876 Hypokalemia: Secondary | ICD-10-CM | POA: Diagnosis not present

## 2024-03-19 DIAGNOSIS — R531 Weakness: Secondary | ICD-10-CM | POA: Diagnosis present

## 2024-03-19 DIAGNOSIS — F10129 Alcohol abuse with intoxication, unspecified: Secondary | ICD-10-CM | POA: Insufficient documentation

## 2024-03-19 DIAGNOSIS — R1013 Epigastric pain: Secondary | ICD-10-CM | POA: Diagnosis not present

## 2024-03-19 DIAGNOSIS — D72829 Elevated white blood cell count, unspecified: Secondary | ICD-10-CM | POA: Insufficient documentation

## 2024-03-19 DIAGNOSIS — R001 Bradycardia, unspecified: Secondary | ICD-10-CM | POA: Insufficient documentation

## 2024-03-19 DIAGNOSIS — E861 Hypovolemia: Secondary | ICD-10-CM | POA: Diagnosis not present

## 2024-03-19 DIAGNOSIS — E8729 Other acidosis: Secondary | ICD-10-CM

## 2024-03-19 DIAGNOSIS — F17210 Nicotine dependence, cigarettes, uncomplicated: Secondary | ICD-10-CM | POA: Insufficient documentation

## 2024-03-19 LAB — CBC
HCT: 39.4 % (ref 36.0–46.0)
Hemoglobin: 13.2 g/dL (ref 12.0–15.0)
MCH: 29.1 pg (ref 26.0–34.0)
MCHC: 33.5 g/dL (ref 30.0–36.0)
MCV: 86.8 fL (ref 80.0–100.0)
Platelets: 404 10*3/uL — ABNORMAL HIGH (ref 150–400)
RBC: 4.54 MIL/uL (ref 3.87–5.11)
RDW: 15.3 % (ref 11.5–15.5)
WBC: 12.2 10*3/uL — ABNORMAL HIGH (ref 4.0–10.5)
nRBC: 0 % (ref 0.0–0.2)

## 2024-03-19 LAB — COMPREHENSIVE METABOLIC PANEL
ALT: 16 U/L (ref 0–44)
AST: 22 U/L (ref 15–41)
Albumin: 4.4 g/dL (ref 3.5–5.0)
Alkaline Phosphatase: 104 U/L (ref 38–126)
Anion gap: 18 — ABNORMAL HIGH (ref 5–15)
BUN: 10 mg/dL (ref 6–20)
CO2: 18 mmol/L — ABNORMAL LOW (ref 22–32)
Calcium: 9.7 mg/dL (ref 8.9–10.3)
Chloride: 103 mmol/L (ref 98–111)
Creatinine, Ser: 0.74 mg/dL (ref 0.44–1.00)
GFR, Estimated: >60 mL/min (ref >60)
Glucose, Bld: 125 mg/dL — ABNORMAL HIGH (ref 70–99)
Potassium: 3.2 mmol/L — ABNORMAL LOW (ref 3.5–5.1)
Sodium: 139 mmol/L (ref 135–145)
Total Bilirubin: 1.2 mg/dL (ref 0.0–1.2)
Total Protein: 8.4 g/dL — ABNORMAL HIGH (ref 6.5–8.1)

## 2024-03-19 LAB — URINALYSIS, ROUTINE W REFLEX MICROSCOPIC
Bilirubin Urine: NEGATIVE
Glucose, UA: NEGATIVE mg/dL
Ketones, ur: 80 mg/dL — AB
Nitrite: POSITIVE — AB
Protein, ur: 100 mg/dL — AB
Specific Gravity, Urine: 1.019 (ref 1.005–1.030)
WBC, UA: >50 WBC/hpf (ref 0–5)
pH: 6 (ref 5.0–8.0)

## 2024-03-19 LAB — ETHANOL: Alcohol, Ethyl (B): <10 mg/dL (ref <10)

## 2024-03-19 LAB — SARS CORONAVIRUS 2 BY RT PCR: SARS Coronavirus 2 by RT PCR: NEGATIVE

## 2024-03-19 LAB — HCG, SERUM, QUALITATIVE: Preg, Serum: NEGATIVE

## 2024-03-19 LAB — LIPASE, BLOOD: Lipase: 20 U/L (ref 11–51)

## 2024-03-19 MED ORDER — CHLORDIAZEPOXIDE HCL 25 MG PO CAPS
25.0000 mg | ORAL_CAPSULE | Freq: Once | ORAL | Status: AC
Start: 2024-03-19 — End: 2024-03-19
  Administered 2024-03-19: 25 mg via ORAL
  Filled 2024-03-19: qty 1

## 2024-03-19 MED ORDER — CHLORDIAZEPOXIDE HCL 25 MG PO CAPS
ORAL_CAPSULE | ORAL | 0 refills | Status: AC
  Filled 2024-03-19: qty 12, 3d supply, fill #0
  Filled 2024-03-22: qty 10, 2d supply, fill #0

## 2024-03-19 MED ORDER — THIAMINE HCL 100 MG/ML IJ SOLN
100.0000 mg | Freq: Once | INTRAMUSCULAR | Status: AC
  Administered 2024-03-19: 100 mg via INTRAMUSCULAR
  Filled 2024-03-19: qty 2

## 2024-03-19 MED ORDER — MAGNESIUM SULFATE 2 GM/50ML IV SOLN
2.0000 g | Freq: Once | INTRAVENOUS | Status: AC
  Administered 2024-03-19: 2 g via INTRAVENOUS
  Filled 2024-03-19: qty 50

## 2024-03-19 MED ORDER — HYDROXYZINE HCL 25 MG PO TABS: 25.0000 mg | ORAL_TABLET | Freq: Four times a day (QID) | ORAL | Status: DC | PRN

## 2024-03-19 MED ORDER — DROPERIDOL 2.5 MG/ML IJ SOLN
2.5000 mg | Freq: Once | INTRAMUSCULAR | Status: AC
  Administered 2024-03-19: 2.5 mg via INTRAVENOUS
  Filled 2024-03-19: qty 2

## 2024-03-19 MED ORDER — ONDANSETRON HCL 4 MG/2ML IJ SOLN: 4.0000 mg | Freq: Once | INTRAMUSCULAR | Status: DC

## 2024-03-19 MED ORDER — ONDANSETRON 4 MG PO TBDP
4.0000 mg | ORAL_TABLET | Freq: Once | ORAL | Status: AC
  Administered 2024-03-19: 4 mg via ORAL
  Filled 2024-03-19: qty 1

## 2024-03-19 MED ORDER — FAMOTIDINE IN NACL 20-0.9 MG/50ML-% IV SOLN
20.0000 mg | Freq: Once | INTRAVENOUS | Status: AC
  Administered 2024-03-19: 20 mg via INTRAVENOUS
  Filled 2024-03-19: qty 50

## 2024-03-19 MED ORDER — ADULT MULTIVITAMIN W/MINERALS CH: 1.0000 | ORAL_TABLET | Freq: Every day | ORAL | Status: DC

## 2024-03-19 MED ORDER — CEPHALEXIN 250 MG PO CAPS
500.0000 mg | ORAL_CAPSULE | Freq: Once | ORAL | Status: AC
  Administered 2024-03-19: 500 mg via ORAL
  Filled 2024-03-19: qty 2

## 2024-03-19 MED ORDER — FOLIC ACID 1 MG PO TABS
1.0000 mg | ORAL_TABLET | Freq: Once | ORAL | Status: AC
  Administered 2024-03-19: 1 mg via ORAL
  Filled 2024-03-19: qty 1

## 2024-03-19 MED ORDER — CHLORDIAZEPOXIDE HCL 25 MG PO CAPS: 25.0000 mg | ORAL_CAPSULE | Freq: Four times a day (QID) | ORAL | Status: DC | PRN

## 2024-03-19 MED ORDER — PROCHLORPERAZINE EDISYLATE 10 MG/2ML IJ SOLN: 5.0000 mg | Freq: Once | INTRAMUSCULAR | Status: DC

## 2024-03-19 MED ORDER — SODIUM CHLORIDE 0.9 % IV BOLUS
1000.0000 mL | Freq: Once | INTRAVENOUS | Status: AC
  Administered 2024-03-19: 1000 mL via INTRAVENOUS

## 2024-03-19 MED ORDER — ADULT MULTIVITAMIN W/MINERALS CH
1.0000 | ORAL_TABLET | Freq: Once | ORAL | Status: AC
  Administered 2024-03-19: 1 via ORAL
  Filled 2024-03-19: qty 1

## 2024-03-19 MED ORDER — THIAMINE HCL 100 MG/ML IJ SOLN
100.0000 mg | Freq: Once | INTRAMUSCULAR | Status: AC
  Administered 2024-03-19: 100 mg via INTRAVENOUS
  Filled 2024-03-19: qty 2

## 2024-03-19 MED ORDER — LOPERAMIDE HCL 2 MG PO CAPS: 2.0000 mg | ORAL_CAPSULE | ORAL | Status: DC | PRN

## 2024-03-19 MED ORDER — ONDANSETRON 4 MG PO TBDP: 4.0000 mg | ORAL_TABLET | Freq: Four times a day (QID) | ORAL | Status: DC | PRN

## 2024-03-19 MED ORDER — ONDANSETRON HCL 4 MG/2ML IJ SOLN
4.0000 mg | Freq: Once | INTRAMUSCULAR | Status: AC
  Administered 2024-03-19: 4 mg via INTRAVENOUS
  Filled 2024-03-19: qty 2

## 2024-03-19 NOTE — Telephone Encounter (Signed)
 Patients significant other LVM on nurse line requesting a call back.  He reports she has been experiencing abdominal pain and nausea.   I attempted too call SO back, however no answer and VM box was full.   It appears patient is in the ED now complaining of possible alcohol withdrawal.

## 2024-03-19 NOTE — ED Provider Notes (Signed)
 [place] EMERGENCY DEPARTMENT AT [facility] Provider Note   CSN: [id number] Arrival date & time: 03/19/24  1237     History  No chief complaint on file.   [name] is a 38 y.o. female with a history of alcohol use disorder reports for concern for alcohol withdrawal.  Patient reports concern for alcohol withdrawals.  She states that she drank approximately a bottle of liquor per day up until December 16.  She was admitted to this hospital for alcohol withdrawal and detoxification from December 16 until December 23.  Patient was sober until early March.  During this time, she states she was on medications that curb her cravings.  She then ran out of the medication and received a bridge from primary care provider, however had missed her primary care appointment for medication refill due to a new job.  She has been out of this medication since early March.  In early March, she started taking sips of alcohol.  2 days ago, she states she had an entire bottle of tequila over the day on Saturday.  Sunday morning, she woke up and felt okay, however by Sunday afternoon she was nauseous and vomiting.  She states she has not had diarrhea.  She had a bowel movement yesterday and is continuing to pass gas.  Patient reports a history of delirium tremens approximately 1 to 2 years ago.  Patient endorses that she smokes approximately half a pack of cigarettes per day.  She additionally endorses occasional marijuana use.  No other drug use noted.  HPI    Home Medications Prior to Admission medications   Medication Sig Start Date End Date Taking? Authorizing Provider  ferrous sulfate 325 (65 FE) MG tablet Take 1 tablet (325 mg total) by mouth every other day. 01/09/24   Baloch, Mahnoor, MD  hydrOXYzine (ATARAX) 10 MG tablet Take 1 tablet (10 mg total) by mouth 3 (three) times daily as needed for anxiety. 01/09/24   Baloch, Mahnoor, MD  Multiple Vitamin (MULTIVITAMIN WITH MINERALS) TABS  tablet Take 1 tablet by mouth daily.    [provider]  naltrexone (DEPADE) 50 MG tablet Take 1 tablet (50 mg total) by mouth daily. 01/09/24   Baloch, Mahnoor, MD  ondansetron (ZOFRAN-ODT) 4 MG disintegrating tablet Take 1 tablet (4 mg total) by mouth every 8 (eight) hours as needed for up to 20 doses for nausea or vomiting. 12/17/23   Lockie Mola, MD  Prenatal Vit-Fe Fumarate-FA (PRENATAL MULTIVITAMIN) TABS tablet Take 1 tablet by mouth daily at 12 noon. 12/17/23   Vonna Drafts, MD  sertraline (ZOLOFT) 50 MG tablet Take 1 tablet (50 mg total) by mouth daily. 01/09/24   Baloch, Mahnoor, MD  fluticasone (FLONASE) 50 MCG/ACT nasal spray Place 1 spray into both nostrils daily. 12/13/18 07/06/20  Aviva Kluver B, PA-C  omeprazole (PRILOSEC) 20 MG capsule Take 1 capsule (20 mg total) by mouth daily. Patient not taking: Reported on 12/13/2018 01/28/17 07/06/20  Glynn Octave, MD      Allergies    Patient has no known allergies.    Review of Systems   Review of Systems  Physical Exam Updated Vital Signs BP 116/78   Pulse 100   Resp (!) 28   Ht 5\' 2"  (1.575 m)   Wt 67.4 kg   SpO2 99%   BMI 27.18 kg/m  Physical Exam Vitals and nursing note reviewed.  Constitutional:      General: She is not in acute distress.  Appearance: She is ill-appearing.  HENT:     Head: Normocephalic and atraumatic.     Mouth/Throat:     Lips: Pink. No lesions.  Eyes:     General: Lids are normal.     Extraocular Movements: Extraocular movements intact.     Conjunctiva/sclera: Conjunctivae normal.     Pupils: Pupils are equal, round, and reactive to light.  Cardiovascular:     Rate and Rhythm: Regular rhythm. Bradycardia present.  Pulmonary:     Effort: Pulmonary effort is normal.     Breath sounds: Normal breath sounds.  Abdominal:     Palpations: Abdomen is soft.     Tenderness: There is abdominal tenderness in the epigastric area. There is no right CVA tenderness or left CVA tenderness.   Musculoskeletal:     Cervical back: Normal range of motion.  Skin:    General: Skin is warm and dry.     Capillary Refill: Capillary refill takes 2 to 3 seconds.  Neurological:     General: No focal deficit present.     Mental Status: She is alert and oriented to person, place, and time.  Psychiatric:        Mood and Affect: Mood normal.        Speech: Speech normal.        Behavior: Behavior is cooperative.    ED Results / Procedures / Treatments   Labs (all labs ordered are listed, but only abnormal results are displayed) Labs Reviewed  COMPREHENSIVE METABOLIC PANEL - Abnormal; Notable for the following components:      Result Value   Potassium 3.2 (*)    CO2 18 (*)    Glucose, Bld 125 (*)    Total Protein 8.4 (*)    Anion gap 18 (*)    All other components within normal limits  CBC - Abnormal; Notable for the following components:   WBC 12.2 (*)    Platelets 404 (*)    All other components within normal limits  SARS CORONAVIRUS 2 BY RT PCR  LIPASE, BLOOD  HCG, SERUM, QUALITATIVE  URINALYSIS, ROUTINE W REFLEX MICROSCOPIC  ETHANOL    EKG None  Radiology No results found.  Procedures Procedures  {Document cardiac monitor, telemetry assessment procedure when appropriate:1}  Medications Ordered in ED Medications  ondansetron (ZOFRAN-ODT) disintegrating tablet 4 mg (4 mg Oral Given 03/19/24 1312)  chlordiazePOXIDE (LIBRIUM) capsule 25 mg (25 mg Oral Given 03/19/24 1429)    ED Course/ Medical Decision Making/ A&P Clinical Course as of 03/19/24 1842  Mon Mar 19, 2024  1824 Re-assess after completion of fluids (plan for d/c with librium taper) [HG]    Clinical Course User Index [HG] Renella Cunas, MD   {   Click here for ABCD2, HEART and other calculatorsREFRESH Note before signing :1}                              Medical Decision Making Amount and/or Complexity of Data Reviewed Labs: ordered.  Risk Prescription drug management.   38 y.o. female with a  history of alcohol use disorder reports for concern for alcohol withdrawal.  At initial time of triage, patient was normotensive, tachycardic, tachypneic, and satting appropriately on room air.  On my evaluation 3 hours later, patient was bradycardic and respiratory rate was within normal limits.  Other vital signs remained reassuring.  Physical exam notable for hypovolemia and epigastric tenderness to palpation.  Differential diagnosis includes  alcohol intoxication, alcohol withdrawal, electrolyte or metabolic derangement, alcoholic ketoacidosis, DKA, starvation ketosis, pancreatitis, pregnancy.  Leukocytosis of 12.2, however there is suspicion for hemodilution given elevation of WBC, hemoglobin, platelets.  CMP is notable for a bicarb of 18, appropriate glucose 125, hypokalemia 3.2, and an elevated anion gap of 18.  Urinalysis shows elevated ketones, further consistent with alcoholic ketoacidosis.  Urinalysis is also concerning for UTI given many bacteria, leukocyte esterase, nitrites.  Low suspicion for pyelonephritis as patient has no CVA tenderness.  Will treat UTI with a dose of Keflex here and plan to discharge on Keflex x 1 week.  Lipase WNL; low suspicion for pancreatitis.  Beta-hCG negative.  Ethanol negative; patient is not currently intoxicated.  COVID-negative.  EKG is notable for sinus bradycardia with a rate of 49 bpm.  Appropriate PR, QRS, QTc intervals.  In triage, patient had received 4 mg of p.o. Zofran and 25 mg Librium.  On my evaluation, administered thiamine, magnesium, multivitamin, folic acid, as well as 4 mg of IV Zofran.  Additionally administered Pepcid with epigastric tenderness with some concern for GERD as a cause of symptoms.  Patient with continued emesis following the p.o. and IV Zofran.  She continues to endorse nausea and abdominal pain.  Administered 2.5 mg of IV droperidol with resolution of abdominal pain as well as nausea and vomiting. No further episodes of emesis  while in the ED.    {Document critical care time when appropriate:1} {Document review of labs and clinical decision tools ie heart score, Chads2Vasc2 etc:1}  {Document your independent review of radiology images, and any outside records:1} {Document your discussion with family members, caretakers, and with consultants:1} {Document social determinants of health affecting pt's care:1} {Document your decision making why or why not admission, treatments were needed:1} Final Clinical Impression(s) / ED Diagnoses Final diagnoses:  None    Rx / DC Orders ED Discharge Orders     None      Renella Cunas, PGY-2 Emergency Medicine

## 2024-03-19 NOTE — ED Provider Triage Note (Signed)
 Emergency Medicine Provider Triage Evaluation Note  [name] , a 38 y.o. female  was evaluated in triage.  Pt complains of nausea, vomiting, generalized abdominal pain, after stopping drinking about 2 days ago.  She states that she stopped drinking for an extended period of time, but started again 2 weeks ago, and has been intermittently drinking, last drink was on 3/22.  She drink quite a bit on 3/21.  Since then has just been feeling very poorly and having some nausea and vomiting.  States she was previously on medication, for her withdrawal, but has been off of it for 2 weeks.  Denies any fevers, chills, sore throat.  Review of Systems  Positive: Nausea Negative: Chest pain  Physical Exam  BP 116/78   Pulse 100   Resp (!) 28   Ht 5\' 2"  (1.575 m)   Wt 67.4 kg   SpO2 99%   BMI 27.18 kg/m  Gen:   Awake, no distress   Resp:  Normal effort  MSK:   Moves extremities without difficulty  Other:  Nontremulous  Medical Decision Making  Medically screening exam initiated at 2:16 PM.  Appropriate orders placed.  Lucina Mellow was informed that the remainder of the evaluation will be completed by another provider, this initial triage assessment does not replace that evaluation, and the importance of remaining in the ED until their evaluation is complete.     Pete Pelt, [place] 03/19/24 1417

## 2024-03-19 NOTE — ED Triage Notes (Addendum)
 Pt c/o N/V, generalized abd pain started yesterday. Pt states she's having ETOH withdrawal. Pt states last ETOH was Saturday night. Pt actively vomiting in triage

## 2024-03-19 NOTE — ED Notes (Signed)
Patient drank fluids tolerated well

## 2024-03-20 ENCOUNTER — Other Ambulatory Visit (HOSPITAL_COMMUNITY): Payer: Self-pay

## 2024-03-20 ENCOUNTER — Other Ambulatory Visit: Payer: Self-pay

## 2024-03-20 MED ORDER — CEPHALEXIN 500 MG PO CAPS
500.0000 mg | ORAL_CAPSULE | Freq: Two times a day (BID) | ORAL | 0 refills | Status: AC
  Filled 2024-03-20: qty 14, 7d supply, fill #0

## 2024-03-21 ENCOUNTER — Other Ambulatory Visit (HOSPITAL_COMMUNITY): Payer: Self-pay

## 2024-03-22 ENCOUNTER — Other Ambulatory Visit (HOSPITAL_COMMUNITY): Payer: Self-pay

## 2024-05-04 ENCOUNTER — Other Ambulatory Visit

## 2024-05-04 ENCOUNTER — Other Ambulatory Visit (HOSPITAL_COMMUNITY): Payer: Self-pay

## 2024-05-04 ENCOUNTER — Ambulatory Visit (INDEPENDENT_AMBULATORY_CARE_PROVIDER_SITE_OTHER): Admitting: Student

## 2024-05-04 ENCOUNTER — Ambulatory Visit: Admitting: Family Medicine

## 2024-05-04 VITALS — BP 109/80 | HR 104 | Ht 62.0 in | Wt 144.6 lb

## 2024-05-04 DIAGNOSIS — F419 Anxiety disorder, unspecified: Secondary | ICD-10-CM | POA: Diagnosis not present

## 2024-05-04 DIAGNOSIS — D500 Iron deficiency anemia secondary to blood loss (chronic): Secondary | ICD-10-CM | POA: Diagnosis not present

## 2024-05-04 DIAGNOSIS — F1029 Alcohol dependence with unspecified alcohol-induced disorder: Secondary | ICD-10-CM

## 2024-05-04 MED ORDER — SERTRALINE HCL 50 MG PO TABS
50.0000 mg | ORAL_TABLET | Freq: Every day | ORAL | 1 refills | Status: AC
  Filled 2024-05-04: qty 30, 30d supply, fill #0
  Filled 2024-06-01: qty 30, 30d supply, fill #1

## 2024-05-04 MED ORDER — HYDROXYZINE HCL 10 MG PO TABS
10.0000 mg | ORAL_TABLET | Freq: Three times a day (TID) | ORAL | 1 refills | Status: DC | PRN
  Filled 2024-05-04: qty 30, 10d supply, fill #0
  Filled 2024-05-17: qty 30, 10d supply, fill #1

## 2024-05-04 MED ORDER — NALTREXONE HCL 50 MG PO TABS
50.0000 mg | ORAL_TABLET | Freq: Every day | ORAL | 2 refills | Status: AC
  Filled 2024-05-04: qty 30, 30d supply, fill #0
  Filled 2024-06-01: qty 30, 30d supply, fill #1

## 2024-05-04 MED ORDER — HYDROXYZINE HCL 10 MG PO TABS
10.0000 mg | ORAL_TABLET | Freq: Three times a day (TID) | ORAL | 1 refills | Status: DC | PRN
Start: 2024-05-04 — End: 2024-05-04
  Filled 2024-05-04: qty 30, 10d supply, fill #0

## 2024-05-04 NOTE — Progress Notes (Deleted)
    SUBJECTIVE:   CHIEF COMPLAINT / HPI:   ***  PERTINENT  PMH / PSH: ***  OBJECTIVE:   There were no vitals taken for this visit.  ***  ASSESSMENT/PLAN:   Assessment & Plan      Johnella Naas, MD [facility]

## 2024-05-04 NOTE — Assessment & Plan Note (Addendum)
 Abstinent for over a week and outside of withdrawal window. Previous treatment with naltrexone  was helpful. Discussed resuming naltrexone  and therapy's role. Informed naltrexone  contraindicated in pregnancy.  - Prescribe naltrexone  to reduce alcohol  cravings. - CMP today - Recommend attending AA meetings - Refer to community resource team for additional support and resources. - Advised to stop naltrexone  if she becomes pregnant - f/u with PCP within 1 month or sooner if needed

## 2024-05-04 NOTE — Addendum Note (Signed)
 Addended by: Sidonie Drape on: 05/04/2024 03:18 PM   Modules accepted: Orders

## 2024-05-04 NOTE — Progress Notes (Cosign Needed Addendum)
    SUBJECTIVE:   CHIEF COMPLAINT / HPI:   The patient, with anxiety and alcohol  use disorder, presents seeking medication refills and support.  They have significant anxiety and have not been taking Zoloft  and Atarax  due to missing their last appointment and being unable to get a refill. They recently started and left a new job and are feeling anxious, angry, and emotional.  She denies any SI.  They have not consumed alcohol  for a week and two days and aside from feeling anxious, denies having any withdrawal symptoms. 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   They have difficulty sleeping, often waking up every hour or not sleeping at all. Sleep was better when anxiety was managed with Zoloft  and Atarax .   They have monthly menstrual periods which are reported to be heavy, with the last period ending about three weeks ago, and are not on birth control but she has low concern that she is pregnant.  Previously took an iron  supplement every other day but has not taken it in over a month.   PERTINENT  PMH / PSH: Alcohol  dependence, anxiety OBJECTIVE:   BP 109/80   Pulse (!) 104   Ht 5\' 2"  (1.575 m)   Wt 144 lb 9.6 oz (65.6 kg)   LMP 04/10/2024   SpO2 99%   BMI 26.45 kg/m    General: NAD, pleasant, able to participate in exam Cardiac: Tachycardic, regular rhythm Respiratory: Normal effort Skin: warm and dry Neuro: alert, no obvious focal deficits, no tremors Psych: Appears slightly anxious but speaking calmly with organized thought  ASSESSMENT/PLAN:   Anxiety Exacerbated by life stressors, trying to abstain from alcohol , non-adherence to medication. Symptoms include emotional distress, irritability, and insomnia. Previous treatment with sertraline  and hydroxyzine  was effective.  - Prescribe sertraline  50 mg for anxiety and depression. - Prescribe hydroxyzine  10 mg prn for anxiety. -  Provide therapy resources and recommend using psychologytoday.com to find a therapist. - Refer to community resource team ([facility]) for additional support and resources.  Alcohol  dependence (HCC) Abstinent for over a week and outside of withdrawal window. Previous treatment with naltrexone  was helpful. Discussed resuming naltrexone  and therapy's role. Informed naltrexone  contraindicated in pregnancy.  - Prescribe naltrexone  to reduce alcohol  cravings. - CMP today - Recommend attending AA meetings - Refer to community resource team for additional support and resources. - Advised to stop naltrexone  if she becomes pregnant - f/u with PCP within 1 month or sooner if needed  Anemia Has not taken iron  supplement in over a month.  Mild tachycardia could be related to anemia versus anxiety.  Will check CBC and iron  studies today and refill iron  supplement if needed   1 month follow-up with PCP and will need hep C screening and Pap smear if appropriate  Dr. Glenn Lange, DO [facility]

## 2024-05-04 NOTE — Assessment & Plan Note (Addendum)
 Has not taken iron  supplement in over a month.  Mild tachycardia could be related to anemia versus anxiety.  Will check CBC and iron  studies today and refill iron  supplement if needed

## 2024-05-04 NOTE — Assessment & Plan Note (Signed)
 Exacerbated by life stressors, trying to abstain from alcohol , non-adherence to medication. Symptoms include emotional distress, irritability, and insomnia. Previous treatment with sertraline  and hydroxyzine  was effective.  - Prescribe sertraline  50 mg for anxiety and depression. - Prescribe hydroxyzine  10 mg prn for anxiety. - Provide therapy resources and recommend using psychologytoday.com to find a therapist. - Refer to community resource team ([facility]) for additional support and resources.

## 2024-05-04 NOTE — Patient Instructions (Addendum)
 It was great to see you! Thank you for allowing me to participate in your care!  I recommend that you always bring your medications to each appointment as this makes it easy to ensure you are on the correct medications and helps us  not miss when refills are needed.  Our plans for today:  - Go to psychologytoday.com to find a therapist  - I have refilled your medications for anxiety and to help reduce cravings for alcohol  -Return in 1 month for a follow up visit with your PCP  We are checking some labs today, I will call you if they are abnormal will send you a MyChart message or a letter if they are normal.  If you do not hear about your labs in the next 2 weeks please let us  know.  Take care and seek immediate care sooner if you develop any concerns.   Dr. Glenn Lange, DO [facility]   Therapy and Counseling Resources Most providers on this list will take Medicaid. Patients with commercial insurance or Medicare should contact their insurance company to get a list of in network providers.  [facility] (spanish speaking therapist available)(habla espanol)(take medicare and medicaid)  [address], [place], [place] [address], [place]  [email or web address] [phone]  [facility]:Psychiatry and Counseling [address] [place], [place] [address] [phone]  [facility]   [address], [address] [place], [place] [address]      [phone]  [facility] (spanish available) [address]  [place], [place] [address] [phone]  [facility] (take [facility] and medicare) [address], [address]  [place], [place] [address]       [phone]     [facility] (virtual only) [phone]  [facility] [address] [place], [place] [phone]  [facility]:  [address] [employer] [phone]  [facility]:  [address] [place]  [phone]  [facility] (under & uninsured) [address]   [place] [phone]    [email or web address]     Behavioral Health [address]  [place]    [phone]  [facility] -[address]     Phone:  [phone]     [facility]-  [address]  [phone]   [facility] [address]      [place], [place] [phone]  [facility]: [address] [place], [place], [place]  [phone]   [facility] (Spanish therapist) [email or web address]  [address]  [address]   [place] [address]    [phone]    [facility]   [address],  [place], [place]  [phone]   [facility]  [address] [place]  [phone]  [facility]  [address] [place], [place]        [phone]  Open Access/Walk In Clinic under & uninsured  [facility]  [address] [place], [place] [phone] Crisis [phone]  [facility] the [address],  (Spanish)   [address] , [place]: [phone]) 8:30 - 12; 1 - 2:30  [facility] of [facility] [employer],  [address] [place], [place]    [phone]):8:30 - 12; 2 - 3PM  [facility],  [address]  [place]; [phone]):  Mon - Fri 8 AM - 5 PM  [facility] [address] [place]  MWF 12:30 to 3:00 or call to schedule an appointment  [phone]  Specific Provider options Psychology Today  https://www.psychologytoday.com/us  click on find a therapist  enter your zip code left side and select or tailor a therapist for your specific need.   [facility] Provider Directory [email or web address]  (Medicaid)   Follow all drop down to find a provider  Social Support program Mental Health  [place] [phone] or PhotoSolver.pl [address], [place], [place] Recovery support and educational   24- Hour Availability:   [facility]  [address] [place], [place] [phone] Crisis [phone]  [facility] of the [facility] [phone]  [place] [facility]  [phone]   [facility]  [phone] (after hours)  Therapeutic Alternative/Mobile Crisis   6266267882  [place]  National Suicide Hotline  (680) [phone] Derrel Flies)  Call 911 or go to emergency room  [place]  [phone]);  [place] and [employer]  [phone]); [place], [place], [place], [place], [place], [place], [place]            Therapy and Counseling Resources Most providers on this list will take Medicaid. Patients with commercial insurance or Medicare should contact their insurance company to get a list of in network providers.  [facility] (spanish speaking therapist available)(habla espanol)(take medicare and medicaid)  [address], [place], [place] [address], [place]  [email or web address] [phone]  [facility]:Psychiatry and Counseling [address] [facility] [address] [place], [place] [address] [phone]  [facility]   [address], [address] [place], [place] [address]      [phone]  [facility] (spanish available) [address]  [place], [place] [address] [phone]  [facility] (take [facility] and medicare) [address], [address]  [place], [place] [address]       [phone]     [facility] (virtual only) [phone]  [facility] [address] [place], [place] [phone]  [facility]:  [address] [place] [phone]  [facility]:  [address] [place] [phone]  [facility] (under & uninsured) [address]   [place] [phone]    [email or web address]    [place] [phone]  [place]    [phone]  [facility] -[address]     Phone:  [phone]     [place]-  [address]  [phone]   [facility] [address]      [place], [place] [phone]  [facility]: [address] [place], [place], [place]  [phone]   [facility] (Spanish therapist) [email or web address]  [address] [place]  [address]   [place] [address]    [phone]    [facility]   [address],  [place], [place]  [phone]   [facility]  [address] [place]  [phone]  [facility]  [address] [place], [place]        [phone]  Open Access/Walk In Clinic under & uninsured  [facility]  [address] [place], [place] [phone] Crisis [phone]  [facility] the [address],  (Spanish)   [address] , [place]  [place]: [phone]) 8:30 - 12; 1 - 2:30  [facility] [employer],  [address], [place]    [phone]):8:30 - 12; 2 - 3PM  [facility] [employer],  [address]  [place]; [phone]):   Mon - Fri 8 AM - 5 PM  [facility]  [facility] Drug Services [address] [place]  MWF 12:30 to 3:00 or call to schedule an appointment  [phone]  Specific Provider options Psychology Today  https://www.psychologytoday.com/us  click on find a therapist  enter your zip code left side and select or tailor a therapist for your specific need.   [facility] Provider Directory [email or web address]  (Medicaid)   Follow all drop down to find a provider  Social Support program Mental Health [place] [phone] or PhotoSolver.pl [address],  [place], [place] Recovery support and educational   24- Hour Availability:   [facility]  [address] [place], [place] [phone] Crisis [phone]  [facility] [phone]  [place] [facility] Service  [phone]   [facility]  [phone] (after hours)  Therapeutic Alternative/Mobile Crisis   [phone]  [place]  National Suicide Hotline  517-868-4990 Derrel Flies)  Call 911 or go to emergency room  [facility]  [phone]);  [place] and [employer]  [phone]); [place], [place], [place], [place], [place], [place], [place]

## 2024-05-05 ENCOUNTER — Telehealth: Payer: Self-pay | Admitting: Student

## 2024-05-05 ENCOUNTER — Telehealth: Payer: Self-pay | Admitting: Family Medicine

## 2024-05-05 DIAGNOSIS — E876 Hypokalemia: Secondary | ICD-10-CM

## 2024-05-05 DIAGNOSIS — F1029 Alcohol dependence with unspecified alcohol-induced disorder: Secondary | ICD-10-CM

## 2024-05-05 DIAGNOSIS — D500 Iron deficiency anemia secondary to blood loss (chronic): Secondary | ICD-10-CM

## 2024-05-05 LAB — CBC
Hematocrit: 39.8 % (ref 34.0–46.6)
Hemoglobin: 13.4 g/dL (ref 11.1–15.9)
MCH: 29.7 pg (ref 26.6–33.0)
MCHC: 33.7 g/dL (ref 31.5–35.7)
MCV: 88 fL (ref 79–97)
Platelets: 304 10*3/uL (ref 150–450)
RBC: 4.51 x10E6/uL (ref 3.77–5.28)
RDW: 15.2 % (ref 11.7–15.4)
WBC: 10.8 10*3/uL (ref 3.4–10.8)

## 2024-05-05 LAB — IRON,TIBC AND FERRITIN PANEL
Ferritin: 183 ng/mL — ABNORMAL HIGH (ref 15–150)
Iron Saturation: 8 % — CL (ref 15–55)
Iron: 38 ug/dL (ref 27–159)
Total Iron Binding Capacity: 495 ug/dL — ABNORMAL HIGH (ref 250–450)
UIBC: 457 ug/dL — ABNORMAL HIGH (ref 131–425)

## 2024-05-05 LAB — COMPREHENSIVE METABOLIC PANEL WITH GFR
ALT: 18 IU/L (ref 0–32)
AST: 20 IU/L (ref 0–40)
Albumin: 4.8 g/dL (ref 3.9–4.9)
Alkaline Phosphatase: 106 IU/L (ref 44–121)
BUN/Creatinine Ratio: 13 (ref 9–23)
BUN: 13 mg/dL (ref 6–20)
Bilirubin Total: 1 mg/dL (ref 0.0–1.2)
CO2: 25 mmol/L (ref 20–29)
Calcium: 9.8 mg/dL (ref 8.7–10.2)
Chloride: 92 mmol/L — ABNORMAL LOW (ref 96–106)
Creatinine, Ser: 1.01 mg/dL — ABNORMAL HIGH (ref 0.57–1.00)
Globulin, Total: 3.2 g/dL (ref 1.5–4.5)
Glucose: 87 mg/dL (ref 70–99)
Potassium: 3.1 mmol/L — ABNORMAL LOW (ref 3.5–5.2)
Sodium: 136 mmol/L (ref 134–144)
Total Protein: 8 g/dL (ref 6.0–8.5)
eGFR: 74 mL/min/{1.73_m2} (ref >59)

## 2024-05-05 MED ORDER — PRENATAL VITAMINS 28-0.8 MG PO TABS: 1.0000 | ORAL_TABLET | Freq: Every day | ORAL | 0 refills | Status: AC

## 2024-05-05 MED ORDER — FERROUS SULFATE 325 (65 FE) MG PO TABS
325.0000 mg | ORAL_TABLET | ORAL | 0 refills | Status: AC
Start: 2024-05-05 — End: ?

## 2024-05-05 MED ORDER — POTASSIUM CHLORIDE CRYS ER 20 MEQ PO TBCR: 40.0000 meq | EXTENDED_RELEASE_TABLET | Freq: Every day | ORAL | 0 refills | Status: AC | PRN

## 2024-05-05 MED ORDER — ADULT MULTIVITAMIN W/MINERALS CH: 1.0000 | ORAL_TABLET | Freq: Every day | ORAL | 0 refills | Status: DC

## 2024-05-05 MED ORDER — POTASSIUM CHLORIDE CRYS ER 20 MEQ PO TBCR: 40.0000 meq | EXTENDED_RELEASE_TABLET | Freq: Every day | ORAL | 0 refills | Status: DC | PRN

## 2024-05-05 NOTE — Telephone Encounter (Signed)
 Received after-hours call.  Patient reports that her potassium supplement prescribed by Dr. Rochelle Chu was sent to [facility] pharmacy which is closed today.  I will [facility].

## 2024-05-05 NOTE — Telephone Encounter (Addendum)
 Called pt to discuss recent lab results showing nml hemoglobin but low iron  levels. Elevated ferritin likely in setting of alcohol  use.  She agrees to start back on every other day iron  supplement which has been sent to pharmacy. Also discussed hypokalemia to 3.1 and slightly elevated Cr to 1.01 (BL ~ 0.7). Pt states she hasn't been eating well, a little nauseous with low appetite in setting of anxiety and also abstaining from alcohol  but that this is improving ans she was able to eat breakfast this morning.  I sent in potassium 40 mEq to take today and advised to go ahead and take 1 tab (20 mEq) tomorrow and encouraged PO intake. Lab only apt made for Monday to recheck K and Cr.  She would like to start back on a multi vitamin, prenatal sent to pharmacy

## 2024-05-05 NOTE — Addendum Note (Signed)
 Addended by: Sidonie Drape on: 05/05/2024 10:11 AM   Modules accepted: Orders

## 2024-05-07 ENCOUNTER — Other Ambulatory Visit: Payer: Self-pay | Admitting: Student

## 2024-05-07 ENCOUNTER — Other Ambulatory Visit: Payer: Self-pay

## 2024-05-07 ENCOUNTER — Telehealth: Payer: Self-pay | Admitting: *Deleted

## 2024-05-07 DIAGNOSIS — E876 Hypokalemia: Secondary | ICD-10-CM

## 2024-05-07 NOTE — Progress Notes (Signed)
 Future BMP ordered to recheck K and Cr at lab apt for 10:45 this morning. See previous phone note for details

## 2024-05-07 NOTE — Progress Notes (Signed)
 Complex Care Management Note  Care Guide Note 05/07/2024 Name: [name] MRN: [id number] DOB: 1986/10/08  [name] is a 38 y.o. year old female who sees Baloch, Mahnoor, MD for primary care. I reached out to [name] by phone today to offer complex care management services.  [name] was given information about Complex Care Management services today including:   The Complex Care Management services include support from the care team which includes your Nurse Care Manager, Clinical Social Worker, or Pharmacist.  The Complex Care Management team is here to help remove barriers to the health concerns and goals most important to you. Complex Care Management services are voluntary, and the patient may decline or stop services at any time by request to their care team member.   Complex Care Management Consent Status: Patient agreed to services and verbal consent obtained.   Follow up plan:  Telephone appointment with complex care management team member scheduled for:  5/13  Encounter Outcome:  Patient Scheduled  Barnie Bora  [facility]  [facility], [facility] Guide  Direct Dial: [phone]  Fax [phone]

## 2024-05-08 ENCOUNTER — Other Ambulatory Visit: Payer: Self-pay | Admitting: Licensed Clinical Social Worker

## 2024-05-08 NOTE — Patient Outreach (Signed)
 Complex Care Management   Visit Note  05/08/2024  Name:  [name] MRN: [id number] DOB: May 04, 1986  Situation: Referral received for Complex Care Management related to SDOH Barriers:  Housing insecurity I obtained verbal consent from Patient.  Visit completed with Faythe Hopes   on the phone  Background:   Past Medical History:  Diagnosis Date   Abdominal pain, epigastric 04/17/2013   Adjustment disorder with emotional disturbance    Alcoholic gastritis 03/2013   hospitalization   Dehydration 04/04/2013   Hematemesis 03/2013   along with alcoholic gastritis, hospitalization   Hematemesis- mild 04/04/2013   Intractable nausea, vomiting & abdominal pain. 04/04/2013   Metabolic acidosis 03/2013   hospitaliation due to alcohol  abuse, dehyration, nausea/vomiting   Metabolic acidosis, increased anion gap 04/04/2013   Polysubstance abuse (HCC)    Pyelonephritis 03/2010   hospitalization   Seizures (HCC)    Stomach ulcer    never had any tests to confirm this diagni=oses   Tobacco use 04/04/2013    Assessment: Patient Reported Symptoms:  Cognitive Cognitive Status: Able to follow simple commands, Alert and oriented to person, place, and time      Neurological Neurological Review of Symptoms: No symptoms reported    HEENT HEENT Symptoms Reported: No symptoms reported      Cardiovascular Cardiovascular Symptoms Reported: No symptoms reported    Respiratory Respiratory Symptoms Reported: No symptoms reported    Endocrine Patient reports the following symptoms related to hypoglycemia or hyperglycemia : No symptoms reported    Gastrointestinal Gastrointestinal Symptoms Reported: No symptoms reported      Genitourinary Genitourinary Symptoms Reported: No symptoms reported    Integumentary Integumentary Symptoms Reported: No symptoms reported    Musculoskeletal Musculoskelatal Symptoms Reviewed: No symptoms reported        Psychosocial Psychosocial Symptoms Reported:  No symptoms reported            05/04/2024    2:21 PM  Depression screen PHQ 2/9  Decreased Interest 2  Down, Depressed, Hopeless 1  PHQ - 2 Score 3  Altered sleeping 3  Tired, decreased energy 1  Change in appetite 2  Feeling bad or failure about yourself  1  Trouble concentrating 1  Moving slowly or fidgety/restless 0  Suicidal thoughts 0  PHQ-9 Score 11    There were no vitals filed for this visit.  Medications Reviewed Today     Reviewed by Fletcher Humble, LCSW (Social Worker) on 05/08/24 at 1811  Med List Status: <None>   Medication Order Taking? Sig Documenting Provider Last Dose Status Informant  ferrous sulfate  325 (65 FE) MG tablet [id number]  Take 1 tablet (325 mg total) by mouth every other day. Glenn Lange, DO  Active     Discontinued 07/06/20 1509   hydrOXYzine  (ATARAX ) 10 MG tablet [id number]  Take 1 tablet (10 mg total) by mouth 3 (three) times daily as needed for anxiety. Glenn Lange, DO  Active   naltrexone  (DEPADE) 50 MG tablet [id number]  Take 1 tablet (50 mg total) by mouth daily. Glenn Lange, DO  Active   Patient not taking:  Discontinued 07/06/20 1509 ondansetron  (ZOFRAN -ODT) 4 MG disintegrating tablet [id number] No Take 1 tablet (4 mg total) by mouth every 8 (eight) hours as needed for up to 20 doses for nausea or vomiting. Santa Cuba, MD Past Week Active Self  potassium chloride  SA (KLOR-CON  M) 20 MEQ tablet [id number]  Take 2 tablets (40 mEq total) by mouth daily as needed. Only take as directed  by your doctor. Albin Huh, MD  Active   Prenatal Vit-Fe Fumarate-FA (PRENATAL VITAMINS) 28-0.8 MG TABS [id number]  Take 1 tablet by mouth daily. Glenn Lange, DO  Active   sertraline  (ZOLOFT ) 50 MG tablet [id number]  Take 1 tablet (50 mg total) by mouth daily. Glenn Lange, DO  Active             Recommendation:   Contact housing and mental health community resources given: [place]. [place], [employer]   Follow Up Plan:   Telephone follow  up appointment date/time:  05/23/2024  Fletcher Humble MSW, LCSW Licensed Clinical Social Worker  [facility], Population Health Direct Dial: [phone]  Fax: [phone]

## 2024-05-08 NOTE — Patient Instructions (Signed)
 Visit Information  Thank you for taking time to visit with me today. Please don't hesitate to contact me if I can be of assistance to you before our next scheduled appointment.  Your next care management appointment is by telephone on 05/23/2024 at 3pm  Telephone follow up appointment date/time:  05/23/2024  Please call the care guide team at [phone] if you need to cancel, schedule, or reschedule an appointment.   Please call 911 if you are experiencing a Mental Health or Behavioral Health Crisis or need someone to talk to.  Fletcher Humble MSW, LCSW Licensed Clinical Social Worker  [facility], Population Health Direct Dial: [phone]  Fax: [phone]

## 2024-05-10 ENCOUNTER — Ambulatory Visit: Admitting: Family Medicine

## 2024-05-15 ENCOUNTER — Ambulatory Visit: Admitting: Family Medicine

## 2024-05-17 ENCOUNTER — Other Ambulatory Visit (HOSPITAL_COMMUNITY): Payer: Self-pay

## 2024-05-17 ENCOUNTER — Ambulatory Visit

## 2024-05-23 ENCOUNTER — Other Ambulatory Visit: Payer: Self-pay | Admitting: Licensed Clinical Social Worker

## 2024-05-29 ENCOUNTER — Other Ambulatory Visit (HOSPITAL_COMMUNITY): Payer: Self-pay

## 2024-05-29 ENCOUNTER — Other Ambulatory Visit: Payer: Self-pay

## 2024-05-29 ENCOUNTER — Ambulatory Visit (INDEPENDENT_AMBULATORY_CARE_PROVIDER_SITE_OTHER): Admitting: Student

## 2024-05-29 ENCOUNTER — Encounter: Payer: Self-pay | Admitting: Student

## 2024-05-29 ENCOUNTER — Ambulatory Visit: Payer: Self-pay | Admitting: Student

## 2024-05-29 VITALS — BP 90/60 | HR 80 | Ht 62.0 in | Wt 141.0 lb

## 2024-05-29 DIAGNOSIS — R300 Dysuria: Secondary | ICD-10-CM

## 2024-05-29 DIAGNOSIS — N390 Urinary tract infection, site not specified: Secondary | ICD-10-CM | POA: Insufficient documentation

## 2024-05-29 DIAGNOSIS — N3000 Acute cystitis without hematuria: Secondary | ICD-10-CM

## 2024-05-29 LAB — POCT URINALYSIS DIP (MANUAL ENTRY)
Bilirubin, UA: NEGATIVE
Blood, UA: NEGATIVE
Glucose, UA: NEGATIVE mg/dL
Nitrite, UA: POSITIVE — AB
Protein Ur, POC: 100 mg/dL — AB
Spec Grav, UA: 1.02 (ref 1.010–1.025)
Urobilinogen, UA: 1 U/dL
pH, UA: 5.5 (ref 5.0–8.0)

## 2024-05-29 LAB — POCT UA - MICROSCOPIC ONLY
RBC, Urine, Miroscopic: NONE SEEN (ref 0–2)
WBC, Ur, HPF, POC: >20 (ref 0–5)

## 2024-05-29 MED ORDER — CEPHALEXIN 500 MG PO CAPS
500.0000 mg | ORAL_CAPSULE | Freq: Four times a day (QID) | ORAL | 0 refills | Status: AC
  Filled 2024-05-29 (×2): qty 28, 7d supply, fill #0

## 2024-05-29 NOTE — Patient Instructions (Signed)
 It was great seeing you today.  As we discussed, - Start taking oral antibiotic (cephalexin ) sent to your pharmacy - I will call if we need to make any changes after the rest of your urine testing comes back  If unable to keep down fluids, fever persists, abdominal pain worsens or blood in urine, please go to the ER   If you have any questions or concerns, please feel free to call the clinic.   Have a wonderful day,  Dr. Vallorie Gayer [facility] [phone]

## 2024-05-29 NOTE — Assessment & Plan Note (Addendum)
 Point-of-care urinalysis positive for nitrites, leukocytes. On exam, well-appearing and afebrile.  No tachycardia. Cephalexin  500 mg 4 times daily for 7 days sent to pharmacy Urine culture sent, will follow-up in tailor antibiotics as appropriate based on sensitivities ER return precautions discussed should she have systemic signs of illness such as fever, inability to tolerate p.o., severe abdominal pain.

## 2024-05-29 NOTE — Progress Notes (Signed)
    SUBJECTIVE:   CHIEF COMPLAINT / HPI:   Nausea, loss of appetite, abdominal pain - Since Sunday, reports nausea and dark-colored urine - Pain in lower abdomen - Reports dark urine - Feels fatigued - No fever, vomiting, diarrhea - No hematuria  LMP 05/25/2024  PERTINENT  PMH / PSH:  Alcohol  withdrawal requiring hospitalization/ICU level of care Alcohol  use disorder Marijuana use Hx pyelonephritis  OBJECTIVE:   BP 90/60   Pulse 80   Ht 5\' 2"  (1.575 m)   Wt 141 lb (64 kg)   LMP 05/14/2024   BMI 25.79 kg/m   General: NAD, well appearing Cardiac: RRR Neuro: A&O Respiratory: normal WOB on RA. No wheezing or crackles on auscultation, good lung sounds throughout Extremities: Moving all 4 extremities equally    ASSESSMENT/PLAN:   UTI (urinary tract infection) Point-of-care urinalysis positive for nitrites, leukocytes. On exam, well-appearing and afebrile.  No tachycardia. Cephalexin  500 mg 4 times daily for 7 days sent to pharmacy Urine culture sent, will follow-up in tailor antibiotics as appropriate based on sensitivities ER return precautions discussed should she have systemic signs of illness such as fever, inability to tolerate p.o., severe abdominal pain.     Jomaira Darr, DO [facility]

## 2024-06-01 ENCOUNTER — Telehealth: Payer: Self-pay | Admitting: Student

## 2024-06-01 ENCOUNTER — Other Ambulatory Visit: Payer: Self-pay | Admitting: Student

## 2024-06-01 DIAGNOSIS — F419 Anxiety disorder, unspecified: Secondary | ICD-10-CM

## 2024-06-01 LAB — URINE CULTURE

## 2024-06-01 MED ORDER — HYDROXYZINE HCL 10 MG PO TABS: 10.0000 mg | ORAL_TABLET | Freq: Three times a day (TID) | ORAL | 0 refills | Status: DC | PRN

## 2024-06-01 NOTE — Telephone Encounter (Signed)
**  After Hours/ Emergency Line Call**  Received a page to call [phone] - [phone].  Patient: [name]  Caller: Self  Confirmed name & DOB of patient with caller  Subjective:  Patient requesting short refill of hydroxyzine  for anxiety, has appt scheduled for 6/12 and is requesting enough pills to make it to the appointment. Patient takes 10 mg TID.    Objective:  Observations: NAD    Assessment & Plan  KARLE DESROSIER is a 38 y.o. female with PMHx s/f anxiety who calls with the following complaints and concerns: of requesting refill for anxiety to make it to her appt on 06/07/24. Provided pt w/ short course of atarax  to make it to her appt.   -- Will forward to PCP.  Wilhemena Harbour, MD [facility] Residency, PGY-1

## 2024-06-04 ENCOUNTER — Other Ambulatory Visit: Payer: Self-pay

## 2024-06-04 ENCOUNTER — Other Ambulatory Visit (HOSPITAL_COMMUNITY): Payer: Self-pay

## 2024-06-04 MED ORDER — HYDROXYZINE HCL 10 MG PO TABS
10.0000 mg | ORAL_TABLET | Freq: Three times a day (TID) | ORAL | 0 refills | Status: AC | PRN
  Filled 2024-06-04: qty 21, 7d supply, fill #0

## 2024-06-05 ENCOUNTER — Other Ambulatory Visit (HOSPITAL_COMMUNITY): Payer: Self-pay

## 2024-06-05 MED ORDER — NITROFURANTOIN MONOHYD MACRO 100 MG PO CAPS
100.0000 mg | ORAL_CAPSULE | Freq: Two times a day (BID) | ORAL | 0 refills | Status: AC
  Filled 2024-06-05 (×2): qty 14, 7d supply, fill #0

## 2024-06-06 NOTE — Telephone Encounter (Signed)
 Patient returns call to nurse line. Advised of message per Dr. Dameron.   Discussed return/ED precautions.   Elsie Halo, RN

## 2024-06-07 ENCOUNTER — Ambulatory Visit: Admitting: Family Medicine

## 2024-06-11 ENCOUNTER — Other Ambulatory Visit: Payer: Self-pay | Admitting: Licensed Clinical Social Worker

## 2024-06-11 NOTE — Patient Outreach (Signed)
 "  Call cannot be completed as dialed"

## 2024-07-16 ENCOUNTER — Other Ambulatory Visit: Payer: Self-pay | Admitting: Licensed Clinical Social Worker

## 2024-07-17 NOTE — Patient Instructions (Signed)

## 2024-07-17 NOTE — Patient Outreach (Signed)
 Complex Care Management   Visit Note  07/17/2024  Name:  [name] MRN: [id number] DOB: [birth year]  Situation: Referral received for Complex Care Management related to SDOH Barriers:  Financial Resource Strain I obtained verbal consent from Patient.  Visit completed with L Gallop   on the phone Pt encouraged to contact housing resources and [place] [employer]  Background:   Past Medical History:  Diagnosis Date   Abdominal pain, epigastric 04/17/2013   Adjustment disorder with emotional disturbance    Alcoholic gastritis 03/2013   hospitalization   Dehydration 04/04/2013   Hematemesis 03/2013   along with alcoholic gastritis, hospitalization   Hematemesis- mild 04/04/2013   Intractable nausea, vomiting & abdominal pain. 04/04/2013   Metabolic acidosis 03/2013   hospitaliation due to alcohol  abuse, dehyration, nausea/vomiting   Metabolic acidosis, increased anion gap 04/04/2013   Polysubstance abuse (HCC)    Pyelonephritis 03/2010   hospitalization   Seizures (HCC)    Stomach ulcer    never had any tests to confirm this diagni=oses   Tobacco use 04/04/2013    Assessment: Patient Reported Symptoms:  Cognitive Cognitive Status: Able to follow simple commands, Normal speech and language skills, Alert and oriented to person, place, and time Cognitive/Intellectual Conditions Management [RPT]: None reported or documented in medical history or problem list      Neurological Neurological Review of Symptoms: No symptoms reported    HEENT HEENT Symptoms Reported: No symptoms reported      Cardiovascular Cardiovascular Symptoms Reported: No symptoms reported    Respiratory      Endocrine Endocrine Symptoms Reported: No symptoms reported Is patient diabetic?: No    Gastrointestinal Gastrointestinal Symptoms Reported: No symptoms reported      Genitourinary Genitourinary Symptoms Reported: No symptoms reported    Integumentary Integumentary Symptoms Reported: No symptoms  reported    Musculoskeletal Musculoskelatal Symptoms Reviewed: No symptoms reported   Falls in the past year?: No Number of falls in past year: 1 or less Was there an injury with Fall?: No Fall Risk Category Calculator: 0 Patient Fall Risk Level: Low Fall Risk Patient at Risk for Falls Due to: No Fall Risks  Psychosocial       Quality of Family Relationships: supportive Do you feel physically threatened by others?: No      05/29/2024   11:27 AM  Depression screen PHQ 2/9  Decreased Interest 1  Down, Depressed, Hopeless 0  PHQ - 2 Score 1  Altered sleeping 2  Tired, decreased energy 2  Change in appetite 3  Feeling bad or failure about yourself  0  Trouble concentrating 0  Moving slowly or fidgety/restless 0  Suicidal thoughts 0  PHQ-9 Score 8    There were no vitals filed for this visit.  Medications Reviewed Today     Reviewed by Clement Odor, LCSW (Social Worker) on 07/17/24 at 1204  Med List Status: <None>   Medication Order Taking? Sig Documenting Provider Last Dose Status Informant  ferrous sulfate  325 (65 FE) MG tablet [id number]  Take 1 tablet (325 mg total) by mouth every other day. Joshua Domino, DO  Active     Discontinued 07/06/20 1509   hydrOXYzine  (ATARAX ) 10 MG tablet [id number]  Take 1 tablet (10 mg total) by mouth 3 (three) times daily as needed. Jennelle Riis, MD  Active   hydrOXYzine  (ATARAX ) 10 MG tablet [id number]  Take 1 tablet (10 mg total) by mouth 3 (three) times daily as needed.   Active   naltrexone  (DEPADE) 50  MG tablet [id number]  Take 1 tablet (50 mg total) by mouth daily. Joshua Domino, DO  Active   Patient not taking:  Discontinued 07/06/20 1509 ondansetron  (ZOFRAN -ODT) 4 MG disintegrating tablet [id number] No Take 1 tablet (4 mg total) by mouth every 8 (eight) hours as needed for up to 20 doses for nausea or vomiting. Nicholas Bar, MD Past Week Active Self  potassium chloride  SA (KLOR-CON  M) 20 MEQ tablet [id number]  Take 2 tablets (40 mEq  total) by mouth daily as needed. Only take as directed by your doctor. Elicia Hamlet, MD  Active   Prenatal Vit-Fe Fumarate-FA (PRENATAL VITAMINS) 28-0.8 MG TABS [id number]  Take 1 tablet by mouth daily. Joshua Domino, DO  Active   sertraline  (ZOLOFT ) 50 MG tablet [id number]  Take 1 tablet (50 mg total) by mouth daily. Joshua Domino, DO  Active             Recommendation:   Pt will contact housing and employment.  Follow Up Plan:   Patient has met all care management goals. Care Management case will be closed. Patient has been provided contact information should new needs arise.   Alfonso Rummer MSW, LCSW Licensed Clinical Social Worker  [facility], Population Health Direct Dial: [phone]  Fax: [phone]

## 2024-12-13 ENCOUNTER — Telehealth: Payer: Self-pay

## 2024-12-13 NOTE — Telephone Encounter (Signed)
 Patient calls nurse line requesting a refill on Hydroxyzine , Naltrexone  and Sertraline .   Advised she missed her FU with PCP and she will need to be seen for medication refills.   Patient scheduled for tomorrow.   She is requesting Hydroxyzine  to be refilled today if possible.   Will forward to PCP.

## 2024-12-14 ENCOUNTER — Other Ambulatory Visit (HOSPITAL_COMMUNITY): Payer: Self-pay

## 2024-12-14 ENCOUNTER — Ambulatory Visit: Payer: Self-pay

## 2024-12-14 VITALS — BP 106/69 | HR 75 | Ht 62.0 in | Wt 160.4 lb

## 2024-12-14 DIAGNOSIS — F1029 Alcohol dependence with unspecified alcohol-induced disorder: Secondary | ICD-10-CM

## 2024-12-14 DIAGNOSIS — F419 Anxiety disorder, unspecified: Secondary | ICD-10-CM | POA: Diagnosis not present

## 2024-12-14 DIAGNOSIS — F32A Depression, unspecified: Secondary | ICD-10-CM

## 2024-12-14 MED ORDER — NALTREXONE HCL 50 MG PO TABS
50.0000 mg | ORAL_TABLET | Freq: Every day | ORAL | 2 refills | Status: AC
  Filled 2024-12-14: qty 30, 30d supply, fill #0

## 2024-12-14 MED ORDER — HYDROXYZINE HCL 10 MG PO TABS
10.0000 mg | ORAL_TABLET | Freq: Three times a day (TID) | ORAL | 0 refills | Status: AC | PRN
  Filled 2024-12-14: qty 21, 7d supply, fill #0

## 2024-12-14 MED ORDER — SERTRALINE HCL 50 MG PO TABS
50.0000 mg | ORAL_TABLET | Freq: Every day | ORAL | 1 refills | Status: AC
  Filled 2024-12-14: qty 30, 30d supply, fill #0

## 2024-12-14 NOTE — Assessment & Plan Note (Signed)
 Well controlled on naltrexone , refilled today.

## 2024-12-14 NOTE — Telephone Encounter (Signed)
 Patient calls nurse line requesting a refill on Hydroxyzine .   She reports she just left the office, however Hydroxyzine  was not sent in.   Advised will forward to provider who saw patient this morning.

## 2024-12-14 NOTE — Progress Notes (Cosign Needed)
" ° ° °  SUBJECTIVE:   CHIEF COMPLAINT / HPI:   Medication management She has previously been on sertraline  50 mg and hydroxyzine  10 mg PRN for anxiety/depression. Previously on naltrexone  to address alcohol  cravings. She has is out of these and needs refills.  Recent loss of grandmother last week, though expected, has been traumatic for patient and family.  PERTINENT  PMH / PSH: Reviewed. Alcohol  withdrawal requiring hospitalization/ICU level of care Alcohol  use disorder Marijuana use Anxiety  OBJECTIVE:   BP 106/69   Pulse 75   Ht 5' 2 (1.575 m)   Wt 160 lb 6.4 oz (72.8 kg)   LMP 12/05/2024   SpO2 99%   BMI 29.34 kg/m   General: well-appearing, no acute distress. HEENT: normocephalic, PERRLA, EOM grossly intact, MMM Cardio: Regular rate, regular rhythm, no murmurs on exam. Pulm: Clear, no wheezing, no crackles. No increased work of breathing. Extremities: Moves all extremities equally. Neuro: Alert and oriented x3, speech normal in content, no facial asymmetry. Psych:  Cognition and judgment appear intact. Alert, communicative, and cooperative. Somewhat tearful when discussing loss of her grandmother.   ASSESSMENT/PLAN:   Assessment & Plan Anxiety and depression Well managed with current regimen. Refill provided for sertraline . She already has hydroxyzine  refills. 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     Lauraine Norse, DO [facility] "

## 2024-12-14 NOTE — Patient Instructions (Signed)
 Refills sent in to pharmacy.  Keep your follow up appointment scheduled for 12/17/24.

## 2024-12-17 ENCOUNTER — Encounter

## 2024-12-17 NOTE — Progress Notes (Deleted)
" ° ° °  SUBJECTIVE:   CHIEF COMPLAINT / HPI:   Yearly Physical - Seen last week for alcohol  use disorder, depression, GAD. Started on zoloft , continued on naltrexone    PERTINENT  PMH / PSH: ***  OBJECTIVE:   LMP 12/05/2024   ***  ASSESSMENT/PLAN:   Assessment & Plan Well woman exam  Overweight (BMI 25.0-29.9)  GAD (generalized anxiety disorder)  Alcohol  use disorder, moderate, in early remission, dependence (HCC)      Milda LITTIE Deed, MD [facility] "

## 2024-12-26 ENCOUNTER — Other Ambulatory Visit (HOSPITAL_COMMUNITY): Payer: Self-pay

## 2024-12-31 ENCOUNTER — Encounter: Admitting: Family Medicine
